# Patient Record
Sex: Male | Born: 1955 | Race: Black or African American | Hispanic: No | Marital: Single | Smoking: Former smoker
Health system: Southern US, Community
[De-identification: ages and names within clinical notes are randomized; demographics above are authoritative.]

## PROBLEM LIST (undated history)

## (undated) DIAGNOSIS — I10 Essential (primary) hypertension: Secondary | ICD-10-CM

## (undated) DIAGNOSIS — K219 Gastro-esophageal reflux disease without esophagitis: Secondary | ICD-10-CM

## (undated) DIAGNOSIS — E785 Hyperlipidemia, unspecified: Secondary | ICD-10-CM

## (undated) DIAGNOSIS — I059 Rheumatic mitral valve disease, unspecified: Secondary | ICD-10-CM

## (undated) DIAGNOSIS — E039 Hypothyroidism, unspecified: Secondary | ICD-10-CM

## (undated) DIAGNOSIS — I330 Acute and subacute infective endocarditis: Secondary | ICD-10-CM

## (undated) DIAGNOSIS — I519 Heart disease, unspecified: Secondary | ICD-10-CM

## (undated) DIAGNOSIS — C801 Malignant (primary) neoplasm, unspecified: Secondary | ICD-10-CM

## (undated) HISTORY — PX: MEDIAN STERNOTOMY: SUR860

---

## 2004-10-11 ENCOUNTER — Ambulatory Visit: Payer: Self-pay | Admitting: Urology

## 2005-11-04 ENCOUNTER — Ambulatory Visit: Payer: Self-pay | Admitting: Gastroenterology

## 2009-02-17 ENCOUNTER — Encounter: Payer: Self-pay | Admitting: Physical Medicine and Rehabilitation

## 2010-04-18 DIAGNOSIS — I059 Rheumatic mitral valve disease, unspecified: Secondary | ICD-10-CM

## 2010-04-18 DIAGNOSIS — I330 Acute and subacute infective endocarditis: Secondary | ICD-10-CM

## 2010-04-18 HISTORY — DX: Acute and subacute infective endocarditis: I33.0

## 2010-04-18 HISTORY — PX: MITRAL VALVE REPLACEMENT: SHX147

## 2010-04-18 HISTORY — DX: Rheumatic mitral valve disease, unspecified: I05.9

## 2011-03-23 ENCOUNTER — Other Ambulatory Visit: Payer: Self-pay | Admitting: Geriatric Medicine

## 2017-09-04 ENCOUNTER — Inpatient Hospital Stay
Admission: EM | Admit: 2017-09-04 | Discharge: 2017-09-04 | DRG: 064 | Disposition: A | Discharge status: Other Acute Inpatient Hospital | Payer: Medicare HMO | Attending: Internal Medicine | Admitting: Internal Medicine

## 2017-09-04 ENCOUNTER — Inpatient Hospital Stay: Payer: Medicare HMO

## 2017-09-04 ENCOUNTER — Inpatient Hospital Stay (HOSPITAL_COMMUNITY)
Admission: AD | Admit: 2017-09-04 | Discharge: 2017-09-15 | DRG: 064 | Disposition: A | Discharge status: Skilled Nursing Facility | Payer: Medicare HMO | Source: Other Acute Inpatient Hospital | Attending: Neurology | Admitting: Neurology

## 2017-09-04 ENCOUNTER — Inpatient Hospital Stay (HOSPITAL_COMMUNITY): Payer: Medicare HMO

## 2017-09-04 ENCOUNTER — Emergency Department: Payer: Medicare HMO

## 2017-09-04 ENCOUNTER — Encounter: Payer: Self-pay | Admitting: Internal Medicine

## 2017-09-04 DIAGNOSIS — R1313 Dysphagia, pharyngeal phase: Secondary | ICD-10-CM | POA: Diagnosis present

## 2017-09-04 DIAGNOSIS — R0682 Tachypnea, not elsewhere classified: Secondary | ICD-10-CM | POA: Diagnosis not present

## 2017-09-04 DIAGNOSIS — I10 Essential (primary) hypertension: Secondary | ICD-10-CM | POA: Diagnosis present

## 2017-09-04 DIAGNOSIS — E870 Hyperosmolality and hypernatremia: Secondary | ICD-10-CM | POA: Diagnosis present

## 2017-09-04 DIAGNOSIS — R1311 Dysphagia, oral phase: Secondary | ICD-10-CM | POA: Diagnosis present

## 2017-09-04 DIAGNOSIS — G9349 Other encephalopathy: Secondary | ICD-10-CM | POA: Diagnosis present

## 2017-09-04 DIAGNOSIS — T17908A Unspecified foreign body in respiratory tract, part unspecified causing other injury, initial encounter: Secondary | ICD-10-CM | POA: Diagnosis not present

## 2017-09-04 DIAGNOSIS — J9600 Acute respiratory failure, unspecified whether with hypoxia or hypercapnia: Secondary | ICD-10-CM | POA: Diagnosis not present

## 2017-09-04 DIAGNOSIS — G935 Compression of brain: Secondary | ICD-10-CM | POA: Diagnosis present

## 2017-09-04 DIAGNOSIS — Z4659 Encounter for fitting and adjustment of other gastrointestinal appliance and device: Secondary | ICD-10-CM | POA: Diagnosis not present

## 2017-09-04 DIAGNOSIS — Z720 Tobacco use: Secondary | ICD-10-CM

## 2017-09-04 DIAGNOSIS — E039 Hypothyroidism, unspecified: Secondary | ICD-10-CM | POA: Diagnosis not present

## 2017-09-04 DIAGNOSIS — R29719 NIHSS score 19: Secondary | ICD-10-CM | POA: Diagnosis not present

## 2017-09-04 DIAGNOSIS — R29810 Facial weakness: Secondary | ICD-10-CM | POA: Diagnosis present

## 2017-09-04 DIAGNOSIS — R569 Unspecified convulsions: Secondary | ICD-10-CM | POA: Diagnosis present

## 2017-09-04 DIAGNOSIS — Z952 Presence of prosthetic heart valve: Secondary | ICD-10-CM | POA: Diagnosis not present

## 2017-09-04 DIAGNOSIS — R001 Bradycardia, unspecified: Secondary | ICD-10-CM

## 2017-09-04 DIAGNOSIS — K219 Gastro-esophageal reflux disease without esophagitis: Secondary | ICD-10-CM | POA: Diagnosis present

## 2017-09-04 DIAGNOSIS — I6502 Occlusion and stenosis of left vertebral artery: Secondary | ICD-10-CM | POA: Diagnosis not present

## 2017-09-04 DIAGNOSIS — R059 Cough, unspecified: Secondary | ICD-10-CM

## 2017-09-04 DIAGNOSIS — E785 Hyperlipidemia, unspecified: Secondary | ICD-10-CM | POA: Diagnosis present

## 2017-09-04 DIAGNOSIS — Z01818 Encounter for other preprocedural examination: Secondary | ICD-10-CM

## 2017-09-04 DIAGNOSIS — Z79899 Other long term (current) drug therapy: Secondary | ICD-10-CM | POA: Diagnosis not present

## 2017-09-04 DIAGNOSIS — I2510 Atherosclerotic heart disease of native coronary artery without angina pectoris: Secondary | ICD-10-CM | POA: Diagnosis present

## 2017-09-04 DIAGNOSIS — I639 Cerebral infarction, unspecified: Principal | ICD-10-CM

## 2017-09-04 DIAGNOSIS — I5189 Other ill-defined heart diseases: Secondary | ICD-10-CM

## 2017-09-04 DIAGNOSIS — I6340 Cerebral infarction due to embolism of unspecified cerebral artery: Secondary | ICD-10-CM | POA: Diagnosis present

## 2017-09-04 DIAGNOSIS — R05 Cough: Secondary | ICD-10-CM

## 2017-09-04 DIAGNOSIS — E440 Moderate protein-calorie malnutrition: Secondary | ICD-10-CM

## 2017-09-04 DIAGNOSIS — Z452 Encounter for adjustment and management of vascular access device: Secondary | ICD-10-CM

## 2017-09-04 DIAGNOSIS — E876 Hypokalemia: Secondary | ICD-10-CM | POA: Diagnosis present

## 2017-09-04 DIAGNOSIS — G8191 Hemiplegia, unspecified affecting right dominant side: Secondary | ICD-10-CM | POA: Diagnosis not present

## 2017-09-04 DIAGNOSIS — J9601 Acute respiratory failure with hypoxia: Secondary | ICD-10-CM | POA: Diagnosis present

## 2017-09-04 DIAGNOSIS — N179 Acute kidney failure, unspecified: Secondary | ICD-10-CM | POA: Diagnosis not present

## 2017-09-04 DIAGNOSIS — H5500 Unspecified nystagmus: Secondary | ICD-10-CM | POA: Diagnosis present

## 2017-09-04 DIAGNOSIS — D72829 Elevated white blood cell count, unspecified: Secondary | ICD-10-CM | POA: Diagnosis not present

## 2017-09-04 DIAGNOSIS — J9690 Respiratory failure, unspecified, unspecified whether with hypoxia or hypercapnia: Secondary | ICD-10-CM

## 2017-09-04 DIAGNOSIS — I6312 Cerebral infarction due to embolism of basilar artery: Secondary | ICD-10-CM | POA: Diagnosis not present

## 2017-09-04 DIAGNOSIS — J690 Pneumonitis due to inhalation of food and vomit: Secondary | ICD-10-CM | POA: Diagnosis not present

## 2017-09-04 DIAGNOSIS — H5010 Unspecified exotropia: Secondary | ICD-10-CM | POA: Diagnosis not present

## 2017-09-04 DIAGNOSIS — Z7982 Long term (current) use of aspirin: Secondary | ICD-10-CM

## 2017-09-04 DIAGNOSIS — G936 Cerebral edema: Secondary | ICD-10-CM

## 2017-09-04 DIAGNOSIS — R29722 NIHSS score 22: Secondary | ICD-10-CM | POA: Diagnosis present

## 2017-09-04 DIAGNOSIS — I63441 Cerebral infarction due to embolism of right cerebellar artery: Secondary | ICD-10-CM

## 2017-09-04 DIAGNOSIS — R292 Abnormal reflex: Secondary | ICD-10-CM | POA: Diagnosis not present

## 2017-09-04 DIAGNOSIS — R739 Hyperglycemia, unspecified: Secondary | ICD-10-CM | POA: Diagnosis not present

## 2017-09-04 DIAGNOSIS — I63442 Cerebral infarction due to embolism of left cerebellar artery: Secondary | ICD-10-CM | POA: Diagnosis not present

## 2017-09-04 HISTORY — DX: Gastro-esophageal reflux disease without esophagitis: K21.9

## 2017-09-04 HISTORY — DX: Heart disease, unspecified: I51.9

## 2017-09-04 HISTORY — DX: Hyperlipidemia, unspecified: E78.5

## 2017-09-04 HISTORY — DX: Essential (primary) hypertension: I10

## 2017-09-04 HISTORY — DX: Rheumatic mitral valve disease, unspecified: I05.9

## 2017-09-04 HISTORY — DX: Hypothyroidism, unspecified: E03.9

## 2017-09-04 HISTORY — DX: Acute and subacute infective endocarditis: I33.0

## 2017-09-04 LAB — URINE DRUG SCREEN, QUALITATIVE (ARMC ONLY)
Amphetamines, Ur Screen: NOT DETECTED
Barbiturates, Ur Screen: NOT DETECTED
Benzodiazepine, Ur Scrn: POSITIVE — AB
CANNABINOID 50 NG, UR ~~LOC~~: NOT DETECTED
COCAINE METABOLITE, UR ~~LOC~~: NOT DETECTED
MDMA (ECSTASY) UR SCREEN: NOT DETECTED
Methadone Scn, Ur: NOT DETECTED
Opiate, Ur Screen: NOT DETECTED
Phencyclidine (PCP) Ur S: NOT DETECTED
TRICYCLIC, UR SCREEN: NOT DETECTED

## 2017-09-04 LAB — CBC WITH DIFFERENTIAL/PLATELET
ABS IMMATURE GRANULOCYTES: 0.1 10*3/uL (ref 0.0–0.1)
BASOS PCT: 1 %
BASOS PCT: 0 %
Basophils Absolute: 0.1 10*3/uL (ref 0–0.1)
Basophils Absolute: 0 10*3/uL (ref 0.0–0.1)
EOS ABS: 0 10*3/uL (ref 0.0–0.7)
Eosinophils Absolute: 0 10*3/uL (ref 0–0.7)
Eosinophils Relative: 0 %
Eosinophils Relative: 0 %
HCT: 46.5 % (ref 39.0–52.0)
HEMATOCRIT: 50.2 % (ref 40.0–52.0)
HEMOGLOBIN: 16.9 g/dL (ref 13.0–18.0)
Hemoglobin: 15.5 g/dL (ref 13.0–17.0)
IMMATURE GRANULOCYTES: 0 %
LYMPHS ABS: 1.5 10*3/uL (ref 1.0–3.6)
LYMPHS ABS: 1.3 10*3/uL (ref 0.7–4.0)
Lymphocytes Relative: 11 %
Lymphocytes Relative: 9 %
MCH: 30 pg (ref 26.0–34.0)
MCH: 29.6 pg (ref 26.0–34.0)
MCHC: 33.6 g/dL (ref 32.0–36.0)
MCHC: 33.3 g/dL (ref 30.0–36.0)
MCV: 89.2 fL (ref 80.0–100.0)
MCV: 88.7 fL (ref 78.0–100.0)
MONOS PCT: 9 %
Monocytes Absolute: 1.2 10*3/uL — ABNORMAL HIGH (ref 0.2–1.0)
Monocytes Absolute: 1.7 10*3/uL — ABNORMAL HIGH (ref 0.1–1.0)
Monocytes Relative: 11 %
NEUTROS ABS: 11 10*3/uL — AB (ref 1.4–6.5)
NEUTROS ABS: 11.5 10*3/uL — AB (ref 1.7–7.7)
NEUTROS PCT: 79 %
NEUTROS PCT: 80 %
PLATELETS: 156 10*3/uL (ref 150–400)
Platelets: 162 10*3/uL (ref 150–440)
RBC: 5.62 MIL/uL (ref 4.40–5.90)
RBC: 5.24 MIL/uL (ref 4.22–5.81)
RDW: 14.5 % (ref 11.5–14.5)
RDW: 14.7 % (ref 11.5–15.5)
WBC: 13.7 10*3/uL — ABNORMAL HIGH (ref 3.8–10.6)
WBC: 14.6 10*3/uL — AB (ref 4.0–10.5)

## 2017-09-04 LAB — GLUCOSE, CAPILLARY
GLUCOSE-CAPILLARY: 119 mg/dL — AB (ref 65–99)
Glucose-Capillary: 108 mg/dL — ABNORMAL HIGH (ref 65–99)
Glucose-Capillary: 111 mg/dL — ABNORMAL HIGH (ref 65–99)

## 2017-09-04 LAB — COMPREHENSIVE METABOLIC PANEL
ALBUMIN: 4.1 g/dL (ref 3.5–5.0)
ALK PHOS: 60 U/L (ref 38–126)
ALK PHOS: 59 U/L (ref 38–126)
ALT: 24 U/L (ref 17–63)
ALT: 24 U/L (ref 17–63)
ANION GAP: 14 (ref 5–15)
ANION GAP: 11 (ref 5–15)
AST: 36 U/L (ref 15–41)
AST: 44 U/L — ABNORMAL HIGH (ref 15–41)
Albumin: 3.5 g/dL (ref 3.5–5.0)
BILIRUBIN TOTAL: 1.3 mg/dL — AB (ref 0.3–1.2)
BILIRUBIN TOTAL: 1.1 mg/dL (ref 0.3–1.2)
BUN: 24 mg/dL — AB (ref 6–20)
BUN: 20 mg/dL (ref 6–20)
CALCIUM: 9.4 mg/dL (ref 8.9–10.3)
CALCIUM: 8.8 mg/dL — AB (ref 8.9–10.3)
CO2: 24 mmol/L (ref 22–32)
CO2: 24 mmol/L (ref 22–32)
CREATININE: 1.28 mg/dL — AB (ref 0.61–1.24)
CREATININE: 1.26 mg/dL — AB (ref 0.61–1.24)
Chloride: 105 mmol/L (ref 101–111)
Chloride: 111 mmol/L (ref 101–111)
GFR calc Af Amer: >60 mL/min (ref >60)
GFR calc non Af Amer: 58 mL/min — ABNORMAL LOW (ref >60)
GFR calc non Af Amer: 59 mL/min — ABNORMAL LOW (ref >60)
GLUCOSE: 104 mg/dL — AB (ref 65–99)
Glucose, Bld: 122 mg/dL — ABNORMAL HIGH (ref 65–99)
Potassium: 3.4 mmol/L — ABNORMAL LOW (ref 3.5–5.1)
Potassium: 3.5 mmol/L (ref 3.5–5.1)
Sodium: 143 mmol/L (ref 135–145)
Sodium: 146 mmol/L — ABNORMAL HIGH (ref 135–145)
TOTAL PROTEIN: 6.9 g/dL (ref 6.5–8.1)
Total Protein: 5.8 g/dL — ABNORMAL LOW (ref 6.5–8.1)

## 2017-09-04 LAB — LACTIC ACID, PLASMA
Lactic Acid, Venous: 1.5 mmol/L (ref 0.5–1.9)
Lactic Acid, Venous: 1.5 mmol/L (ref 0.5–1.9)

## 2017-09-04 LAB — APTT: aPTT: 24 seconds (ref 24–36)

## 2017-09-04 LAB — URINALYSIS, COMPLETE (UACMP) WITH MICROSCOPIC
BACTERIA UA: NONE SEEN
Bilirubin Urine: NEGATIVE
GLUCOSE, UA: NEGATIVE mg/dL
Ketones, ur: 5 mg/dL — AB
LEUKOCYTES UA: NEGATIVE
Nitrite: NEGATIVE
Protein, ur: NEGATIVE mg/dL
SQUAMOUS EPITHELIAL / LPF: NONE SEEN (ref 0–5)
Specific Gravity, Urine: 1.015 (ref 1.005–1.030)
pH: 6 (ref 5.0–8.0)

## 2017-09-04 LAB — HEMOGLOBIN A1C
Hgb A1c MFr Bld: 5.6 % (ref 4.8–5.6)
Mean Plasma Glucose: 114.02 mg/dL

## 2017-09-04 LAB — POCT I-STAT 3, ART BLOOD GAS (G3+)
Bicarbonate: 24.6 mmol/L (ref 20.0–28.0)
O2 Saturation: 98 %
PCO2 ART: 40.7 mmHg (ref 32.0–48.0)
PO2 ART: 108 mmHg (ref 83.0–108.0)
Patient temperature: 98.2
TCO2: 26 mmol/L (ref 22–32)
pH, Arterial: 7.389 (ref 7.350–7.450)

## 2017-09-04 LAB — PROTIME-INR
INR: 0.94
PROTHROMBIN TIME: 12.5 s (ref 11.4–15.2)

## 2017-09-04 LAB — PHOSPHORUS
PHOSPHORUS: 3.3 mg/dL (ref 2.5–4.6)
Phosphorus: 4.3 mg/dL (ref 2.5–4.6)

## 2017-09-04 LAB — TROPONIN I
Troponin I: <0.03 ng/mL (ref <0.03)
Troponin I: <0.03 ng/mL (ref <0.03)

## 2017-09-04 LAB — SALICYLATE LEVEL

## 2017-09-04 LAB — BLOOD GAS, ARTERIAL
ACID-BASE DEFICIT: 0.2 mmol/L (ref 0.0–2.0)
BICARBONATE: 25.4 mmol/L (ref 20.0–28.0)
FIO2: 0.4
MECHVT: 450 mL
Mechanical Rate: 20
O2 Saturation: 97.9 %
PEEP: 5 cmH2O
PH ART: 7.37 (ref 7.350–7.450)
Patient temperature: 37
pCO2 arterial: 44 mmHg (ref 32.0–48.0)
pO2, Arterial: 106 mmHg (ref 83.0–108.0)

## 2017-09-04 LAB — MAGNESIUM
Magnesium: 1.6 mg/dL — ABNORMAL LOW (ref 1.7–2.4)
Magnesium: 2.6 mg/dL — ABNORMAL HIGH (ref 1.7–2.4)

## 2017-09-04 LAB — MRSA PCR SCREENING: MRSA BY PCR: NEGATIVE

## 2017-09-04 LAB — ETHANOL: Alcohol, Ethyl (B): <10 mg/dL (ref <10)

## 2017-09-04 LAB — ACETAMINOPHEN LEVEL: Acetaminophen (Tylenol), Serum: <10 ug/mL — ABNORMAL LOW (ref 10–30)

## 2017-09-04 LAB — SODIUM: Sodium: 142 mmol/L (ref 135–145)

## 2017-09-04 LAB — PROCALCITONIN: Procalcitonin: <0.1 ng/mL

## 2017-09-04 MED ORDER — SODIUM CHLORIDE 3 % IV SOLN
INTRAVENOUS | Status: DC
  Filled 2017-09-04 (×3): qty 500

## 2017-09-04 MED ORDER — MAGNESIUM SULFATE 4 GM/100ML IV SOLN
4.0000 g | Freq: Once | INTRAVENOUS | Status: AC
  Administered 2017-09-04: 4 g via INTRAVENOUS
  Filled 2017-09-04: qty 100

## 2017-09-04 MED ORDER — FENTANYL 2500MCG IN NS 250ML (10MCG/ML) PREMIX INFUSION: 25.0000 ug/h | INTRAVENOUS | Status: DC

## 2017-09-04 MED ORDER — ACETAMINOPHEN 650 MG RE SUPP: 650.0000 mg | RECTAL | Status: DC | PRN

## 2017-09-04 MED ORDER — FREE WATER: 100.0000 mL | Freq: Four times a day (QID) | Status: DC

## 2017-09-04 MED ORDER — MIDAZOLAM HCL 2 MG/2ML IJ SOLN
2.0000 mg | INTRAMUSCULAR | Status: DC | PRN
  Administered 2017-09-04 – 2017-09-05 (×2): 2 mg via INTRAVENOUS
  Filled 2017-09-04 (×2): qty 2

## 2017-09-04 MED ORDER — FAMOTIDINE 40 MG/5ML PO SUSR
20.0000 mg | Freq: Two times a day (BID) | ORAL | Status: DC
  Administered 2017-09-04 – 2017-09-06 (×4): 20 mg
  Filled 2017-09-04 (×5): qty 2.5

## 2017-09-04 MED ORDER — CHLORHEXIDINE GLUCONATE 0.12% ORAL RINSE (MEDLINE KIT)
15.0000 mL | Freq: Two times a day (BID) | OROMUCOSAL | Status: DC
  Administered 2017-09-04: 15 mL via OROMUCOSAL

## 2017-09-04 MED ORDER — ACETAMINOPHEN 325 MG PO TABS: 650.0000 mg | ORAL_TABLET | ORAL | Status: DC | PRN

## 2017-09-04 MED ORDER — VITAL HIGH PROTEIN PO LIQD: 1000.0000 mL | ORAL | Status: DC

## 2017-09-04 MED ORDER — CHLORHEXIDINE GLUCONATE 0.12% ORAL RINSE (MEDLINE KIT)
15.0000 mL | Freq: Two times a day (BID) | OROMUCOSAL | Status: DC
  Administered 2017-09-04 – 2017-09-15 (×22): 15 mL via OROMUCOSAL

## 2017-09-04 MED ORDER — MIDAZOLAM HCL 2 MG/2ML IJ SOLN
2.0000 mg | INTRAMUSCULAR | Status: DC | PRN
  Administered 2017-09-06 – 2017-09-07 (×2): 2 mg via INTRAVENOUS
  Filled 2017-09-04 (×2): qty 2

## 2017-09-04 MED ORDER — ACETAMINOPHEN 160 MG/5ML PO SOLN
650.0000 mg | ORAL | Status: DC | PRN
  Filled 2017-09-04: qty 20.3

## 2017-09-04 MED ORDER — ORAL CARE MOUTH RINSE
15.0000 mL | OROMUCOSAL | Status: DC
  Administered 2017-09-04 – 2017-09-08 (×39): 15 mL via OROMUCOSAL

## 2017-09-04 MED ORDER — ONDANSETRON HCL 4 MG/2ML IJ SOLN
4.0000 mg | Freq: Four times a day (QID) | INTRAMUSCULAR | Status: DC | PRN
  Administered 2017-09-11: 4 mg via INTRAVENOUS
  Filled 2017-09-04: qty 2

## 2017-09-04 MED ORDER — ORAL CARE MOUTH RINSE
15.0000 mL | OROMUCOSAL | Status: DC
  Administered 2017-09-04 (×2): 15 mL via OROMUCOSAL

## 2017-09-04 MED ORDER — SENNOSIDES-DOCUSATE SODIUM 8.6-50 MG PO TABS
1.0000 | ORAL_TABLET | Freq: Every evening | ORAL | Status: DC | PRN
  Administered 2017-09-06: 1 via ORAL
  Filled 2017-09-04: qty 1

## 2017-09-04 MED ORDER — SODIUM CHLORIDE 3 % IV SOLN
INTRAVENOUS | Status: AC
  Administered 2017-09-04 – 2017-09-05 (×3): 75 mL/h via INTRAVENOUS
  Administered 2017-09-05: 50 mL/h via INTRAVENOUS
  Filled 2017-09-04 (×5): qty 500

## 2017-09-04 MED ORDER — SODIUM CHLORIDE 0.9 % IV SOLN: INTRAVENOUS | Status: DC

## 2017-09-04 MED ORDER — ADULT MULTIVITAMIN LIQUID CH
15.0000 mL | Freq: Every day | ORAL | Status: DC
  Administered 2017-09-04: 15 mL
  Filled 2017-09-04: qty 15

## 2017-09-04 MED ORDER — SODIUM CHLORIDE 0.9 % IV SOLN
INTRAVENOUS | Status: DC
  Administered 2017-09-04: 09:00:00 via INTRAVENOUS

## 2017-09-04 MED ORDER — FENTANYL BOLUS VIA INFUSION
50.0000 ug | INTRAVENOUS | Status: DC | PRN
  Filled 2017-09-04: qty 50

## 2017-09-04 MED ORDER — FAMOTIDINE 20 MG PO TABS
20.0000 mg | ORAL_TABLET | Freq: Two times a day (BID) | ORAL | Status: DC
  Administered 2017-09-04: 20 mg
  Filled 2017-09-04: qty 1

## 2017-09-04 MED ORDER — SODIUM CHLORIDE 0.9 % IV SOLN: 250.0000 mL | INTRAVENOUS | Status: DC | PRN

## 2017-09-04 MED ORDER — ORAL CARE MOUTH RINSE
15.0000 mL | OROMUCOSAL | Status: DC
  Administered 2017-09-04: 15 mL via OROMUCOSAL

## 2017-09-04 MED ORDER — KETAMINE HCL 10 MG/ML IJ SOLN
150.0000 mg | Freq: Once | INTRAMUSCULAR | Status: AC
  Administered 2017-09-04: 150 mg via INTRAVENOUS

## 2017-09-04 MED ORDER — DOCUSATE SODIUM 50 MG/5ML PO LIQD
100.0000 mg | Freq: Two times a day (BID) | ORAL | Status: DC
  Administered 2017-09-04 – 2017-09-11 (×12): 100 mg
  Filled 2017-09-04 (×13): qty 10

## 2017-09-04 MED ORDER — LEVETIRACETAM IN NACL 1000 MG/100ML IV SOLN
1000.0000 mg | Freq: Two times a day (BID) | INTRAVENOUS | Status: DC
  Filled 2017-09-04 (×2): qty 100

## 2017-09-04 MED ORDER — ACETAMINOPHEN 160 MG/5ML PO SOLN
650.0000 mg | ORAL | Status: DC | PRN
  Administered 2017-09-06 – 2017-09-09 (×2): 650 mg
  Filled 2017-09-04 (×2): qty 20.3

## 2017-09-04 MED ORDER — WARFARIN - PHARMACIST DOSING INPATIENT: Freq: Every day | Status: DC

## 2017-09-04 MED ORDER — SUCCINYLCHOLINE CHLORIDE 20 MG/ML IJ SOLN
120.0000 mg | Freq: Once | INTRAMUSCULAR | Status: AC
  Administered 2017-09-04: 120 mg via INTRAVENOUS

## 2017-09-04 MED ORDER — ACETAMINOPHEN 325 MG PO TABS
650.0000 mg | ORAL_TABLET | ORAL | Status: DC | PRN
  Filled 2017-09-04: qty 2

## 2017-09-04 MED ORDER — DOCUSATE SODIUM 50 MG/5ML PO LIQD: 100.0000 mg | Freq: Two times a day (BID) | ORAL | Status: DC | PRN

## 2017-09-04 MED ORDER — HEPARIN SODIUM (PORCINE) 5000 UNIT/ML IJ SOLN: 5000.0000 [IU] | Freq: Three times a day (TID) | INTRAMUSCULAR | Status: DC

## 2017-09-04 MED ORDER — FENTANYL CITRATE (PF) 100 MCG/2ML IJ SOLN
50.0000 ug | Freq: Once | INTRAMUSCULAR | Status: AC
  Administered 2017-09-04: 50 ug via INTRAVENOUS

## 2017-09-04 MED ORDER — VITAL AF 1.2 CAL PO LIQD
1000.0000 mL | ORAL | Status: DC
  Administered 2017-09-04: 1000 mL

## 2017-09-04 MED ORDER — PROPOFOL 1000 MG/100ML IV EMUL
5.0000 ug/kg/min | Freq: Once | INTRAVENOUS | Status: AC
  Administered 2017-09-04: 5 ug/kg/min via INTRAVENOUS

## 2017-09-04 MED ORDER — POTASSIUM CHLORIDE 10 MEQ/100ML IV SOLN
10.0000 meq | INTRAVENOUS | Status: AC
  Administered 2017-09-04 – 2017-09-05 (×4): 10 meq via INTRAVENOUS
  Filled 2017-09-04 (×4): qty 100

## 2017-09-04 MED ORDER — FREE WATER
100.0000 mL | Freq: Four times a day (QID) | Status: DC
  Administered 2017-09-04: 100 mL

## 2017-09-04 MED ORDER — INSULIN ASPART 100 UNIT/ML ~~LOC~~ SOLN: 0.0000 [IU] | SUBCUTANEOUS | Status: DC

## 2017-09-04 MED ORDER — KETAMINE HCL 10 MG/ML IJ SOLN
INTRAMUSCULAR | Status: AC
  Filled 2017-09-04: qty 1

## 2017-09-04 MED ORDER — WARFARIN SODIUM 7.5 MG PO TABS: 7.5000 mg | ORAL_TABLET | Freq: Once | ORAL | Status: DC

## 2017-09-04 MED ORDER — NALOXONE HCL 2 MG/2ML IJ SOSY
PREFILLED_SYRINGE | INTRAMUSCULAR | Status: AC
  Filled 2017-09-04: qty 2

## 2017-09-04 MED ORDER — FENTANYL 2500MCG IN NS 250ML (10MCG/ML) PREMIX INFUSION
25.0000 ug/h | INTRAVENOUS | Status: DC
  Administered 2017-09-04 – 2017-09-06 (×2): 100 ug/h via INTRAVENOUS
  Filled 2017-09-04 (×2): qty 250

## 2017-09-04 MED ORDER — ATORVASTATIN CALCIUM 40 MG PO TABS: 40.0000 mg | ORAL_TABLET | Freq: Every day | ORAL | Status: DC

## 2017-09-04 MED ORDER — LEVETIRACETAM IN NACL 1000 MG/100ML IV SOLN
1000.0000 mg | Freq: Once | INTRAVENOUS | Status: AC
  Administered 2017-09-04: 1000 mg via INTRAVENOUS
  Filled 2017-09-04: qty 100

## 2017-09-04 MED ORDER — STROKE: EARLY STAGES OF RECOVERY BOOK
Freq: Once | Status: AC
  Administered 2017-09-04: 22:00:00
  Filled 2017-09-04: qty 1

## 2017-09-04 MED ORDER — PIPERACILLIN-TAZOBACTAM 3.375 G IVPB 30 MIN
3.3750 g | Freq: Once | INTRAVENOUS | Status: AC
  Administered 2017-09-04: 3.375 g via INTRAVENOUS
  Filled 2017-09-04: qty 50

## 2017-09-04 MED ORDER — INSULIN ASPART 100 UNIT/ML ~~LOC~~ SOLN
1.0000 [IU] | SUBCUTANEOUS | Status: DC
  Administered 2017-09-07 – 2017-09-09 (×2): 1 [IU] via SUBCUTANEOUS
  Administered 2017-09-09: 2 [IU] via SUBCUTANEOUS
  Administered 2017-09-09: 1 [IU] via SUBCUTANEOUS
  Administered 2017-09-09: 2 [IU] via SUBCUTANEOUS
  Administered 2017-09-09 – 2017-09-12 (×9): 1 [IU] via SUBCUTANEOUS

## 2017-09-04 MED ORDER — PROPOFOL 1000 MG/100ML IV EMUL
INTRAVENOUS | Status: AC
  Filled 2017-09-04: qty 100

## 2017-09-04 MED ORDER — FENTANYL CITRATE (PF) 100 MCG/2ML IJ SOLN: 50.0000 ug | Freq: Once | INTRAMUSCULAR | Status: DC

## 2017-09-04 MED ORDER — MANNITOL 20 % IV SOLN
50.0000 g | Freq: Once | Status: AC
  Administered 2017-09-04: 50 g via INTRAVENOUS
  Filled 2017-09-04: qty 250

## 2017-09-04 MED ORDER — ATORVASTATIN CALCIUM 20 MG PO TABS: 40.0000 mg | ORAL_TABLET | Freq: Every day | ORAL | Status: DC

## 2017-09-04 MED ORDER — LORAZEPAM 2 MG/ML IJ SOLN: 1.0000 mg | INTRAMUSCULAR | Status: DC | PRN

## 2017-09-04 MED ORDER — HEPARIN SODIUM (PORCINE) 5000 UNIT/ML IJ SOLN
5000.0000 [IU] | Freq: Three times a day (TID) | INTRAMUSCULAR | Status: DC
  Administered 2017-09-04: 5000 [IU] via SUBCUTANEOUS
  Filled 2017-09-04: qty 1

## 2017-09-04 MED ORDER — STROKE: EARLY STAGES OF RECOVERY BOOK
Freq: Once | Status: AC
  Administered 2017-09-04: 13:00:00

## 2017-09-04 MED ORDER — POTASSIUM CHLORIDE 20 MEQ PO PACK
40.0000 meq | PACK | Freq: Once | ORAL | Status: AC
  Administered 2017-09-04: 40 meq via ORAL
  Filled 2017-09-04: qty 2

## 2017-09-04 MED ORDER — SODIUM CHLORIDE 23.4 % INJECTION (4 MEQ/ML) FOR IV ADMINISTRATION
60.0000 meq | Freq: Once | INTRAVENOUS | Status: DC
  Filled 2017-09-04: qty 30

## 2017-09-04 NOTE — Progress Notes (Signed)
PT Cancellation Note  Patient Details Name: FARREN NELLES MRN: [id number] DOB: 16-Aug-1955   Cancelled Treatment:    Reason Eval/Treat Not Completed: Patient not medically ready. Order received.  Chart reviewed and RN consulted.  Pt intubated this morning and very limited in mobility.  Will hold until pt is medically appropriate.   Roxanne Gates, PT, DPT 09/04/2017, 1:39 PM

## 2017-09-04 NOTE — Progress Notes (Signed)
SLP Cancellation Note  Patient Details Name: CHANE COWDEN MRN: [id number] DOB: 1955-07-01   Cancelled treatment:       Reason Eval/Treat Not Completed: Patient not medically ready(chart reviewed; pt is orally intubated, sleepy). ST services will f/u in the morning when pt is more awake to see if a communication board could be helpful for communication. NSG updated.    Orinda Kenner, MS, CCC-SLP [facility]anilla 09/04/2017, 5:15 PM

## 2017-09-04 NOTE — Consult Note (Signed)
PULMONARY / CRITICAL CARE MEDICINE   Name: Dillon Garcia MRN: [id number] DOB: May 14, 1955    ADMISSION DATE:  09/04/2017 CONSULTATION DATE: 09/04/17  REFERRING MD: Dr Rory Percy  CHIEF COMPLAINT: CVA, Vent managment  HISTORY OF PRESENT ILLNESS:   65yoM with hx HTN, HLD, CAD, GERD, and Active tobacco abuse, presents with an acute cerebellar CVA. Reportedly he was found unresponsive in his car outside [place] with questionable seizure activity. He was taken initially to [place] where he was seen to have right right facial droop, and Head CT showed an acute left cerebellar infarct. He was initially following simple commands, but his mental status then began to decline. He was intubated and started on 3% saline. PCCM now consulted for vent management.   PAST MEDICAL HISTORY :  He  has a past medical history of Heart disease and HTN (hypertension).  PAST SURGICAL HISTORY: He  has a past surgical history that includes Median sternotomy.  Not on File  No current facility-administered medications on file prior to encounter.    Current Outpatient Medications on File Prior to Encounter  Medication Sig  . amLODipine (NORVASC) 5 MG tablet Take 5 mg by mouth daily as needed (bp > 140/90).  Marland Kitchen aspirin EC 81 MG tablet Take 81 mg by mouth daily.  Marland Kitchen atorvastatin (LIPITOR) 40 MG tablet Take 40 mg by mouth daily.  . famotidine (PEPCID) 10 MG tablet Take 10 mg by mouth at bedtime as needed for heartburn or indigestion.  Marland Kitchen lisinopril-hydrochlorothiazide (PRINZIDE,ZESTORETIC) 20-12.5 MG tablet Take 1 tablet by mouth 2 (two) times daily.  . metoprolol succinate (TOPROL-XL) 100 MG 24 hr tablet Take 100 mg by mouth daily. Take with or immediately following a meal.   FAMILY HISTORY:  His has no family status information on file.   SOCIAL HISTORY: He  is a current daily smoker (per care everywhere)  REVIEW OF SYSTEMS:   Review of Systems  Unable to perform ROS: Critical illness   SUBJECTIVE:   Intubated and Sedated   VITAL SIGNS: BP (!) 129/91   Pulse 80   Temp 98.2 F (36.8 C) (Axillary)   Resp 20   Ht 5\' 5"  (1.651 m)   Wt 57 kg (125 lb 10.6 oz)   SpO2 100%   BMI 20.91 kg/m   VENTILATOR SETTINGS: Vent Mode: PRVC FiO2 (%):  [30 %-40 %] 30 % Set Rate:  [20 bmp] 20 bmp Vt Set:  [450 mL-490 mL] 490 mL PEEP:  [5 cmH20] 5 cmH20 Plateau Pressure:  [12 cmH20-16 cmH20] 12 cmH20  INTAKE / OUTPUT: No intake/output data recorded.  PHYSICAL EXAMINATION: General: WDWN Adult male, intubated, critically ill Neuro: Sleepy, awakens to voice, obeying commands, PERRL, flaccid on right, movement in LUE and LLE HEENT: Orally intubated, MM moist, OP clear Cardiovascular: RRR no m/r/g Lungs: CTA b/l Abdomen: Soft NTND Musculoskeletal: no LE edema  Skin: no rashes   LABS:  BMET Recent Labs  Lab 09/04/17 0524 09/04/17 1834  NA 143 142  K 3.4*  --   CL 105  --   CO2 24  --   BUN 24*  --   CREATININE 1.28*  --   GLUCOSE 104*  --    Electrolytes Recent Labs  Lab 09/04/17 0524 09/04/17 0939  CALCIUM 9.4  --   MG  --  1.6*  PHOS 4.3  --    CBC Recent Labs  Lab 09/04/17 0524 09/04/17 2118  WBC 13.7* 14.6*  HGB 16.9 15.5  HCT 50.2  46.5  PLT 162 156   Coag's Recent Labs  Lab 09/04/17 0524  APTT 24  INR 0.94   Sepsis Markers Recent Labs  Lab 09/04/17 0602 09/04/17 0935  LATICACIDVEN 1.5 1.5   ABG Recent Labs  Lab 09/04/17 0529  PHART 7.37  PCO2ART 44  PO2ART 106   Liver Enzymes Recent Labs  Lab 09/04/17 0524  AST 36  ALT 24  ALKPHOS 60  BILITOT 1.3*  ALBUMIN 4.1   Cardiac Enzymes Recent Labs  Lab 09/04/17 0524  TROPONINI <0.03   Glucose Recent Labs  Lab 09/04/17 0818 09/04/17 1657  GLUCAP 108* 111*   Imaging Ct Head Wo Contrast  Result Date: 09/04/2017 CLINICAL DATA:  Initial evaluation for acute unresponsiveness. EXAM: CT HEAD WITHOUT CONTRAST TECHNIQUE: Contiguous axial images were obtained from the base of the skull  through the vertex without intravenous contrast. COMPARISON:  None. FINDINGS: Brain: Generalized age-related cerebral atrophy. Mild chronic small vessel ischemic change present within the hemispheric cerebral white matter. Confluent hypodensity involving the superior left cerebellar hemisphere, consistent with evolving acute ischemic left superior cerebellar artery territory infarct. No associated hemorrhage. No significant mass effect at this time. Adjacent fourth ventricle remains patent. No inferior herniation. No other evidence for acute large vessel territory infarct. No intracranial hemorrhage. No mass lesion or midline shift. No hydrocephalus. No extra-axial fluid collection. Vascular: No hyperdense vessel. Scattered vascular calcifications noted within the carotid siphons. Skull: Scalp soft tissues and calvarium within normal limits. Sinuses/Orbits: Globes and orbital soft tissues within normal limits. Small air-fluid level noted within the right maxillary sinus. Scattered mucosal thickening within the ethmoidal air cells. Visualized mastoids are clear. Other: None. IMPRESSION: 1. Evolving acute ischemic infarct involving the superior left cerebellar hemisphere, left superior cerebellar artery territory. No associated hemorrhage or significant mass effect at this time. 2. Age-related cerebral atrophy with mild chronic small vessel ischemic disease. Critical Value/emergent results were called by telephone at the time of interpretation on 09/04/2017 at 5:56 am to Dr. Lurline Hare , who verbally acknowledged these results. Electronically Signed   By: Jeannine Boga M.D.   On: 09/04/2017 05:56   Mr Brain Wo Contrast  Result Date: 09/04/2017 CLINICAL DATA:  Follow-up LEFT cerebellar infarct. Found unresponsive in car at [employer]. Critically ill. History of hypertension. EXAM: MRI HEAD WITHOUT CONTRAST MRA HEAD WITHOUT CONTRAST TECHNIQUE: Multiplanar, multiecho pulse sequences of the brain and surrounding  structures were obtained without intravenous contrast. Angiographic images of the head were obtained using MRA technique without contrast. COMPARISON:  CT HEAD Sep 04, 2017 at 0527 hours FINDINGS: Multiple sequences are moderately motion degraded. MRI HEAD FINDINGS INTRACRANIAL CONTENTS: Confluent reduced diffusion LEFT pons, LEFT cerebellum predominately involving the superior portion. Patchy reduced diffusion medial mesial RIGHT cerebellum. All areas of reduced diffusion demonstrate low ADC values and bright FLAIR signal. No susceptibility artifact to suggest hemorrhage. Fourth ventricle is open though, there is mild upward herniation and narrowed LEFT quadrigeminal cistern. No parenchymal brain volume loss for age. No supratentorial midline shift. No masses. No abnormal extra-axial fluid collections. VASCULAR: T2 bright signal LEFT C1 foramen transversarium corresponding to LEFT vertebral artery. Otherwise normal major intracranial vascular flow voids present at skull base. SKULL AND UPPER CERVICAL SPINE: No abnormal sellar expansion. No suspicious calvarial bone marrow signal. Posterior cervical spine susceptibility artifact likely reflects hardware. Craniocervical junction maintained. SINUSES/ORBITS: Mild paranasal sinus mucosal thickening. Air-fluid level in the pharynx due to life-support lines. Mastoid air cells are well aerated.The included ocular globes and orbital  contents are non-suspicious. OTHER: Life-support lines in place. MRA HEAD FINDINGS ANTERIOR CIRCULATION: Normal flow related enhancement of the included cervical, petrous, cavernous and supraclinoid internal carotid arteries. Patent anterior communicating artery. Patent anterior and middle cerebral arteries, mild luminal irregularity seen with atherosclerosis or motion artifact. No large vessel occlusion, flow limiting stenosis though limited by motion, aneurysm. POSTERIOR CIRCULATION: RIGHT vertebral artery is dominant. For flow related  enhancement LEFT vertebral artery with retrograde flow at vertebrobasilar junction. Patent RIGHT vertebral artery mild luminal irregularity seen with motion artifact or atherosclerosis. No flow limiting stenosis though limited by motion, aneurysm. ANATOMIC VARIANTS: None. Source images and MIP images were reviewed. IMPRESSION: MRI HEAD: 1. Motion degraded examination. 2. 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 Regional mass effect without obstructive hydrocephalus. MRA HEAD: 1. Slow flow versus occluded LEFT vertebral artery. 2. No flow limiting stenosis on this motion degraded examination. Electronically Signed   By: Elon Alas M.D.   On: 09/04/2017 15:33   Dg Pelvis Portable  Result Date: 09/04/2017 CLINICAL DATA:  For MRI clearance. EXAM: PORTABLE PELVIS 1-2 VIEWS COMPARISON:  None. FINDINGS: Foley catheter in place. No metallic devices or foreign bodies are visible. Bones appear normal. Bowel gas pattern is normal. IMPRESSION: Negative exam for metal in the pelvis. Electronically Signed   By: Lorriane Shire M.D.   On: 09/04/2017 09:59   Dg Chest Port 1 View  Result Date: 09/04/2017 CLINICAL DATA:  Intubation. EXAM: PORTABLE CHEST 1 VIEW COMPARISON:  Chest radiograph Sep 12, 2017 at 0532 hours FINDINGS: Endotracheal tube tip projects 6.4 cm above the carina. Nasogastric tube past proximal stomach. Gas distended stomach. Cardiomediastinal silhouette is normal. Calcified aortic arch. Mild bronchitic changes without pleural effusion or focal consolidation. No pneumothorax. Osseous structures are unchanged. IMPRESSION: Endotracheal tube tip projects 6.4 cm above the carina, consider 1-2 cm advancement. Nasogastric tube past proximal stomach, gas distended stomach. Mild bronchitic changes. Aortic Atherosclerosis (ICD10-I70.0). Electronically Signed   By: Elon Alas M.D.   On: 09/04/2017 21:58   Dg Chest Port 1 View  Result Date:  09/04/2017 CLINICAL DATA:  Intubation. EXAM: PORTABLE CHEST 1 VIEW COMPARISON:  None. FINDINGS: Endotracheal tube tip just above the clavicular heads 7.4 cm from the carina. Advancement of 1-2 cm would lead to optimal placement. Post median sternotomy. Heart size is normal. No pulmonary edema. Patchy left infrahilar opacities partially obscured by overlying monitoring devices. No large pleural effusion or pneumothorax. IMPRESSION: 1. Endotracheal tube tip just above the clavicular heads 7.4 cm from the carina, advancement of 1-2 cm would lead to optimal placement. 2. Patchy left infrahilar opacities likely atelectasis, partially obscured by overlying monitoring device. Electronically Signed   By: Jeb Levering M.D.   On: 09/04/2017 06:47   Dg Abd Portable 1v  Result Date: 09/04/2017 CLINICAL DATA:  Screening for MRI. EXAM: PORTABLE ABDOMEN - 1 VIEW COMPARISON:  None. FINDINGS: Portable supine view the abdomen and pelvis shows gaseous distention of the stomach despite the presence of an NG tube with the distal tip positioned in the mid stomach. No gaseous bowel dilatation. Visualized lung bases are unremarkable. The patient does have a temperature probe overlying the inferior pelvis. Otherwise, no findings to suggest metallic foreign body in the abdomen or pelvis. IMPRESSION: 1. Apparent temperature probe overlies the inferior anatomic pelvis and appears to contain metallic wire. 2. Otherwise no unexpected metallic foreign body over the abdomen or pelvis. Electronically Signed   By: Verda Cumins.D.  On: 09/04/2017 10:07   Mr Jodene Nam Head/brain KV Cm  Result Date: 09/04/2017 CLINICAL DATA:  Follow-up LEFT cerebellar infarct. Found unresponsive in car at [employer]. Critically ill. History of hypertension. EXAM: MRI HEAD WITHOUT CONTRAST MRA HEAD WITHOUT CONTRAST TECHNIQUE: Multiplanar, multiecho pulse sequences of the brain and surrounding structures were obtained without intravenous contrast. Angiographic  images of the head were obtained using MRA technique without contrast. COMPARISON:  CT HEAD Sep 04, 2017 at 0527 hours FINDINGS: Multiple sequences are moderately motion degraded. MRI HEAD FINDINGS INTRACRANIAL CONTENTS: Confluent reduced diffusion LEFT pons, LEFT cerebellum predominately involving the superior portion. Patchy reduced diffusion medial mesial RIGHT cerebellum. All areas of reduced diffusion demonstrate low ADC values and bright FLAIR signal. No susceptibility artifact to suggest hemorrhage. Fourth ventricle is open though, there is mild upward herniation and narrowed LEFT quadrigeminal cistern. No parenchymal brain volume loss for age. No supratentorial midline shift. No masses. No abnormal extra-axial fluid collections. VASCULAR: T2 bright signal LEFT C1 foramen transversarium corresponding to LEFT vertebral artery. Otherwise normal major intracranial vascular flow voids present at skull base. SKULL AND UPPER CERVICAL SPINE: No abnormal sellar expansion. No suspicious calvarial bone marrow signal. Posterior cervical spine susceptibility artifact likely reflects hardware. Craniocervical junction maintained. SINUSES/ORBITS: Mild paranasal sinus mucosal thickening. Air-fluid level in the pharynx due to life-support lines. Mastoid air cells are well aerated.The included ocular globes and orbital contents are non-suspicious. OTHER: Life-support lines in place. MRA HEAD FINDINGS ANTERIOR CIRCULATION: Normal flow related enhancement of the included cervical, petrous, cavernous and supraclinoid internal carotid arteries. Patent anterior communicating artery. Patent anterior and middle cerebral arteries, mild luminal irregularity seen with atherosclerosis or motion artifact. No large vessel occlusion, flow limiting stenosis though limited by motion, aneurysm. POSTERIOR CIRCULATION: RIGHT vertebral artery is dominant. For flow related enhancement LEFT vertebral artery with retrograde flow at vertebrobasilar  junction. Patent RIGHT vertebral artery mild luminal irregularity seen with motion artifact or atherosclerosis. No flow limiting stenosis though limited by motion, aneurysm. ANATOMIC VARIANTS: None. Source images and MIP images were reviewed. IMPRESSION: MRI HEAD: 1. Motion degraded examination. 2. 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 Regional mass effect without obstructive hydrocephalus. MRA HEAD: 1. Slow flow versus occluded LEFT vertebral artery. 2. No flow limiting stenosis on this motion degraded examination. Electronically Signed   By: Elon Alas M.D.   On: 09/04/2017 15:33   CULTURES: Sputum culture 5/20: pending   ANTIBIOTICS: None   SIGNIFICANT EVENTS: 5/20: admitted to [facility] with acute cerebellar CVA, requiring intubation. Transferred to [facility] and started on 3% saline.   LINES/TUBES: IJ TLC 5/20 >> OG tube 5/20 >> Foley catheter 5/20 >> PIV's  DISCUSSION: 61yoM with hx HTN, HLD, and CAD, presents with an acute cerebellar CVA. Reportedly he was found unresponsive in his car outside [place] with questionable seizure activity. He was taken initially to [place] where he was seen to have right right facial droop, and Head CT showed an acute large left cerebellar infarct. He was initially following simple commands, but his mental status then began to decline. He was intubated and started on 3% saline. PCCM now consulted for vent management.   ASSESSMENT / PLAN:  PULMONARY 1. Acute Hypoxic Respiratory Failure; Aspiration pneumonia: - continue mechanical ventilation.  - CXR on my review shows ETT is high; advance 2cm (from 23 to 25cm at the teeth) - moderate amount creamy secretions on suctioning of ETT; concern for developing aspiration pna although no  infiltrates visualized as of yet on CXR - obtain sputum culture and start antibiotics; check lactate and procal - ABG  now  CARDIOVASCULAR 1. Hx HTN and CAD: - allow permissive HTN per Neurology. SBP goal <220 - hold ASA and Subcut heparin in case patient needs crani  2. Hyperlipidemia - continue atorvastatin 40mg  daily.   RENAL 1. Hypokalemia; AKI: - creatinine 1.26, up from baseline of 1.01 (07/2017); foley catheter in place. Monitor UOP and avoid nephrotoxic agents - mild hypokalemia at 3.5; will replete with 40MEQ KCL IV  GASTROINTESTINAL 1. Hx GERD: - NPO; GI prophylaxis   HEMATOLOGIC No active issues; SCD's for DVT prophylaxis  INFECTIOUS 1. Aspiration pna: - no infiltrate visualized on CXR but thick creamy secretions on suction of ETT - obtain sputum culture, procal, lactate; start Zosyn. MRSA nares PCR negative.   ENDOCRINE No active issues; NPO; SSI PRN  NEUROLOGIC 1. Acute Cerebellar CVA: - continue 3% saline for treatment of his cerebral edema - place CVC now - TTE ordered - allow permissive HTN up to SBP 220 - PT/OT/Speech - check UDS - Remainder per Neurology   FAMILY  - Updates: updated patient's brother over the phone  - Inter-disciplinary family meet or Palliative Care meeting due by:  09/10/17   60 minutes nonprocedural critical care time  Vernie Murders, MD  Pulmonary and [place] Pager: [phone]  09/04/2017, 10:14 PM

## 2017-09-04 NOTE — ED Notes (Signed)
Unable to complete the rest of pt's triage at this time due to pt's medical condition at this time.

## 2017-09-04 NOTE — Progress Notes (Signed)
OT Cancellation Note  Patient Details Name: Dillon Garcia MRN: [id number] DOB: Sep 27, 1955   Cancelled Treatment:    Reason Eval/Treat Not Completed: Patient not medically ready. Order received, chart reviewed. Per PT, RN consulted. Pt intubated this morning and very limited in mobility.  Will hold until pt is medically appropriate for OT evaluation.   Jeni Salles, MPH, MS, OTR/L ascom [phone] 09/04/17, 2:06 PM

## 2017-09-04 NOTE — ED Provider Notes (Signed)
[facility] Emergency Department Provider Note   ____________________________________________   First MD Initiated Contact with Patient 09/04/17 [phone]     (approximate)  I have reviewed the triage vital signs and the nursing notes.   HISTORY  Chief Complaint Dillon Garcia possible seizure  Level 5 caveat: Limited by unresponsive state  HPI Dillon Garcia is a 62 y.o. male brought to the ED via EMS from [place] parking lot for unresponsiveness.  [employer] employee noted patient's car out in the parking lot for several hours.  Unknown how long patient had been out there.  Upon EMS arrival, patient with what appeared to be seizure-like activity so EMS administered 2 mg Versed.  Patient arrives to the ED seemingly postictal with snoring respirations.   Past medical history . Anemia  . At risk for falls  . Back pain  . Hypertension  . Hypothyroidism  . Mitral valve disorder    Patient Active Problem List   Diagnosis Date Noted  . 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 Authorizing Provider  amLODipine (NORVASC) 5 MG tablet Take 5 mg by mouth daily as needed (bp > 140/90).    [provider]  aspirin EC 81 MG tablet Take 81 mg by mouth daily.    [provider]  atorvastatin (LIPITOR) 40 MG tablet Take 40 mg by mouth daily.    [provider]  famotidine (PEPCID) 10 MG tablet Take 10 mg by mouth at bedtime as needed for heartburn or indigestion.    [provider]  lisinopril-hydrochlorothiazide (PRINZIDE,ZESTORETIC) 20-12.5 MG tablet Take 1 tablet by mouth 2 (two) times daily.    [provider]  metoprolol succinate (TOPROL-XL) 100 MG 24 hr tablet Take 100 mg by mouth daily. Take with or immediately following a meal.    [provider]    Allergies Patient has no  allergy information on record.  No family history on file.  Social History Social History   Tobacco Use  . Smoking status: Not on file  Substance Use Topics  . Alcohol use: Not on file  . Drug use: Not on file  Former smoker  Review of Systems  Constitutional: No fever/chills. Eyes: No visual changes. ENT: No sore throat. Cardiovascular: Denies chest pain. Respiratory: Denies shortness of breath. Gastrointestinal: No abdominal pain.  No nausea, no vomiting.  No diarrhea.  No constipation. Genitourinary: Negative for dysuria. Musculoskeletal: Negative for back pain. Skin: Negative for rash. Neurological: Positive for unresponsive state and probable seizure.  Negative for headaches, focal weakness or numbness.  10 point review of systems limited by patient's unresponsive state ____________________________________________   PHYSICAL EXAM:  VITAL SIGNS: ED Triage Vitals  Enc Vitals Group     BP      Pulse      Resp      Temp      Temp src      SpO2      Weight      Height      Head Circumference      Peak Flow      Pain Score      Pain Loc      Pain Edu?      Excl. in [place]?     Constitutional: Unresponsive with sonorous respirations.   Eyes: Conjunctivae are normal. PERRL. EOMI. Head: Atraumatic. Nose: No deformity noted. Mouth/Throat:  Mucous membranes are moist.  No dental malocclusion.  Tongue with moderate swelling. Neck: No stridor.  No cervical spine tenderness to palpation.  No step-offs or deformities noted. Cardiovascular: Normal rate, regular rhythm. Grossly normal heart sounds.  Good peripheral circulation. Respiratory: Sonorous respirations.  Scattered rhonchi bilaterally.  Gastrointestinal: Soft and nontender. No distention. No abdominal bruits. No CVA tenderness. Musculoskeletal: No lower extremity tenderness nor edema.  No joint effusions. Neurologic: Minimally responsive to painful stimuli.  Mild right facial droop noted.  Drooling from left side  of mouth.  MAEx4 to painful stimulus. Skin:  Skin is warm, dry and intact. No rash noted. Psychiatric: Unable to assess. ____________________________________________   LABS (all labs ordered are listed, but only abnormal results are displayed)  Labs Reviewed  CBC WITH DIFFERENTIAL/PLATELET - Abnormal; Notable for the following components:      Result Value   WBC 13.7 (*)    Neutro Abs 11.0 (*)    Monocytes Absolute 1.2 (*)    All other components within normal limits  COMPREHENSIVE METABOLIC PANEL - Abnormal; Notable for the following components:   Potassium 3.4 (*)    Glucose, Bld 104 (*)    BUN 24 (*)    Creatinine, Ser 1.28 (*)    Total Bilirubin 1.3 (*)    GFR calc non Af Amer 58 (*)    All other components within normal limits  ACETAMINOPHEN LEVEL - Abnormal; Notable for the following components:   Acetaminophen (Tylenol), Serum <10 (*)    All other components within normal limits  URINALYSIS, COMPLETE (UACMP) WITH MICROSCOPIC - Abnormal; Notable for the following components:   Color, Urine YELLOW (*)    APPearance CLEAR (*)    Hgb urine dipstick MODERATE (*)    Ketones, ur 5 (*)    All other components within normal limits  URINE DRUG SCREEN, QUALITATIVE ([facility] ONLY) - Abnormal; Notable for the following components:   Benzodiazepine, Ur Scrn POSITIVE (*)    All other components within normal limits  ETHANOL  SALICYLATE LEVEL  TROPONIN I  BLOOD GAS, ARTERIAL  LACTIC ACID, PLASMA  PROTIME-INR  APTT  LACTIC ACID, PLASMA   ____________________________________________  EKG  ED ECG REPORT I, Darrius Montano J, the attending physician, personally viewed and interpreted this ECG.   Date: 09/04/2017  EKG Time: 0606  Rate: 89  Rhythm: normal EKG, normal sinus rhythm  Axis: Normal  Intervals:none  ST&T Change: Nonspecific  ____________________________________________  RADIOLOGY  ED MD interpretation: Acute to subacute infarct; ETT needs to advance 2cm  Official  radiology report(s): Ct Head Wo Contrast  Result Date: 09/04/2017 CLINICAL DATA:  Initial evaluation for acute unresponsiveness. EXAM: CT HEAD WITHOUT CONTRAST TECHNIQUE: Contiguous axial images were obtained from the base of the skull through the vertex without intravenous contrast. COMPARISON:  None. FINDINGS: Brain: Generalized age-related cerebral atrophy. Mild chronic small vessel ischemic change present within the hemispheric cerebral white matter. Confluent hypodensity involving the superior left cerebellar hemisphere, consistent with evolving acute ischemic left superior cerebellar artery territory infarct. No associated hemorrhage. No significant mass effect at this time. Adjacent fourth ventricle remains patent. No inferior herniation. No other evidence for acute large vessel territory infarct. No intracranial hemorrhage. No mass lesion or midline shift. No hydrocephalus. No extra-axial fluid collection. Vascular: No hyperdense vessel. Scattered vascular calcifications noted within the carotid siphons. Skull: Scalp soft tissues and calvarium within normal limits. Sinuses/Orbits: Globes and orbital soft tissues within normal limits. Small air-fluid level noted within the right maxillary sinus. Scattered  mucosal thickening within the ethmoidal air cells. Visualized mastoids are clear. Other: None. IMPRESSION: 1. Evolving acute ischemic infarct involving the superior left cerebellar hemisphere, left superior cerebellar artery territory. No associated hemorrhage or significant mass effect at this time. 2. Age-related cerebral atrophy with mild chronic small vessel ischemic disease. Critical Value/emergent results were called by telephone at the time of interpretation on 09/04/2017 at 5:56 am to Dr. Lurline Hare , who verbally acknowledged these results. Electronically Signed   By: Jeannine Boga M.D.   On: 09/04/2017 05:56   Dg Chest Port 1 View  Result Date: 09/04/2017 CLINICAL DATA:  Intubation.  EXAM: PORTABLE CHEST 1 VIEW COMPARISON:  None. FINDINGS: Endotracheal tube tip just above the clavicular heads 7.4 cm from the carina. Advancement of 1-2 cm would lead to optimal placement. Post median sternotomy. Heart size is normal. No pulmonary edema. Patchy left infrahilar opacities partially obscured by overlying monitoring devices. No large pleural effusion or pneumothorax. IMPRESSION: 1. Endotracheal tube tip just above the clavicular heads 7.4 cm from the carina, advancement of 1-2 cm would lead to optimal placement. 2. Patchy left infrahilar opacities likely atelectasis, partially obscured by overlying monitoring device. Electronically Signed   By: Jeb Levering M.D.   On: 09/04/2017 06:47    ____________________________________________   PROCEDURES  Procedure(s) performed:     Procedure Name: Intubation Date/Time: 09/04/2017 5:57 AM Performed by: Paulette Blanch, MD Pre-anesthesia Checklist: Patient identified, Patient being monitored, Emergency Drugs available, Timeout performed and Suction available Oxygen Delivery Method: Ambu bag Preoxygenation: Pre-oxygenation with 100% oxygen Induction Type: Rapid sequence Ventilation: Mask ventilation without difficulty Laryngoscope Size: Glidescope and 3 Grade View: Grade II Tube size: 7.5 mm Number of attempts: 2 Airway Equipment and Method: Rigid stylet Placement Confirmation: ETT inserted through vocal cords under direct vision,  CO2 detector,  Breath sounds checked- equal and bilateral and Positive ETCO2 Difficulty Due To: Difficult Airway- due to large tongue Comments: Attempted intubation with ketamine only initially but patient subsequently required succinylcholine.  Swollen tongue most likely secondary to probable seizure.       Critical Care performed: Yes, see critical care note(s)   CRITICAL CARE Performed by: Paulette Blanch   Total critical care time: 45 minutes  Critical care time was exclusive of separately  billable procedures and treating other patients.  Critical care was necessary to treat or prevent imminent or life-threatening deterioration.  Critical care was time spent personally by me on the following activities: development of treatment plan with patient and/or surrogate as well as nursing, discussions with consultants, evaluation of patient's response to treatment, examination of patient, obtaining history from patient or surrogate, ordering and performing treatments and interventions, ordering and review of laboratory studies, ordering and review of radiographic studies, pulse oximetry and re-evaluation of patient's condition.  ____________________________________________   INITIAL IMPRESSION / ASSESSMENT AND PLAN / ED COURSE  As part of my medical decision making, I reviewed the following data within the [place] notes reviewed and incorporated, Labs reviewed, EKG interpreted, Old chart reviewed, Radiograph reviewed, Discussed with admitting physician and Notes from prior ED visits   62 year old male found unresponsive in his car in the [place] parking lot.  Reported seizure-like activity upon EMS arrival.  Versed given en route to the ED.  Differential diagnosis includes, but is not limited to, alcohol, illicit or prescription medications, or other toxic ingestion; intracranial pathology such as stroke or intracerebral hemorrhage; fever or infectious causes including sepsis; hypoxemia and/or hypercarbia; uremia;  trauma; endocrine related disorders such as diabetes, hypoglycemia, and thyroid-related diseases; hypertensive encephalopathy; etc.   Mild gag reflex to tongue blade.  Will send patient for urgent CT head.  Consider Narcan upon patient's return to the treatment room.  Low threshold for intubation as patient has had unknown downtime and is at great risk for aspiration.  We will also obtain blood for screening toxicological lab work and urine.  Anticipate  hospitalization.   Clinical Course as of Sep 04 700  Mon Sep 04, 2017  0554 Patient intubated upon his return from CT scan for airway protection.  Please see procedure note.  Spoke with radiologist regarding patient's CT head which does not show intracranial hemorrhage but acute to subacute stroke.  Patient is not a candidate for TPA as we do not know time of onset.  Will discuss with hospitalist to evaluate patient in the emergency department for admission.   [JS]  [phone] Charge nurse has tried to contact the emergency contact number in patient's chart.  This number has been discontinued.  [place] police got involved and sent someone to patient's house.  House appears to be vacant.  No family or friends have arrived to visit the patient.   [JS]    Clinical Course User Index [JS] Paulette Blanch, MD     ____________________________________________   FINAL CLINICAL IMPRESSION(S) / ED DIAGNOSES  Final diagnoses:  Cerebrovascular accident (CVA), unspecified mechanism ([place])  Acute respiratory failure, unspecified whether with hypoxia or hypercapnia [facility])     ED Discharge Orders    None       Note:  This document was prepared using Dragon voice recognition software and may include unintentional dictation errors.    Paulette Blanch, MD 09/04/17 [phone]

## 2017-09-04 NOTE — Progress Notes (Signed)
Pt had a neurological change between 1300 assessment and 1500 assessment. Pt was showing a flicker of movement to painful stimuli to the right upper and lower extremities during my 1300 assessment. During my 1500 assessment pt had no movement to painful stimuli to the right upper and lower extremities. Dr. Mortimer Fries notified of same and stated that he was unsure of what further course of action Dr. Doy Mince would want and to page Dr. Doy Mince. Dr. Doy Mince paged at [phone]. Received a call from Dr. Doy Mince at approximately [phone] stating that she was going to place orders for further treatment. Will continue to monitor and follow through with orders once in place.

## 2017-09-04 NOTE — H&P (Signed)
Dillon Garcia is an 62 y.o. male.   Chief Complaint: Unresponsive HPI: The patient with past medical history of hypertension was brought to the emergency department via EMS after being found in his car at [facility].  The patient was unresponsive and appeared to be shaking.  EMS believed him to have some seizure activity.  In the emergency department the patient was found to have obvious facial droop/grimace of the right side of his face.  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  The patient was also very rhonchorous and had a swollen tongue respond to the emergency department staff to intubate, after which they called the hospitalist service for admission.  Past Medical History:  Diagnosis Date  . Heart disease   . HTN (hypertension)     Past Surgical History:  Procedure Laterality Date  . MEDIAN STERNOTOMY     CABG? valve replacement?    No family history on file. Unknown as patient does not have family members in hospital records to call  Social History:  has no tobacco, alcohol, and drug history on file.  Allergies: Allergies not on file  Prior to Admission medications   Medication Sig Start Date End Date Taking? Authorizing Provider  amLODipine (NORVASC) 5 MG tablet Take 5 mg by mouth daily as needed (bp > 140/90).    [provider]  aspirin EC 81 MG tablet Take 81 mg by mouth daily.    [provider]  atorvastatin (LIPITOR) 40 MG tablet Take 40 mg by mouth daily.    [provider]  famotidine (PEPCID) 10 MG tablet Take 10 mg by mouth at bedtime as needed for heartburn or indigestion.    [provider]  lisinopril-hydrochlorothiazide (PRINZIDE,ZESTORETIC) 20-12.5 MG tablet Take 1 tablet by mouth 2 (two) times daily.    [provider]  metoprolol succinate (TOPROL-XL) 100 MG 24 hr tablet Take 100 mg by mouth daily. Take with or immediately following a meal.    [provider]     Results for orders  placed or performed during the hospital encounter of 09/04/17 (from the past 48 hour(s))  CBC with Differential     Status: Abnormal   Collection Time: 09/04/17  5:24 AM  Result Value Ref Range   WBC 13.7 (H) 3.8 - 10.6 K/uL   RBC 5.62 4.40 - 5.90 MIL/uL   Hemoglobin 16.9 13.0 - 18.0 g/dL   HCT 50.2 40.0 - 52.0 %   MCV 89.2 80.0 - 100.0 fL   MCH 30.0 26.0 - 34.0 pg   MCHC 33.6 32.0 - 36.0 g/dL   RDW 14.5 11.5 - 14.5 %   Platelets 162 150 - 440 K/uL   Neutrophils Relative % 79 %   Neutro Abs 11.0 (H) 1.4 - 6.5 K/uL   Lymphocytes Relative 11 %   Lymphs Abs 1.5 1.0 - 3.6 K/uL   Monocytes Relative 9 %   Monocytes Absolute 1.2 (H) 0.2 - 1.0 K/uL   Eosinophils Relative 0 %   Eosinophils Absolute 0.0 0 - 0.7 K/uL   Basophils Relative 1 %   Basophils Absolute 0.1 0 - 0.1 K/uL    Comment: Performed at [facility], [address] [place], [place] [address]  Comprehensive metabolic panel     Status: Abnormal   Collection Time: 09/04/17  5:24 AM  Result Value Ref Range   Sodium 143 135 - 145 mmol/L   Potassium 3.4 (L) 3.5 - 5.1 mmol/L   Chloride  105 101 - 111 mmol/L   CO2 24 22 - 32 mmol/L   Glucose, Bld 104 (H) 65 - 99 mg/dL   BUN 24 (H) 6 - 20 mg/dL   Creatinine, Ser 1.28 (H) 0.61 - 1.24 mg/dL   Calcium 9.4 8.9 - 10.3 mg/dL   Total Protein 6.9 6.5 - 8.1 g/dL   Albumin 4.1 3.5 - 5.0 g/dL   AST 36 15 - 41 U/L   ALT 24 17 - 63 U/L   Alkaline Phosphatase 60 38 - 126 U/L   Total Bilirubin 1.3 (H) 0.3 - 1.2 mg/dL   GFR calc non Af Amer 58 (L) >60 mL/min   GFR calc Af Amer >60 >60 mL/min    Comment: (NOTE) The eGFR has been calculated using the CKD EPI equation. This calculation has not been validated in all clinical situations. eGFR's persistently <60 mL/min signify possible Chronic Kidney Disease.    Anion gap 14 5 - 15    Comment: Performed at [facility], [address] [place], [place] [address]  Ethanol     Status: None   Collection Time: 09/04/17   5:24 AM  Result Value Ref Range   Alcohol, Ethyl (B) <10 <10 mg/dL    Comment: (NOTE) Lowest detectable limit for serum alcohol is 10 mg/dL. For medical purposes only. Performed at [facility], [place], [place] [address]   Acetaminophen level     Status: Abnormal   Collection Time: 09/04/17  5:24 AM  Result Value Ref Range   Acetaminophen (Tylenol), Serum <10 (L) 10 - 30 ug/mL    Comment: (NOTE) Therapeutic concentrations vary significantly. A range of 10-30 ug/mL  may be an effective concentration for many patients. However, some  are best treated at concentrations outside of this range. Acetaminophen concentrations >150 ug/mL at 4 hours after ingestion  and >50 ug/mL at 12 hours after ingestion are often associated with  toxic reactions. Performed at [facility], [address] [place], [place] [address]   Salicylate level     Status: None   Collection Time: 09/04/17  5:24 AM  Result Value Ref Range   Salicylate Lvl <5.9 2.8 - 30.0 mg/dL    Comment: Performed at [facility], [address] [place], [place] [address]  Troponin I     Status: None   Collection Time: 09/04/17  5:24 AM  Result Value Ref Range   Troponin I <0.03 <0.03 ng/mL    Comment: Performed at [facility], [address] [place], [place] [address]  Blood gas, arterial     Status: None   Collection Time: 09/04/17  5:29 AM  Result Value Ref Range   FIO2 0.40    Delivery systems VENTILATOR    Mode ASSIST CONTROL    VT 450 mL   Peep/cpap 5.0 cm H20   pH, Arterial 7.37 7.350 - 7.450   pCO2 arterial 44 32.0 - 48.0 mmHg   pO2, Arterial 106 83.0 - 108.0 mmHg   Bicarbonate 25.4 20.0 - 28.0 mmol/L   Acid-base deficit 0.2 0.0 - 2.0 mmol/L   O2 Saturation 97.9 %   Patient temperature 37.0    Collection site LEFT BRACHIAL    Sample type ARTERIAL DRAW    Allens test (pass/fail) PASS PASS   Mechanical Rate 20     Comment: Performed at [facility], [address], [place],  [address]  Urinalysis, Complete w Microscopic     Status:  Abnormal   Collection Time: 09/04/17  6:02 AM  Result Value Ref Range   Color, Urine YELLOW (A) YELLOW   APPearance CLEAR (A) CLEAR   Specific Gravity, Urine 1.015 1.005 - 1.030   pH 6.0 5.0 - 8.0   Glucose, UA NEGATIVE NEGATIVE mg/dL   Hgb urine dipstick MODERATE (A) NEGATIVE   Bilirubin Urine NEGATIVE NEGATIVE   Ketones, ur 5 (A) NEGATIVE mg/dL   Protein, ur NEGATIVE NEGATIVE mg/dL   Nitrite NEGATIVE NEGATIVE   Leukocytes, UA NEGATIVE NEGATIVE   RBC / HPF 11-20 0 - 5 RBC/hpf   WBC, UA 0-5 0 - 5 WBC/hpf   Bacteria, UA NONE SEEN NONE SEEN   Squamous Epithelial / LPF NONE SEEN 0 - 5   Mucus PRESENT    Hyaline Casts, UA PRESENT     Comment: Performed at [facility], [address] [place], [place] [address]  Urine Drug Screen, Qualitative     Status: Abnormal   Collection Time: 09/04/17  6:02 AM  Result Value Ref Range   Tricyclic, Ur Screen NONE DETECTED NONE DETECTED   Amphetamines, Ur Screen NONE DETECTED NONE DETECTED   MDMA (Ecstasy)Ur Screen NONE DETECTED NONE DETECTED   Cocaine Metabolite,Ur [place] NONE DETECTED NONE DETECTED   Opiate, Ur Screen NONE DETECTED NONE DETECTED   Phencyclidine (PCP) Ur S NONE DETECTED NONE DETECTED   Cannabinoid 50 Ng, Ur [place] NONE DETECTED NONE DETECTED   Barbiturates, Ur Screen NONE DETECTED NONE DETECTED   Benzodiazepine, Ur Scrn POSITIVE (A) NONE DETECTED   Methadone Scn, Ur NONE DETECTED NONE DETECTED    Comment: (NOTE) Tricyclics + metabolites, urine    Cutoff 1000 ng/mL Amphetamines + metabolites, urine  Cutoff 1000 ng/mL MDMA (Ecstasy), urine              Cutoff 500 ng/mL Cocaine Metabolite, urine          Cutoff 300 ng/mL Opiate + metabolites, urine        Cutoff 300 ng/mL Phencyclidine (PCP), urine         Cutoff 25 ng/mL Cannabinoid, urine                 Cutoff 50 ng/mL Barbiturates + metabolites, urine  Cutoff 200  ng/mL Benzodiazepine, urine              Cutoff 200 ng/mL Methadone, urine                   Cutoff 300 ng/mL The urine drug screen provides only a preliminary, unconfirmed analytical test result and should not be used for non-medical purposes. Clinical consideration and professional judgment should be applied to any positive drug screen result due to possible interfering substances. A more specific alternate chemical method must be used in order to obtain a confirmed analytical result. Gas chromatography / mass spectrometry (GC/MS) is the preferred confirmat ory method. Performed at [facility], [address] [place], [place] [address]   Lactic acid, plasma     Status: None   Collection Time: 09/04/17  6:02 AM  Result Value Ref Range   Lactic Acid, Venous 1.5 0.5 - 1.9 mmol/L    Comment: Performed at [facility], [address] [place], [place] [address]   Ct Head Wo Contrast  Result Date: 09/04/2017 CLINICAL DATA:  Initial evaluation for acute unresponsiveness. EXAM: CT HEAD WITHOUT CONTRAST TECHNIQUE: Contiguous axial images were obtained from the base of the skull through the vertex without intravenous contrast.  COMPARISON:  None. FINDINGS: Brain: Generalized age-related cerebral atrophy. Mild chronic small vessel ischemic change present within the hemispheric cerebral white matter. Confluent hypodensity involving the superior left cerebellar hemisphere, consistent with evolving acute ischemic left superior cerebellar artery territory infarct. No associated hemorrhage. No significant mass effect at this time. Adjacent fourth ventricle remains patent. No inferior herniation. No other evidence for acute large vessel territory infarct. No intracranial hemorrhage. No mass lesion or midline shift. No hydrocephalus. No extra-axial fluid collection. Vascular: No hyperdense vessel. Scattered vascular calcifications noted within the carotid siphons. Skull: Scalp soft  tissues and calvarium within normal limits. Sinuses/Orbits: Globes and orbital soft tissues within normal limits. Small air-fluid level noted within the right maxillary sinus. Scattered mucosal thickening within the ethmoidal air cells. Visualized mastoids are clear. Other: None. IMPRESSION: 1. Evolving acute ischemic infarct involving the superior left cerebellar hemisphere, left superior cerebellar artery territory. No associated hemorrhage or significant mass effect at this time. 2. Age-related cerebral atrophy with mild chronic small vessel ischemic disease. Critical Value/emergent results were called by telephone at the time of interpretation on 09/04/2017 at 5:56 am to Dr. Lurline Hare , who verbally acknowledged these results. Electronically Signed   By: Jeannine Boga M.D.   On: 09/04/2017 05:56   Dg Chest Port 1 View  Result Date: 09/04/2017 CLINICAL DATA:  Intubation. EXAM: PORTABLE CHEST 1 VIEW COMPARISON:  None. FINDINGS: Endotracheal tube tip just above the clavicular heads 7.4 cm from the carina. Advancement of 1-2 cm would lead to optimal placement. Post median sternotomy. Heart size is normal. No pulmonary edema. Patchy left infrahilar opacities partially obscured by overlying monitoring devices. No large pleural effusion or pneumothorax. IMPRESSION: 1. Endotracheal tube tip just above the clavicular heads 7.4 cm from the carina, advancement of 1-2 cm would lead to optimal placement. 2. Patchy left infrahilar opacities likely atelectasis, partially obscured by overlying monitoring device. Electronically Signed   By: Jeb Levering M.D.   On: 09/04/2017 06:47    Review of Systems  Unable to perform ROS: Critical illness    Blood pressure 126/87, pulse 84, temperature 97.7 F (36.5 C), resp. rate 18, weight 68 kg (150 lb), SpO2 100 %. Physical Exam  Vitals reviewed. Constitutional: He appears well-developed and well-nourished. No distress. He is intubated.  HENT:  Head:  Normocephalic and atraumatic.  Mouth/Throat: Oropharynx is clear and moist.  Eyes: Pupils are equal, round, and reactive to light. Conjunctivae and EOM are normal. No scleral icterus.  Neck: Normal range of motion. Neck supple. No JVD present. No tracheal deviation present. No thyromegaly present.  Cardiovascular: Normal rate, regular rhythm and normal heart sounds. Exam reveals no friction rub.  No murmur heard. Respiratory: He is intubated. He has rhonchi.  GI: Soft. Bowel sounds are normal. He exhibits no distension. There is no tenderness.  Genitourinary:  Genitourinary Comments: Deferred  Musculoskeletal: Normal range of motion. He exhibits no edema.  Lymphadenopathy:    He has no cervical adenopathy.  Neurological: A cranial nerve deficit is present.  Intubated and sedated  Skin: Skin is warm and dry. No erythema.  Psychiatric: He has a normal mood and affect. His behavior is normal. Judgment and thought content normal.     Assessment/Plan This is a 62 year old male admitted for stroke. 1.  Stroke: Left superior cerebellar artery territory.  I have held prophylactic anticoagulation for now due to concern for possible hemorrhagic transformation.  Defer neurology.  Obtain carotid ultrasounds as well as MRI/MRA of the brain and  echocardiogram to rule out embolic etiology.  Patient is also received a loading dose of Keppra initially due to concern for seizure activity 2.  Acute respiratory failure: With hypoxia; continue ventilatory support until the patient can protect his airway.  He may have aspirated.  Monitor for signs or symptoms of sepsis. 3.  Hypertension: Hold antihypertensive medications; permissive hypertension for now 4.  Hyperlipidemia: Resume statin therapy when the patient has a coordinated swallow 5.  DVT prophylaxis: SCDs 6.  GI prophylaxis: None The patient is a full code.  Time spent on admission orders and critical care approximately 45 minutes.  Discussed with [facility]  telemedicine  Harrie Foreman, MD 09/04/2017, 6:54 AM

## 2017-09-04 NOTE — Consult Note (Signed)
Reason for Consult:resp failure Referring Physician: TRAJAN GROVE is an 62 y.o. male.  HPI: admitted to ICU for severe resp failure CT + for acute ischemic CVA  The patient with past medical history of hypertension was brought to the emergency department via EMS after being found in his car at [facility].  The patient was unresponsive and appeared to be shaking.  EMS believed him to have some seizure activity.  In the emergency department the patient was found to have obvious facial droop/grimace of the right side of his face.  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  The patient was also very rhonchorous and had a swollen tongue respond to the emergency department staff to intubate  Patient critically ill Prognosis is poor MRI pending    Past Medical History:  Diagnosis Date  . Heart disease   . HTN (hypertension)     Past Surgical History:  Procedure Laterality Date  . MEDIAN STERNOTOMY     CABG? valve replacement?    No family history on file.  Social History:  has no tobacco, alcohol, and drug history on file.  Allergies: Allergies not on file   Results for orders placed or performed during the hospital encounter of 09/04/17 (from the past 48 hour(s))  CBC with Differential     Status: Abnormal   Collection Time: 09/04/17  5:24 AM  Result Value Ref Range   WBC 13.7 (H) 3.8 - 10.6 K/uL   RBC 5.62 4.40 - 5.90 MIL/uL   Hemoglobin 16.9 13.0 - 18.0 g/dL   HCT 50.2 40.0 - 52.0 %   MCV 89.2 80.0 - 100.0 fL   MCH 30.0 26.0 - 34.0 pg   MCHC 33.6 32.0 - 36.0 g/dL   RDW 14.5 11.5 - 14.5 %   Platelets 162 150 - 440 K/uL   Neutrophils Relative % 79 %   Neutro Abs 11.0 (H) 1.4 - 6.5 K/uL   Lymphocytes Relative 11 %   Lymphs Abs 1.5 1.0 - 3.6 K/uL   Monocytes Relative 9 %   Monocytes Absolute 1.2 (H) 0.2 - 1.0 K/uL   Eosinophils Relative 0 %   Eosinophils Absolute 0.0 0 - 0.7 K/uL   Basophils Relative 1 %   Basophils Absolute 0.1 0 - 0.1  K/uL    Comment: Performed at [facility], [address] [place], [place] [address]  Comprehensive metabolic panel     Status: Abnormal   Collection Time: 09/04/17  5:24 AM  Result Value Ref Range   Sodium 143 135 - 145 mmol/L   Potassium 3.4 (L) 3.5 - 5.1 mmol/L   Chloride 105 101 - 111 mmol/L   CO2 24 22 - 32 mmol/L   Glucose, Bld 104 (H) 65 - 99 mg/dL   BUN 24 (H) 6 - 20 mg/dL   Creatinine, Ser 1.28 (H) 0.61 - 1.24 mg/dL   Calcium 9.4 8.9 - 10.3 mg/dL   Total Protein 6.9 6.5 - 8.1 g/dL   Albumin 4.1 3.5 - 5.0 g/dL   AST 36 15 - 41 U/L   ALT 24 17 - 63 U/L   Alkaline Phosphatase 60 38 - 126 U/L   Total Bilirubin 1.3 (H) 0.3 - 1.2 mg/dL   GFR calc non Af Amer 58 (L) >60 mL/min   GFR calc Af Amer >60 >60 mL/min    Comment: (NOTE) The eGFR has been calculated using the CKD EPI equation. This calculation has not been validated in  all clinical situations. eGFR's persistently <60 mL/min signify possible Chronic Kidney Disease.    Anion gap 14 5 - 15    Comment: Performed at [facility], [address] [place], [place] [address]  Ethanol     Status: None   Collection Time: 09/04/17  5:24 AM  Result Value Ref Range   Alcohol, Ethyl (B) <10 <10 mg/dL    Comment: (NOTE) Lowest detectable limit for serum alcohol is 10 mg/dL. For medical purposes only. Performed at [facility], [place], [place] [address]   Acetaminophen level     Status: Abnormal   Collection Time: 09/04/17  5:24 AM  Result Value Ref Range   Acetaminophen (Tylenol), Serum <10 (L) 10 - 30 ug/mL    Comment: (NOTE) Therapeutic concentrations vary significantly. A range of 10-30 ug/mL  may be an effective concentration for many patients. However, some  are best treated at concentrations outside of this range. Acetaminophen concentrations >150 ug/mL at 4 hours after ingestion  and >50 ug/mL at 12 hours after ingestion are often associated with  toxic  reactions. Performed at [facility], [address] [place], [place] [address]   Salicylate level     Status: None   Collection Time: 09/04/17  5:24 AM  Result Value Ref Range   Salicylate Lvl <9.4 2.8 - 30.0 mg/dL    Comment: Performed at [facility], [address] [place], [place] [address]  Troponin I     Status: None   Collection Time: 09/04/17  5:24 AM  Result Value Ref Range   Troponin I <0.03 <0.03 ng/mL    Comment: Performed at [facility], [address] [place], [place] [address]  Protime-INR     Status: None   Collection Time: 09/04/17  5:24 AM  Result Value Ref Range   Prothrombin Time 12.5 11.4 - 15.2 seconds   INR 0.94     Comment: Performed at [facility], [address] [place], [place] [address]  APTT     Status: None   Collection Time: 09/04/17  5:24 AM  Result Value Ref Range   aPTT 24 24 - 36 seconds    Comment: Performed at [facility], [address] [place], [place] [address]  Blood gas, arterial     Status: None   Collection Time: 09/04/17  5:29 AM  Result Value Ref Range   FIO2 0.40    Delivery systems VENTILATOR    Mode ASSIST CONTROL    VT 450 mL   Peep/cpap 5.0 cm H20   pH, Arterial 7.37 7.350 - 7.450   pCO2 arterial 44 32.0 - 48.0 mmHg   pO2, Arterial 106 83.0 - 108.0 mmHg   Bicarbonate 25.4 20.0 - 28.0 mmol/L   Acid-base deficit 0.2 0.0 - 2.0 mmol/L   O2 Saturation 97.9 %   Patient temperature 37.0    Collection site LEFT BRACHIAL    Sample type ARTERIAL DRAW    Allens test (pass/fail) PASS PASS   Mechanical Rate 20     Comment: Performed at [facility], [address] [place],  [address]  Urinalysis, Complete w Microscopic     Status: Abnormal   Collection Time: 09/04/17  6:02 AM  Result Value Ref Range   Color, Urine YELLOW (A) YELLOW   APPearance CLEAR (A) CLEAR   Specific Gravity, Urine 1.015 1.005 - 1.030   pH 6.0 5.0 - 8.0   Glucose, UA NEGATIVE  NEGATIVE mg/dL   Hgb urine dipstick MODERATE (A) NEGATIVE   Bilirubin Urine NEGATIVE NEGATIVE   Ketones, ur 5 (A) NEGATIVE mg/dL   Protein, ur NEGATIVE NEGATIVE mg/dL   Nitrite NEGATIVE NEGATIVE   Leukocytes, UA NEGATIVE NEGATIVE   RBC / HPF 11-20 0 - 5 RBC/hpf   WBC, UA 0-5 0 - 5 WBC/hpf   Bacteria, UA NONE SEEN NONE SEEN   Squamous Epithelial / LPF NONE SEEN 0 - 5   Mucus PRESENT    Hyaline Casts, UA PRESENT     Comment: Performed at [facility], [address], [place], [place] [address]  Urine Drug Screen, Qualitative     Status: Abnormal   Collection Time: 09/04/17  6:02 AM  Result Value Ref Range   Tricyclic, Ur Screen NONE DETECTED NONE DETECTED   Amphetamines, Ur Screen NONE DETECTED NONE DETECTED   MDMA (Ecstasy)Ur Screen NONE DETECTED NONE DETECTED   Cocaine Metabolite,Ur [place] NONE DETECTED NONE DETECTED   Opiate, Ur Screen NONE DETECTED NONE DETECTED   Phencyclidine (PCP) Ur S NONE DETECTED NONE DETECTED   Cannabinoid 50 Ng, Ur [place] NONE DETECTED NONE DETECTED   Barbiturates, Ur Screen NONE DETECTED NONE DETECTED   Benzodiazepine, Ur Scrn POSITIVE (A) NONE DETECTED   Methadone Scn, Ur NONE DETECTED NONE DETECTED    Comment: (NOTE) Tricyclics + metabolites, urine    Cutoff 1000 ng/mL Amphetamines + metabolites, urine  Cutoff 1000 ng/mL MDMA (Ecstasy), urine              Cutoff 500 ng/mL Cocaine Metabolite, urine          Cutoff 300 ng/mL Opiate + metabolites, urine        Cutoff 300 ng/mL Phencyclidine (PCP), urine         Cutoff 25 ng/mL Cannabinoid, urine                 Cutoff 50 ng/mL Barbiturates + metabolites, urine  Cutoff 200 ng/mL Benzodiazepine, urine              Cutoff 200 ng/mL Methadone, urine                   Cutoff 300 ng/mL The urine drug screen provides only a preliminary, unconfirmed analytical test result and should not be used for non-medical purposes. Clinical consideration and professional judgment should be applied to any positive  drug screen result due to possible interfering substances. A more specific alternate chemical method must be used in order to obtain a confirmed analytical result. Gas chromatography / mass spectrometry (GC/MS) is the preferred confirmat ory method. Performed at [facility], [address] [place], [place] [address]   Lactic acid, plasma     Status: None   Collection Time: 09/04/17  6:02 AM  Result Value Ref Range   Lactic Acid, Venous 1.5 0.5 - 1.9 mmol/L    Comment: Performed at [facility], [address], [place] [address]  Glucose, capillary     Status: Abnormal   Collection Time: 09/04/17  8:18 AM  Result Value Ref Range   Glucose-Capillary 108 (H) 65 - 99 mg/dL    Ct Head Wo Contrast  Result Date: 09/04/2017 CLINICAL DATA:  Initial evaluation for acute unresponsiveness. EXAM: CT HEAD WITHOUT CONTRAST TECHNIQUE: Contiguous axial images were obtained from the base of the skull through the vertex without intravenous contrast. COMPARISON:  None. FINDINGS: Brain: Generalized age-related cerebral atrophy. Mild chronic small vessel ischemic change present  within the hemispheric cerebral white matter. Confluent hypodensity involving the superior left cerebellar hemisphere, consistent with evolving acute ischemic left superior cerebellar artery territory infarct. No associated hemorrhage. No significant mass effect at this time. Adjacent fourth ventricle remains patent. No inferior herniation. No other evidence for acute large vessel territory infarct. No intracranial hemorrhage. No mass lesion or midline shift. No hydrocephalus. No extra-axial fluid collection. Vascular: No hyperdense vessel. Scattered vascular calcifications noted within the carotid siphons. Skull: Scalp soft tissues and calvarium within normal limits. Sinuses/Orbits: Globes and orbital soft tissues within normal limits. Small air-fluid level noted within the right maxillary sinus. Scattered  mucosal thickening within the ethmoidal air cells. Visualized mastoids are clear. Other: None. IMPRESSION: 1. Evolving acute ischemic infarct involving the superior left cerebellar hemisphere, left superior cerebellar artery territory. No associated hemorrhage or significant mass effect at this time. 2. Age-related cerebral atrophy with mild chronic small vessel ischemic disease. Critical Value/emergent results were called by telephone at the time of interpretation on 09/04/2017 at 5:56 am to Dr. Lurline Hare , who verbally acknowledged these results. Electronically Signed   By: Jeannine Boga M.D.   On: 09/04/2017 05:56   Dg Chest Port 1 View  Result Date: 09/04/2017 CLINICAL DATA:  Intubation. EXAM: PORTABLE CHEST 1 VIEW COMPARISON:  None. FINDINGS: Endotracheal tube tip just above the clavicular heads 7.4 cm from the carina. Advancement of 1-2 cm would lead to optimal placement. Post median sternotomy. Heart size is normal. No pulmonary edema. Patchy left infrahilar opacities partially obscured by overlying monitoring devices. No large pleural effusion or pneumothorax. IMPRESSION: 1. Endotracheal tube tip just above the clavicular heads 7.4 cm from the carina, advancement of 1-2 cm would lead to optimal placement. 2. Patchy left infrahilar opacities likely atelectasis, partially obscured by overlying monitoring device. Electronically Signed   By: Jeb Levering M.D.   On: 09/04/2017 06:47    Review of Systems  Unable to perform ROS: Acuity of condition   Blood pressure (!) 145/100, pulse 69, temperature (!) 97 F (36.1 C), resp. rate (!) 21, weight 150 lb (68 kg), SpO2 100 %. Physical Exam  Constitutional: He appears distressed.  resp failure, orally intubated  HENT:  Head: Normocephalic.  Neck: Neck supple.  Cardiovascular: Normal rate and regular rhythm.  Respiratory: Effort normal and breath sounds normal.  GI: Soft. He exhibits no distension.  Neurological:  GCS>8T sedated  Skin:  Skin is warm and dry.    Assessment/Plan: 62 yo AAM with acute CVA with severe encephalopathy and severe resp failure with inability to protect airway  1.Respiratory Failure -continue Full MV support -continue Bronchodilator Therapy -Wean Fio2 and PEEP as tolerated -will perform SAT/SBt when respiratory parameters are met   2.- intubated and sedated - minimal sedation to achieve a RASS goal: -1 Neuro consulted MRI pending Obtain EEG  3.Vital signs per ICU Avoid secondary brain injury     Critical Care Time devoted to patient care services described in this note is 45 minutes.   Overall, patient is critically ill, prognosis is guarded.  Patient with Multiorgan failure and at high risk for cardiac arrest and death.    Corrin Parker, M.D.  [facility]  Medical Director [place] Director [facility] Cardio-Pulmonary Department

## 2017-09-04 NOTE — Progress Notes (Signed)
Pt transferred via [place] at 2005.

## 2017-09-04 NOTE — Progress Notes (Signed)
Pharmacy Antibiotic Note  Dillon Garcia is a 62 y.o. male admitted on 09/04/2017 with pneumonia.  Pharmacy has been consulted for zosyn dosing.  Plan: Zosyn 3.375g IV q8h (4 hour infusion).  F/u renal function, cultures and clinical course  Height: 5\' 5"  (165.1 cm) Weight: 125 lb 10.6 oz (57 kg) IBW/kg (Calculated) : 61.5  Temp (24hrs), Avg:97.7 F (36.5 C), Min:96.8 F (36 C), Max:98.3 F (36.8 C)  Recent Labs  Lab 09/04/17 0524 09/04/17 0602 09/04/17 0935 09/04/17 2118  WBC 13.7*  --   --  14.6*  CREATININE 1.28*  --   --  1.26*  LATICACIDVEN  --  1.5 1.5  --     Estimated Creatinine Clearance: 49 mL/min (A) (by C-G formula based on SCr of 1.26 mg/dL (H)).    No Known Allergies   Thank you for allowing pharmacy to be a part of this patient's care.  Excell Seltzer Poteet 09/04/2017 11:03 PM

## 2017-09-04 NOTE — ED Triage Notes (Signed)
Pt arrives via [facility] with c/o unresponsive with possible seizure. Pt was found by [employer] employee in his car and EMS was called. Per EMS, pt has been unresponsive the entirety of the time with them. Per EMS VS WDL. Pt is unresponsive at this time in triage.

## 2017-09-04 NOTE — H&P (Signed)
STROKE H&P  CC: Stroke transfer from [facility]  History is obtained from: chart  HPI: Dillon Garcia is a 62 y.o. male, was brought in intubated from [facility] hospital as a transfer for higher level of care for a cerebellar stroke. History is obtained from chart review per Dr. Omer Jack note from earlier this morning.  Reportedly the patient was brought to [facility] emergency department via EMS after being found in his car at [facility]  He was unresponsive and appeared to be shaking at that point.  EMS believed that he might have had some seizure activity.  In the emergency department he was found to have obvious facial droop on the right side of his face, CT revealed an acute left cerebellar infarct.  He was admitted for further evaluation.  Initial NIH on arrival by Dr. Doy Mince was 71.  There is no last known normal.  He was not given TPA because of no reliable last known normal.  No family member available at this time to provide any history.  He remains intubated.  According to Dr. Doy Mince, who I spoke with over the phone, his mental status started to decline while the transfer was being arranged.  He was following simple commands but that had started to change.  He was started on hypertonic saline.  Upon my assessment of the patient in the neuro ICU, he did not have hypertonic saline running on the way and it was started upon arrival in the ICU.  LKW: unable to determine tpa given?: no, due to no last known well time. Premorbid modified Rankin scale (mRS): 0 (presuming he was driving and was found in his car)  ROS:  Unable to obtain due to altered mental status.   Past Medical History:  Diagnosis Date  . Heart disease   . HTN (hypertension)   No family history on file.  Patient unable to provide due to being intubated  Social History:   has no tobacco, alcohol, and drug history on file. Able to obtain as patient is intubated  Medications  Current  Facility-Administered Medications:  .   stroke: mapping our early stages of recovery book, , Does not apply, Once, Amie Portland, MD .  0.9 %  sodium chloride infusion, , Intravenous, Continuous, Amie Portland, MD .  acetaminophen (TYLENOL) tablet 650 mg, 650 mg, Oral, Q4H PRN **OR** acetaminophen (TYLENOL) solution 650 mg, 650 mg, Per Tube, Q4H PRN **OR** acetaminophen (TYLENOL) suppository 650 mg, 650 mg, Rectal, Q4H PRN, Amie Portland, MD .  senna-docusate (Senokot-S) tablet 1 tablet, 1 tablet, Oral, QHS PRN, Amie Portland, MD  Exam: Current vital signs: There were no vitals taken for this visit. Vital signs in last 24 hours: Temp:  [96.8 F (36 C)-98.3 F (36.8 C)] 98.2 F (36.8 C) (05/20 1600) Pulse Rate:  [69-92] 84 (05/20 1900) Resp:  [12-26] 12 (05/20 1900) BP: (102-164)/(70-107) 141/93 (05/20 1900) SpO2:  [93 %-100 %] 97 % (05/20 1900) FiO2 (%):  [30 %-40 %] 30 % (05/20 1603) Weight:  [57.7 kg (127 lb 3.3 oz)-68 kg (150 lb)] 57.7 kg (127 lb 3.3 oz) (05/20 7846)  GENERAL: Drowsy, easy to arouse, alert in NAD HEENT: - Normocephalic and atraumatic, dry mm, no LN++, no Thyromegally LUNGS - Clear to auscultation bilaterally with no wheezes CV - S1S2 RRR, no m/r/g, equal pulses bilaterally. ABDOMEN - Soft, nontender, nondistended with normoactive BS Ext: warm, well perfused, intact peripheral pulses, no edema  NEURO:  Mental Status: Patient is intubated, opens  eyes to voice, follows simple commands. Language: Unable to assess speech as he is intubated Cranial Nerves: PERRL, his eyes are disconjugate with right eye exotropic, he is not able to move his eyes side to side but is able to look up and down.  Facial asymmetry difficult to ascertain because of the endotracheal tube  Motor: 0/5 right upper extremity, 1/5 right lower extremity, 5/5 left upper and left lower extremity. Tone: Tone is increased in the right upper and right lower extremity Sensation-decreased sensation on the  right side of the body Coordination: Unable to perform on the right, intact on the left. Gait- deferred DTRs hyperreflexic on the right NIHSS 1a Level of Conscious.: 1 1b LOC Questions: 2 1c LOC Commands: 0 2 Best Gaze: 2 3 Visual: 0 4 Facial Palsy: 0 5a Motor Arm - left: 0 5b Motor Arm - Right: 4 6a Motor Leg - Left: 0 6b Motor Leg - Right: 3 7 Limb Ataxia: 0 8 Sensory: 2 9 Best Language: 3 10 Dysarthria: 2 11 Extinct. and Inatten.: 0 TOTAL: 19  Labs I have reviewed labs in epic and the results pertinent to this consultation are:  CBC    Component Value Date/Time   WBC 13.7 (H) 09/04/2017 0524   RBC 5.62 09/04/2017 0524   HGB 16.9 09/04/2017 0524   HCT 50.2 09/04/2017 0524   PLT 162 09/04/2017 0524   MCV 89.2 09/04/2017 0524   MCH 30.0 09/04/2017 0524   MCHC 33.6 09/04/2017 0524   RDW 14.5 09/04/2017 0524   LYMPHSABS 1.5 09/04/2017 0524   MONOABS 1.2 (H) 09/04/2017 0524   EOSABS 0.0 09/04/2017 0524   BASOSABS 0.1 09/04/2017 0524    CMP     Component Value Date/Time   NA 142 09/04/2017 1834   K 3.4 (L) 09/04/2017 0524   CL 105 09/04/2017 0524   CO2 24 09/04/2017 0524   GLUCOSE 104 (H) 09/04/2017 0524   BUN 24 (H) 09/04/2017 0524   CREATININE 1.28 (H) 09/04/2017 0524   CALCIUM 9.4 09/04/2017 0524   PROT 6.9 09/04/2017 0524   ALBUMIN 4.1 09/04/2017 0524   AST 36 09/04/2017 0524   ALT 24 09/04/2017 0524   ALKPHOS 60 09/04/2017 0524   BILITOT 1.3 (H) 09/04/2017 0524   GFRNONAA 58 (L) 09/04/2017 0524   GFRAA >60 09/04/2017 0524   Imaging I have reviewed the images obtained: CT-scan of the brain-acute ischemic infarct of the left cerebellar hemisphere, no hemorrhage. MRI of the brain showed left greater than right pontocerebell ischemic infarctar with regional mass-effect without obstructive hydrocephalus. MRA of the head showed slow flow versus occluded left vertebral artery.  No flow-limiting stenosis on other vessels.  Assessment:  62 year old man  brought in with a large left and a small right pontocerebellar stroke. He was intubated  at [facility] and transferred to [facility] for higher level of care. His last imaging did not reveal any evidence of hydrocephalus. We will repeat imaging at midnight and assess for any need for suboccipital crani.  Impression: Acute ischemic stroke of the left and right cerebellum and pons- likely due to atheroembolic/cardio embolic occlusion of the posterior circulation  Plan: -Admit to neurological ICU NIH stroke scale 19 -Blood pressure parameters-allow for permissive hypertension.  Treat only if systolic blood pressure goes over 220 on a as needed basis. -critical care consult for vent management -Close neuro monitoring  CNS Cerebral edema Compression of brain -Hyperosmolar therapy -3% saline at 75 cc.  1 dose of  mannitol through peripheral IV. -We will request critical care services for central line for higher rate of hyperosmolar therapy. -Close neuro monitoring  Dysarthria Dysphagia following ICH  -NPO until cleared by speech -Continue PT/OT/ST  RESP Acute Respiratory Failure  -vent management per ICU -wean when able  CV Essential (primary) hypertension -Allow for permissive hypertension as above -TTE -Hold antiplatelets as he might need crani  GI/GU -Gentle hydration  HEME -Monitor -transfuse for hgb < 7  ENDO -goal HgbA1c < 7  Fluid/Electrolyte Disorders -labs and replete as needed  ID Possible Aspiration PNA -CXR   Prophylaxis DVT: scd  GI: per PCCM vent bundle-protonix Bowel: doc senna  Dispo: IP Rehab  Diet: NPO   Code Status: Full Code  -- Amie Portland, MD [facility] Pager: [phone] If 7pm to 7am, please call on call as listed on AMION.   CRITICAL CARE ATTESTATION This patient is critically ill and at significant risk of neurological worsening, death and care requires constant monitoring of vital signs,  hemodynamics,respiratory and cardiac monitoring. I spent 45  minutes of neurocritical care time performing neurological assessment, discussion with family, other specialists and medical decision making of high complexityin the care of  this patient.

## 2017-09-04 NOTE — Progress Notes (Signed)
Pt to Transfer to [place] [facility] - [place] room 28.  Report given to [facility].   Report called by Gabriel Cirri, RN at phone [phone] ast 1905.  Report given, Roselyn Reef, Therapist, sports at [facility].  Called Brother, [place] at 74.

## 2017-09-04 NOTE — ED Notes (Signed)
Number listed for emergency contact has been disconnected.

## 2017-09-04 NOTE — Procedures (Signed)
Central Venous Catheter Insertion Procedure Note Dillon Garcia [id number] March 18, 1956  Procedure: Insertion of Central Venous Catheter Indications: Assessment of intravascular volume, Drug and/or fluid administration and Frequent blood sampling  Procedure Details Consent: Risks of procedure as well as the alternatives and risks of each were explained to the (patient/caregiver).  Consent for procedure obtained. Time Out: Verified patient identification, verified procedure, site/side was marked, verified correct patient position, special equipment/implants available, medications/allergies/relevent history reviewed, required imaging and test results available.  Performed  Maximum sterile technique was used including antiseptics, cap, gloves, gown, hand hygiene, mask and sheet. Skin prep: Chlorhexidine; local anesthetic administered A antimicrobial bonded/coated triple lumen catheter was placed in the right internal jugular vein using the Seldinger technique to 17 cm.  Line sutured.  Biopatch placed and sterile dressing applied.  Evaluation Blood flow good Complications: No apparent complications Patient did tolerate procedure well. Chest X-ray ordered to verify placement.  CXR: pending.  Procedure performed with ultrasound guidance for real time vessel cannulation.      Kennieth Rad, AGACNP-BC [facility] Pgr: [phone] or if no answer [phone] 09/04/2017, 11:11 PM

## 2017-09-04 NOTE — Progress Notes (Signed)
Initial Nutrition Assessment  DOCUMENTATION CODES:   Non-severe (moderate) malnutrition in context of chronic illness  INTERVENTION:  Initiate Vital AF 1.2 at 55 mL/hr (1320 mL goal daily volume) via OGT. Provides 1584 kcal, 99 grams of protein, 1069 mL H2O daily.  Provide liquid MVI daily per tube.  Provide free water flush of 100 mL every 6 hours. Will provide a total of 1469 mL H2O daily including water in tube feeding.  Patient will be at risk for refeeding syndrome. Plan is to check another magnesium and phosphorus lab in AM.  NUTRITION DIAGNOSIS:   Moderate Malnutrition related to chronic illness(unknown etiology at this time) as evidenced by moderate fat depletion, moderate muscle depletion.  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   Patient currently intubated. He was off of sedation at time of RD assessment this morning. No family members present at bedside. RD noted muscle and subcutaneous fat wasting. Patient nodded his head "yes" when asked if he had been losing weight. There is a limited weight history in chart, but it appears he was around 140 lbs in 2016. On 08/02/2016 he was 137.1 lbs, so he has lost approximately 10 lbs (7.3% body weight) over the past year, which is not significant for time frame. Patient has right facial droop and is unable to move right side of his body.  Access: 14 Fr. OGT placed 5/20; terminates in mid stomach per abdominal x-ray 5/20; 58 cm at  corner of mouth  MAP: 81-106 mmHg  Patient is currently intubated on ventilator support Ve: 10 L/min Temp (24hrs), Avg:97.6 F (36.4 C), Min:96.8 F (36 C), Max:98.3 F (36.8 C)  Propofol: N/A  Medications reviewed and include: famotidine, fentanyl gtt, Keppra, magnesium sulfate 4 grams IV once today.  Labs reviewed: CBG 108, Magnesium 1.6, Potassium 3.4, BUN 24, Creatinine 1.28.  Discussed on rounds and with RN. Plan is to start tube feeds today.  NUTRITION - FOCUSED PHYSICAL EXAM:    Most Recent Value  Orbital Region  Moderate depletion  Upper Arm Region  Moderate depletion  Thoracic and Lumbar Region  Moderate depletion  Buccal Region  Unable to assess  Temple Region  Moderate depletion  Clavicle Bone Region  Moderate depletion  Clavicle and Acromion Bone Region  Moderate depletion  Scapular Bone Region  Unable to assess  Dorsal Hand  Moderate depletion  Patellar Region  Moderate depletion  Anterior Thigh Region  Moderate depletion  Posterior Calf Region  Moderate depletion  Edema (RD Assessment)  None  Hair  Reviewed  Eyes  Unable to assess  Mouth  Unable to assess  Skin  Reviewed  Nails  Reviewed     Diet Order:   Diet Order           Diet NPO time specified  Diet effective now          EDUCATION NEEDS:   No education needs have been identified at this time  Skin:  Skin Assessment: Reviewed RN Assessment  Last  BM:  Unknown  Height:   Ht Readings from Last 1 Encounters:  09/04/17 5\' 8"  (1.727 m)    Weight:   Wt Readings from Last 1 Encounters:  09/04/17 127 lb 3.3 oz (57.7 kg)    Ideal Body Weight:  70 kg(based on estimated height)  BMI:  Body mass index is 19.34 kg/m.  Estimated Nutritional Needs:   Kcal:  1545 (PSU 2003b w/ MSJ 1356, Ve 10, Tmax 36.8)  Protein:  87-104 grams (1.5-1.8 grams/kg)  Fluid:  1.4-1.7 L/day (25-30 mL/kg)  Willey Blade, MS, RD, LDN Office: [phone] Pager: [phone] After Hours/Weekend Pager:  [phone]

## 2017-09-04 NOTE — ED Notes (Signed)
Pt agitated, RT at bedside to suction. Large amount of dark red and clear secretions out nose and mouth.

## 2017-09-04 NOTE — ED Notes (Signed)
100 Lidocaine given at this time.

## 2017-09-04 NOTE — Consult Note (Addendum)
Referring Physician: Kasa    Chief Complaint: Right sided weakness  HPI: Dillon Garcia is an 62 y.o. male currently intubated and unable to provide history.  Family not available therefore all history obtained from the chart.  Patient was brought to the emergency department via EMS after being found in his car at [facility].  The patient was unresponsive and appeared to be shaking.  EMS believed him to have some seizure activity.  In the emergency department the patient was found to have obvious facial droop/grimace of the right side of his face.  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  Patient admitted for further evaluation.   Initial NIHSS of 22  Date last known well: Unable to determine Time last known well: Unable to determine tPA Given: No: Unable to determine LKW  Past Medical History:  Diagnosis Date  . Heart disease   . HTN (hypertension)     Past Surgical History:  Procedure Laterality Date  . MEDIAN STERNOTOMY     CABG? valve replacement?    Family history: Father with lung cancer.  Mother with cervical cancer  Social History:  has no tobacco, alcohol, and drug history on file.  Allergies: Not on File  Medications:  I have reviewed the patient's current medications. Prior to Admission:  Medications Prior to Admission  Medication Sig Dispense Refill Last Dose  . amLODipine (NORVASC) 5 MG tablet Take 5 mg by mouth daily as needed (bp > 140/90).     Marland Kitchen aspirin EC 81 MG tablet Take 81 mg by mouth daily.     Marland Kitchen atorvastatin (LIPITOR) 40 MG tablet Take 40 mg by mouth daily.     . famotidine (PEPCID) 10 MG tablet Take 10 mg by mouth at bedtime as needed for heartburn or indigestion.     Marland Kitchen lisinopril-hydrochlorothiazide (PRINZIDE,ZESTORETIC) 20-12.5 MG tablet Take 1 tablet by mouth 2 (two) times daily.     . metoprolol succinate (TOPROL-XL) 100 MG 24 hr tablet Take 100 mg by mouth daily. Take with or immediately following a meal.      Scheduled: .  [START ON 09/05/2017] atorvastatin  40 mg Per Tube q1800  . chlorhexidine gluconate (MEDLINE KIT)  15 mL Mouth Rinse BID  . famotidine  20 mg Per Tube BID  . fentaNYL (SUBLIMAZE) injection  50 mcg Intravenous Once  . free water  100 mL Per Tube Q6H  . heparin injection (subcutaneous)  5,000 Units Subcutaneous Q8H  . insulin aspart  0-15 Units Subcutaneous Q4H  . ketamine      . mouth rinse  15 mL Mouth Rinse 10 times per day  . multivitamin  15 mL Per Tube Daily    ROS: Unable to obtain  Physical Examination: Blood pressure (!) 148/106, pulse 85, temperature 98.2 F (36.8 C), temperature source Axillary, resp. rate (!) 21, height _0  (1.727 m), weight 57.7 kg (127 lb 3.3 oz), SpO2 100 %.  HEENT-  Normocephalic, no lesions, without obvious abnormality.  Normal external eye and conjunctiva.  Normal TM's bilaterally.  Normal auditory canals and external ears. Normal external nose, mucus membranes and septum.  Normal pharynx. Cardiovascular- S1, S2 normal, pulses palpable throughout   Lungs- chest clear, no wheezing, rales, normal symmetric air entry Abdomen- soft, non-tender; bowel sounds normal; no masses,  no organomegaly Extremities- no edema Lymph-no adenopathy palpable Musculoskeletal-no joint tenderness, deformity or swelling Skin-warm and dry, no hyperpigmentation, vitiligo, or suspicious lesions  Neurological Examination   Mental Status: Eyes  open.  Able to follow simple commands.  Intubated.  Cranial Nerves: JJ:KKXF no blink to bilateral confrontaqtion, pupils equal, round, reactive to light and accommodation III,IV, VI: right ptosis, extra-ocular motions grossly intact bilaterally V,VII: absent Right corneal VIII: hearing normal bilaterally IX,X: unable to test XI: unable to test XII: unable to test. Motor: Moves left upper and lower extremity against gravity.  0/5 noted in RUE, 2/5 noted in RLE Sensory: Pinprick and light touch decreased on the right upper and lower  extremities Deep Tendon Reflexes: 3+ throughout wit h sustained left ankle clonus Plantars: Right: upgoing   Left: upgoing Cerebellar: Unable to  test Gait: Unable to test  Laboratory Studies:  Basic Metabolic Panel: Recent Labs  Lab 09/04/17 0524 09/04/17 0939  NA 143  --   K 3.4*  --   CL 105  --   CO2 24  --   GLUCOSE 104*  --   BUN 24*  --   CREATININE 1.28*  --   CALCIUM 9.4  --   MG  --  1.6*  PHOS 4.3  --     Liver Function Tests: Recent Labs  Lab 09/04/17 0524  AST 36  ALT 24  ALKPHOS 60  BILITOT 1.3*  PROT 6.9  ALBUMIN 4.1   No results for input(s): LIPASE, AMYLASE in the last 168 hours. No results for input(s): AMMONIA in the last 168 hours.  CBC: Recent Labs  Lab 09/04/17 0524  WBC 13.7*  NEUTROABS 11.0*  HGB 16.9  HCT 50.2  MCV 89.2  PLT 162    Cardiac Enzymes: Recent Labs  Lab 09/04/17 0524  TROPONINI <0.03    BNP: Invalid input(s): POCBNP  CBG: Recent Labs  Lab 09/04/17 0818 09/04/17 1657  GLUCAP 108* 111*    Microbiology: Results for orders placed or performed during the hospital encounter of 09/04/17  MRSA PCR Screening     Status: None   Collection Time: 09/04/17  9:24 AM  Result Value Ref Range Status   MRSA by PCR NEGATIVE NEGATIVE Final    Comment:        The GeneXpert MRSA Assay (FDA approved for NASAL specimens only), is one component of a comprehensive MRSA colonization surveillance program. It is not intended to diagnose MRSA infection nor to guide or monitor treatment for MRSA infections. Performed at [facility], [address] [place], [place] [address]     Coagulation Studies: Recent Labs    09/04/17 0524  LABPROT 12.5  INR 0.94    Urinalysis:  Recent Labs  Lab 09/04/17 0602  COLORURINE YELLOW*  LABSPEC 1.015  PHURINE 6.0  GLUCOSEU NEGATIVE  HGBUR MODERATE*  BILIRUBINUR NEGATIVE  KETONESUR 5*  PROTEINUR NEGATIVE  NITRITE NEGATIVE  LEUKOCYTESUR NEGATIVE    Lipid  Panel: No results found for: CHOL, TRIG, HDL, CHOLHDL, VLDL, LDLCALC  HgbA1C:  Lab Results  Component Value Date   HGBA1C 5.6 09/04/2017    Urine Drug Screen:      Component Value Date/Time   LABOPIA NONE DETECTED 09/04/2017 0602   COCAINSCRNUR NONE DETECTED 09/04/2017 0602   LABBENZ POSITIVE (A) 09/04/2017 0602   AMPHETMU NONE DETECTED 09/04/2017 0602   THCU NONE DETECTED 09/04/2017 0602   LABBARB NONE DETECTED 09/04/2017 0602    Alcohol Level:  Recent Labs  Lab 09/04/17 0524  ETH <10    Other results: EKG: sinus rhythm at 89 bpm  Imaging: Ct Head Wo Contrast  Result Date: 09/04/2017 CLINICAL DATA:  Initial evaluation for acute  unresponsiveness. EXAM: CT HEAD WITHOUT CONTRAST TECHNIQUE: Contiguous axial images were obtained from the base of the skull through the vertex without intravenous contrast. COMPARISON:  None. FINDINGS: Brain: Generalized age-related cerebral atrophy. Mild chronic small vessel ischemic change present within the hemispheric cerebral white matter. Confluent hypodensity involving the superior left cerebellar hemisphere, consistent with evolving acute ischemic left superior cerebellar artery territory infarct. No associated hemorrhage. No significant mass effect at this time. Adjacent fourth ventricle remains patent. No inferior herniation. No other evidence for acute large vessel territory infarct. No intracranial hemorrhage. No mass lesion or midline shift. No hydrocephalus. No extra-axial fluid collection. Vascular: No hyperdense vessel. Scattered vascular calcifications noted within the carotid siphons. Skull: Scalp soft tissues and calvarium within normal limits. Sinuses/Orbits: Globes and orbital soft tissues within normal limits. Small air-fluid level noted within the right maxillary sinus. Scattered mucosal thickening within the ethmoidal air cells. Visualized mastoids are clear. Other: None. IMPRESSION: 1. Evolving acute ischemic infarct involving the  superior left cerebellar hemisphere, left superior cerebellar artery territory. No associated hemorrhage or significant mass effect at this time. 2. Age-related cerebral atrophy with mild chronic small vessel ischemic disease. Critical Value/emergent results were called by telephone at the time of interpretation on 09/04/2017 at 5:56 am to Dillon Garcia , who verbally acknowledged these results. Electronically Signed   By: Jeannine Boga M.D.   On: 09/04/2017 05:56   Mr Brain Wo Contrast  Result Date: 09/04/2017 CLINICAL DATA:  Follow-up LEFT cerebellar infarct. Found unresponsive in car at [employer]. Critically ill. History of hypertension. EXAM: MRI HEAD WITHOUT CONTRAST MRA HEAD WITHOUT CONTRAST TECHNIQUE: Multiplanar, multiecho pulse sequences of the brain and surrounding structures were obtained without intravenous contrast. Angiographic images of the head were obtained using MRA technique without contrast. COMPARISON:  CT HEAD Sep 04, 2017 at 0527 hours FINDINGS: Multiple sequences are moderately motion degraded. MRI HEAD FINDINGS INTRACRANIAL CONTENTS: Confluent reduced diffusion LEFT pons, LEFT cerebellum predominately involving the superior portion. Patchy reduced diffusion medial mesial RIGHT cerebellum. All areas of reduced diffusion demonstrate low ADC values and bright FLAIR signal. No susceptibility artifact to suggest hemorrhage. Fourth ventricle is open though, there is mild upward herniation and narrowed LEFT quadrigeminal cistern. No parenchymal brain volume loss for age. No supratentorial midline shift. No masses. No abnormal extra-axial fluid collections. VASCULAR: T2 bright signal LEFT C1 foramen transversarium corresponding to LEFT vertebral artery. Otherwise normal major intracranial vascular flow voids present at skull base. SKULL AND UPPER CERVICAL SPINE: No abnormal sellar expansion. No suspicious calvarial bone marrow signal. Posterior cervical spine susceptibility artifact likely  reflects hardware. Craniocervical junction maintained. SINUSES/ORBITS: Mild paranasal sinus mucosal thickening. Air-fluid level in the pharynx due to life-support lines. Mastoid air cells are well aerated.The included ocular globes and orbital contents are non-suspicious. OTHER: Life-support lines in place. MRA HEAD FINDINGS ANTERIOR CIRCULATION: Normal flow related enhancement of the included cervical, petrous, cavernous and supraclinoid internal carotid arteries. Patent anterior communicating artery. Patent anterior and middle cerebral arteries, mild luminal irregularity seen with atherosclerosis or motion artifact. No large vessel occlusion, flow limiting stenosis though limited by motion, aneurysm. POSTERIOR CIRCULATION: RIGHT vertebral artery is dominant. For flow related enhancement LEFT vertebral artery with retrograde flow at vertebrobasilar junction. Patent RIGHT vertebral artery mild luminal irregularity seen with motion artifact or atherosclerosis. No flow limiting stenosis though limited by motion, aneurysm. ANATOMIC VARIANTS: None. Source images and MIP images were reviewed. IMPRESSION: MRI HEAD: 1. Motion degraded examination. 2. Acute LEFT greater than RIGHT pontocerebellar  nonhemorrhagic infarcts, including LEFT SCA territory. Regional mass effect without obstructive hydrocephalus. MRA HEAD: 1. Slow flow versus occluded LEFT vertebral artery. 2. No flow limiting stenosis on this motion degraded examination. Electronically Signed   By: Elon Alas M.D.   On: 09/04/2017 15:33   Dg Pelvis Portable  Result Date: 09/04/2017 CLINICAL DATA:  For MRI clearance. EXAM: PORTABLE PELVIS 1-2 VIEWS COMPARISON:  None. FINDINGS: Foley catheter in place. No metallic devices or foreign bodies are visible. Bones appear normal. Bowel gas pattern is normal. IMPRESSION: Negative exam for metal in the pelvis. Electronically Signed   By: Lorriane Shire M.D.   On: 09/04/2017 09:59   Dg Chest Port 1 View  Result  Date: 09/04/2017 CLINICAL DATA:  Intubation. EXAM: PORTABLE CHEST 1 VIEW COMPARISON:  None. FINDINGS: Endotracheal tube tip just above the clavicular heads 7.4 cm from the carina. Advancement of 1-2 cm would lead to optimal placement. Post median sternotomy. Heart size is normal. No pulmonary edema. Patchy left infrahilar opacities partially obscured by overlying monitoring devices. No large pleural effusion or pneumothorax. IMPRESSION: 1. Endotracheal tube tip just above the clavicular heads 7.4 cm from the carina, advancement of 1-2 cm would lead to optimal placement. 2. Patchy left infrahilar opacities likely atelectasis, partially obscured by overlying monitoring device. Electronically Signed   By: Jeb Levering M.D.   On: 09/04/2017 06:47   Dg Abd Portable 1v  Result Date: 09/04/2017 CLINICAL DATA:  Screening for MRI. EXAM: PORTABLE ABDOMEN - 1 VIEW COMPARISON:  None. FINDINGS: Portable supine view the abdomen and pelvis shows gaseous distention of the stomach despite the presence of an NG tube with the distal tip positioned in the mid stomach. No gaseous bowel dilatation. Visualized lung bases are unremarkable. The patient does have a temperature probe overlying the inferior pelvis. Otherwise, no findings to suggest metallic foreign body in the abdomen or pelvis. IMPRESSION: 1. Apparent temperature probe overlies the inferior anatomic pelvis and appears to contain metallic wire. 2. Otherwise no unexpected metallic foreign body over the abdomen or pelvis. Electronically Signed   By: Misty Stanley M.D.   On: 09/04/2017 10:07   Mr Jodene Nam Head/brain IH Cm  Result Date: 09/04/2017 CLINICAL DATA:  Follow-up LEFT cerebellar infarct. Found unresponsive in car at [employer]. Critically ill. History of hypertension. EXAM: MRI HEAD WITHOUT CONTRAST MRA HEAD WITHOUT CONTRAST TECHNIQUE: Multiplanar, multiecho pulse sequences of the brain and surrounding structures were obtained without intravenous contrast.  Angiographic images of the head were obtained using MRA technique without contrast. COMPARISON:  CT HEAD Sep 04, 2017 at 0527 hours FINDINGS: Multiple sequences are moderately motion degraded. MRI HEAD FINDINGS INTRACRANIAL CONTENTS: Confluent reduced diffusion LEFT pons, LEFT cerebellum predominately involving the superior portion. Patchy reduced diffusion medial mesial RIGHT cerebellum. All areas of reduced diffusion demonstrate low ADC values and bright FLAIR signal. No susceptibility artifact to suggest hemorrhage. Fourth ventricle is open though, there is mild upward herniation and narrowed LEFT quadrigeminal cistern. No parenchymal brain volume loss for age. No supratentorial midline shift. No masses. No abnormal extra-axial fluid collections. VASCULAR: T2 bright signal LEFT C1 foramen transversarium corresponding to LEFT vertebral artery. Otherwise normal major intracranial vascular flow voids present at skull base. SKULL AND UPPER CERVICAL SPINE: No abnormal sellar expansion. No suspicious calvarial bone marrow signal. Posterior cervical spine susceptibility artifact likely reflects hardware. Craniocervical junction maintained. SINUSES/ORBITS: Mild paranasal sinus mucosal thickening. Air-fluid level in the pharynx due to life-support lines. Mastoid air cells are well aerated.The included ocular globes  and orbital contents are non-suspicious. OTHER: Life-support lines in place. MRA HEAD FINDINGS ANTERIOR CIRCULATION: Normal flow related enhancement of the included cervical, petrous, cavernous and supraclinoid internal carotid arteries. Patent anterior communicating artery. Patent anterior and middle cerebral arteries, mild luminal irregularity seen with atherosclerosis or motion artifact. No large vessel occlusion, flow limiting stenosis though limited by motion, aneurysm. POSTERIOR CIRCULATION: RIGHT vertebral artery is dominant. For flow related enhancement LEFT vertebral artery with retrograde flow at  vertebrobasilar junction. Patent RIGHT vertebral artery mild luminal irregularity seen with motion artifact or atherosclerosis. No flow limiting stenosis though limited by motion, aneurysm. ANATOMIC VARIANTS: None. Source images and MIP images were reviewed. IMPRESSION: MRI HEAD: 1. Motion degraded examination. 2. 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 Regional mass effect without obstructive hydrocephalus. MRA HEAD: 1. Slow flow versus occluded LEFT vertebral artery. 2. No flow limiting stenosis on this motion degraded examination. Electronically Signed   By: Elon Alas M.D.   On: 09/04/2017 15:33    Assessment: 62 y.o. male found unresponsive.  MRI of the brain reviewed and shows acute left greater than right pontocerebellar infarcts.  Left vertebral appears occluded or near occluded.  No evidence of obstructive hydrocephalus.  Suspect embolic etiology.  Concern is for development of hydrocephalus/herniation in the near future.   A1c 5.6.  LDL pending.  Stroke Risk Factors - hypertension  Plan: 1. Echocardiogram  2. Carotid dopplers 3. Prophylactic therapy-Antiplatelet med: Aspirin - dose 385m rectally 4. NPO until RN stroke swallow screen 5. Telemetry monitoring 6. Frequent neuro checks 7. If worsening neurological function would start hypertonic saline and initiate transfer.     LAlexis Goodell MD Neurology [phone]/20/2019, 5:31 PM  Addendum: Have spoken with Dr. ALorraine Laxwho will accept the patient in transfer.  In speaking with the nurse patient has made decline with unequal pupils and less responsive.  Hypertonic saline to be initiated.    LAlexis Goodell MD Neurology [phone]

## 2017-09-04 NOTE — Progress Notes (Signed)
Pharmacy ICU Monitoring Consult:  Pharmacy consulted to assist in monitoring and replacing electrolytes, glucose management, and constipation prevention  in this 62 y.o. male admitted on 09/04/2017 with CVA and possible seizure.   Patient is requiring mechanical ventilation and sedated with fentanyl drip.   Labs:  Sodium (mmol/L)  Date Value  09/04/2017 143   Potassium (mmol/L)  Date Value  09/04/2017 3.4 (L)   Magnesium (mg/dL)  Date Value  09/04/2017 1.6 (L)   Phosphorus (mg/dL)  Date Value  09/04/2017 4.3   Calcium (mg/dL)  Date Value  09/04/2017 9.4   Albumin (g/dL)  Date Value  09/04/2017 4.1    Assessment/Plan: Electrolytes: will replace potassium 57mEq VT x 1 and magnesium 4g IV x 1. Goal potassium ~ 4 and goal magnesium ~ 2. Will recheck electrolytes with am labs.   Glucose: will initiate SSI Q4hr and obtain hemoglobin A1c   Constipation: patient started on tube feeds, will start senna/docusate 1 tab BID.   Pharmacy will continue to monitor and adjust per consult.   Simpson,Michael L 09/04/2017 3:08 PM

## 2017-09-04 NOTE — ED Notes (Signed)
Foley emptied, all of belongings sent with patient. Report to [place], [place]

## 2017-09-04 NOTE — Progress Notes (Signed)
Sedation paused in order to attempt NIH neuro assessment

## 2017-09-04 NOTE — Discharge Summary (Signed)
Physician Discharge Summary  Patient ID: RICKARDO BRINEGAR MRN: [id number] DOB/AGE: 11/03/1955 62 y.o.  Admit date: 09/04/2017 Discharge date: 09/04/2017  Admission [place] [facility]A  Discharge Diagnoses:  Active Problems:   Stroke (cerebrum) [facility])   Discharged Condition:  Stable/critical  Hospital Course:  Admitted for severe resp failure needed air way protection Acute encephalopathy from acute CVA  Consults: neuro  Significant Diagnostic Studies:  CT head acute LEFT sided Cerebellar CVA MRI head c/w L>R ischemic CVA cereballar   Treatments: patient needs 3% saline and advanced Neurological support  Discharge Exam: Blood pressure (!) 148/106, pulse 85, temperature 98.2 F (36.8 C), temperature source Axillary, resp. rate (!) 21, height 5\' 8"  (1.727 m), weight 127 lb 3.3 oz (57.7 kg), SpO2 100 %.   Disposition: transfer to [facility] asap Case discussed with Dr Doy Mince Neurologist      Signed: Flora Lipps 09/04/2017, 6:46 PM

## 2017-09-04 NOTE — ED Notes (Signed)
[facility] reports 2 mg versed given in route to facility.

## 2017-09-05 ENCOUNTER — Encounter (HOSPITAL_COMMUNITY): Payer: Self-pay | Admitting: Emergency Medicine

## 2017-09-05 ENCOUNTER — Other Ambulatory Visit: Payer: Self-pay

## 2017-09-05 ENCOUNTER — Inpatient Hospital Stay (HOSPITAL_COMMUNITY): Payer: Medicare HMO

## 2017-09-05 DIAGNOSIS — I639 Cerebral infarction, unspecified: Secondary | ICD-10-CM

## 2017-09-05 DIAGNOSIS — J9601 Acute respiratory failure with hypoxia: Secondary | ICD-10-CM

## 2017-09-05 DIAGNOSIS — G936 Cerebral edema: Secondary | ICD-10-CM

## 2017-09-05 DIAGNOSIS — I63442 Cerebral infarction due to embolism of left cerebellar artery: Secondary | ICD-10-CM

## 2017-09-05 LAB — CBC
HEMATOCRIT: 44.2 % (ref 39.0–52.0)
HEMOGLOBIN: 14.5 g/dL (ref 13.0–17.0)
MCH: 29.3 pg (ref 26.0–34.0)
MCHC: 32.8 g/dL (ref 30.0–36.0)
MCV: 89.3 fL (ref 78.0–100.0)
Platelets: 139 10*3/uL — ABNORMAL LOW (ref 150–400)
RBC: 4.95 MIL/uL (ref 4.22–5.81)
RDW: 14.8 % (ref 11.5–15.5)
WBC: 13 10*3/uL — ABNORMAL HIGH (ref 4.0–10.5)

## 2017-09-05 LAB — ECHOCARDIOGRAM COMPLETE
HEIGHTINCHES: 65 in
Weight: 2010.6 oz

## 2017-09-05 LAB — BASIC METABOLIC PANEL
Anion gap: 7 (ref 5–15)
BUN: 17 mg/dL (ref 6–20)
CHLORIDE: 115 mmol/L — AB (ref 101–111)
CO2: 26 mmol/L (ref 22–32)
CREATININE: 1.4 mg/dL — AB (ref 0.61–1.24)
Calcium: 8.4 mg/dL — ABNORMAL LOW (ref 8.9–10.3)
GFR calc non Af Amer: 52 mL/min — ABNORMAL LOW (ref >60)
Glucose, Bld: 113 mg/dL — ABNORMAL HIGH (ref 65–99)
POTASSIUM: 4.8 mmol/L (ref 3.5–5.1)
Sodium: 148 mmol/L — ABNORMAL HIGH (ref 135–145)

## 2017-09-05 LAB — RAPID URINE DRUG SCREEN, HOSP PERFORMED
AMPHETAMINES: NOT DETECTED
BARBITURATES: NOT DETECTED
BENZODIAZEPINES: POSITIVE — AB
Cocaine: NOT DETECTED
Opiates: NOT DETECTED
Tetrahydrocannabinol: NOT DETECTED

## 2017-09-05 LAB — LACTIC ACID, PLASMA: Lactic Acid, Venous: 1 mmol/L (ref 0.5–1.9)

## 2017-09-05 LAB — BLOOD GAS, ARTERIAL
Acid-Base Excess: 0.7 mmol/L (ref 0.0–2.0)
Bicarbonate: 24.7 mmol/L (ref 20.0–28.0)
Drawn by: 44135
FIO2: 30
MECHVT: 490 mL
O2 Saturation: 97.7 %
PATIENT TEMPERATURE: 98.6
PCO2 ART: 39 mmHg (ref 32.0–48.0)
PEEP: 5 cmH2O
PO2 ART: 104 mmHg (ref 83.0–108.0)
RATE: 20 resp/min
pH, Arterial: 7.418 (ref 7.350–7.450)

## 2017-09-05 LAB — SODIUM
SODIUM: 158 mmol/L — AB (ref 135–145)
SODIUM: 160 mmol/L — AB (ref 135–145)

## 2017-09-05 LAB — LIPID PANEL
CHOL/HDL RATIO: 1.9 ratio
CHOLESTEROL: 79 mg/dL (ref 0–200)
HDL: 42 mg/dL (ref >40)
LDL Cholesterol: 17 mg/dL (ref 0–99)
TRIGLYCERIDES: 102 mg/dL (ref <150)
VLDL: 20 mg/dL (ref 0–40)

## 2017-09-05 LAB — GLUCOSE, CAPILLARY
GLUCOSE-CAPILLARY: 96 mg/dL (ref 65–99)
GLUCOSE-CAPILLARY: 101 mg/dL — AB (ref 65–99)
Glucose-Capillary: 110 mg/dL — ABNORMAL HIGH (ref 65–99)
Glucose-Capillary: 111 mg/dL — ABNORMAL HIGH (ref 65–99)
Glucose-Capillary: 112 mg/dL — ABNORMAL HIGH (ref 65–99)
Glucose-Capillary: 113 mg/dL — ABNORMAL HIGH (ref 65–99)

## 2017-09-05 LAB — HIV ANTIBODY (ROUTINE TESTING W REFLEX): HIV Screen 4th Generation wRfx: NONREACTIVE

## 2017-09-05 LAB — MAGNESIUM: Magnesium: 2.6 mg/dL — ABNORMAL HIGH (ref 1.7–2.4)

## 2017-09-05 LAB — HEMOGLOBIN A1C
Hgb A1c MFr Bld: 5.5 % (ref 4.8–5.6)
Mean Plasma Glucose: 111.15 mg/dL

## 2017-09-05 LAB — PHOSPHORUS: Phosphorus: 2.9 mg/dL (ref 2.5–4.6)

## 2017-09-05 MED ORDER — ASPIRIN 300 MG RE SUPP: 300.0000 mg | Freq: Every day | RECTAL | Status: DC

## 2017-09-05 MED ORDER — CHLORHEXIDINE GLUCONATE CLOTH 2 % EX PADS
6.0000 | MEDICATED_PAD | Freq: Every day | CUTANEOUS | Status: DC
  Administered 2017-09-05 – 2017-09-07 (×3): 6 via TOPICAL

## 2017-09-05 MED ORDER — ASPIRIN 325 MG PO TABS
325.0000 mg | ORAL_TABLET | Freq: Every day | ORAL | Status: DC
  Administered 2017-09-05 – 2017-09-15 (×11): 325 mg via ORAL
  Filled 2017-09-05 (×11): qty 1

## 2017-09-05 MED ORDER — SODIUM CHLORIDE 0.9% FLUSH
10.0000 mL | Freq: Two times a day (BID) | INTRAVENOUS | Status: DC
  Administered 2017-09-05 – 2017-09-10 (×11): 10 mL

## 2017-09-05 MED ORDER — HEPARIN SODIUM (PORCINE) 5000 UNIT/ML IJ SOLN
5000.0000 [IU] | Freq: Three times a day (TID) | INTRAMUSCULAR | Status: DC
  Administered 2017-09-05 – 2017-09-15 (×31): 5000 [IU] via SUBCUTANEOUS
  Filled 2017-09-05 (×29): qty 1

## 2017-09-05 MED ORDER — SODIUM CHLORIDE 0.9% FLUSH
10.0000 mL | INTRAVENOUS | Status: DC | PRN
  Administered 2017-09-07: 10 mL
  Filled 2017-09-05: qty 40

## 2017-09-05 MED ORDER — IOPAMIDOL (ISOVUE-370) INJECTION 76%
INTRAVENOUS | Status: AC
  Filled 2017-09-05: qty 50

## 2017-09-05 MED ORDER — PIPERACILLIN-TAZOBACTAM 3.375 G IVPB
3.3750 g | Freq: Three times a day (TID) | INTRAVENOUS | Status: DC
  Filled 2017-09-05: qty 50

## 2017-09-05 MED ORDER — IOPAMIDOL (ISOVUE-370) INJECTION 76%
50.0000 mL | Freq: Once | INTRAVENOUS | Status: AC | PRN
  Administered 2017-09-05: 50 mL via INTRAVENOUS

## 2017-09-05 MED ORDER — ATORVASTATIN CALCIUM 10 MG PO TABS
10.0000 mg | ORAL_TABLET | Freq: Every day | ORAL | Status: DC
  Administered 2017-09-05 – 2017-09-06 (×2): 10 mg via ORAL
  Filled 2017-09-05 (×2): qty 1

## 2017-09-05 NOTE — Progress Notes (Signed)
Pt transported to CT and back from [id number] without incident.

## 2017-09-05 NOTE — Progress Notes (Signed)
STROKE TEAM PROGRESS NOTE   SUBJECTIVE (INTERVAL HISTORY) His RN is at the bedside.  Overall he feels his condition is gradually improving. Pt off sedation and now eyes open, awake and alert, following all commands. CTA neck pending to rule out posterior circulation dissection.    OBJECTIVE Temp:  [98.1 F (36.7 C)-99.2 F (37.3 C)] 99.2 F (37.3 C) (05/21 0800) Pulse Rate:  [66-89] 86 (05/21 1100) Cardiac Rhythm: Normal sinus rhythm (05/21 0800) Resp:  [12-21] 20 (05/21 1000) BP: (98-151)/(79-107) 140/87 (05/21 1100) SpO2:  [97 %-100 %] 100 % (05/21 1100) FiO2 (%):  [30 %] 30 % (05/21 1100) Weight:  [125 lb 10.6 oz (57 kg)] 125 lb 10.6 oz (57 kg) (05/21 0224)  Recent Labs  Lab 09/04/17 1657 09/04/17 2342 09/05/17 0333 09/05/17 0746 09/05/17 1127  GLUCAP 111* 119* 110* 96 113*   Recent Labs  Lab 09/04/17 0524 09/04/17 0939 09/04/17 1834 09/04/17 2118 09/05/17 0300 09/05/17 1015  NA 143  --  142 146* 148* 158*  K 3.4*  --   --  3.5 4.8  --   CL 105  --   --  111 115*  --   CO2 24  --   --  24 26  --   GLUCOSE 104*  --   --  122* 113*  --   BUN 24*  --   --  20 17  --   CREATININE 1.28*  --   --  1.26* 1.40*  --   CALCIUM 9.4  --   --  8.8* 8.4*  --   MG  --  1.6*  --  2.6* 2.6*  --   PHOS 4.3  --   --  3.3 2.9  --    Recent Labs  Lab 09/04/17 0524 09/04/17 2118  AST 36 44*  ALT 24 24  ALKPHOS 60 59  BILITOT 1.3* 1.1  PROT 6.9 5.8*  ALBUMIN 4.1 3.5   Recent Labs  Lab 09/04/17 0524 09/04/17 2118 09/05/17 0300  WBC 13.7* 14.6* 13.0*  NEUTROABS 11.0* 11.5*  --   HGB 16.9 15.5 14.5  HCT 50.2 46.5 44.2  MCV 89.2 88.7 89.3  PLT 162 156 139*   Recent Labs  Lab 09/04/17 0524 09/04/17 2118  TROPONINI <0.03 <0.03   Recent Labs    09/04/17 0524  LABPROT 12.5  INR 0.94   Recent Labs    09/04/17 0602  COLORURINE YELLOW*  LABSPEC 1.015  PHURINE 6.0  GLUCOSEU NEGATIVE  HGBUR MODERATE*  BILIRUBINUR NEGATIVE  KETONESUR 5*  PROTEINUR NEGATIVE   NITRITE NEGATIVE  LEUKOCYTESUR NEGATIVE       Component Value Date/Time   CHOL 79 09/05/2017 0300   TRIG 102 09/05/2017 0300   HDL 42 09/05/2017 0300   CHOLHDL 1.9 09/05/2017 0300   VLDL 20 09/05/2017 0300   LDLCALC 17 09/05/2017 0300   Lab Results  Component Value Date   HGBA1C 5.5 09/05/2017      Component Value Date/Time   LABOPIA NONE DETECTED 09/04/2017 2314   COCAINSCRNUR NONE DETECTED 09/04/2017 2314   COCAINSCRNUR NONE DETECTED 09/04/2017 0602   LABBENZ POSITIVE (A) 09/04/2017 2314   AMPHETMU NONE DETECTED 09/04/2017 2314   THCU NONE DETECTED 09/04/2017 2314   LABBARB NONE DETECTED 09/04/2017 2314    Recent Labs  Lab 09/04/17 0524  ETH <10    I have personally reviewed the radiological images below and agree with the radiology interpretations.  Ct Head Wo Contrast  Result Date: 09/05/2017  CLINICAL DATA:  Follow-up examination for acute stroke. EXAM: CT HEAD WITHOUT CONTRAST TECHNIQUE: Contiguous axial images were obtained from the base of the skull through the vertex without intravenous contrast. COMPARISON:  Prior CT and MRI from 09/04/2017. FINDINGS: Brain: Continued interval evolution of left greater than right acute ischemic nonhemorrhagic bilateral cerebellar infarcts, left greater than right, with large confluent left SCA territory infarct. Associated left pontine infarct noted as well, better seen on recent MRI. Overall, size and distribution relatively similar to previous. Mildly increased localized edema with increased partial effacement of the left perimesencephalic cistern. Fourth ventricle remains widely patent. No hydrocephalus. No evidence for hemorrhagic transformation. Otherwise stable appearance of the brain. No other acute intracranial infarct or hemorrhage. No mass lesion or midline shift. No hydrocephalus. No extra-axial fluid collection. Vascular: No hyperdense vessel. Skull: Scalp soft tissues and calvarium within normal limits. Sinuses/Orbits:  Globes and orbital soft tissues demonstrate no acute finding. Mucosal thickening throughout the ethmoidal air cells and maxillary sinuses with air-fluid level within the right maxillary sinus. Mastoid air cells remain clear. Other: None. IMPRESSION: 1. Continued interval evolution of acute ischemic left greater than right pontocerebellar infarcts, with slightly increased localized edema as compared to previous. Adjacent fourth ventricle remains patent without hydrocephalus. No evidence for hemorrhagic transformation or other complication. 2. No other new acute intracranial abnormality. Electronically Signed   By: Jeannine Boga M.D.   On: 09/05/2017 01:18   Ct Head Wo Contrast  Result Date: 09/04/2017 CLINICAL DATA:  Initial evaluation for acute unresponsiveness. EXAM: CT HEAD WITHOUT CONTRAST TECHNIQUE: Contiguous axial images were obtained from the base of the skull through the vertex without intravenous contrast. COMPARISON:  None. FINDINGS: Brain: Generalized age-related cerebral atrophy. Mild chronic small vessel ischemic change present within the hemispheric cerebral white matter. Confluent hypodensity involving the superior left cerebellar hemisphere, consistent with evolving acute ischemic left superior cerebellar artery territory infarct. No associated hemorrhage. No significant mass effect at this time. Adjacent fourth ventricle remains patent. No inferior herniation. No other evidence for acute large vessel territory infarct. No intracranial hemorrhage. No mass lesion or midline shift. No hydrocephalus. No extra-axial fluid collection. Vascular: No hyperdense vessel. Scattered vascular calcifications noted within the carotid siphons. Skull: Scalp soft tissues and calvarium within normal limits. Sinuses/Orbits: Globes and orbital soft tissues within normal limits. Small air-fluid level noted within the right maxillary sinus. Scattered mucosal thickening within the ethmoidal air cells. Visualized  mastoids are clear. Other: None. IMPRESSION: 1. Evolving acute ischemic infarct involving the superior left cerebellar hemisphere, left superior cerebellar artery territory. No associated hemorrhage or significant mass effect at this time. 2. Age-related cerebral atrophy with mild chronic small vessel ischemic disease. Critical Value/emergent results were called by telephone at the time of interpretation on 09/04/2017 at 5:56 am to Dr. Lurline Hare , who verbally acknowledged these results. Electronically Signed   By: Jeannine Boga M.D.   On: 09/04/2017 05:56   Mr Brain Wo Contrast  Result Date: 09/04/2017 CLINICAL DATA:  Follow-up LEFT cerebellar infarct. Found unresponsive in car at [employer] [facility]. Critically ill. History of hypertension. EXAM: MRI HEAD WITHOUT CONTRAST MRA HEAD WITHOUT CONTRAST TECHNIQUE: Multiplanar, multiecho pulse sequences of the brain and surrounding structures were obtained without intravenous contrast. Angiographic images of the head were obtained using MRA technique without contrast. COMPARISON:  CT HEAD Sep 04, 2017 at 0527 hours FINDINGS: Multiple sequences are moderately motion degraded. MRI HEAD FINDINGS INTRACRANIAL CONTENTS: Confluent reduced diffusion LEFT pons, LEFT cerebellum predominately involving the superior portion.  Patchy reduced diffusion medial mesial RIGHT cerebellum. All areas of reduced diffusion demonstrate low ADC values and bright FLAIR signal. No susceptibility artifact to suggest hemorrhage. Fourth ventricle is open though, there is mild upward herniation and narrowed LEFT quadrigeminal cistern. No parenchymal brain volume loss for age. No supratentorial midline shift. No masses. No abnormal extra-axial fluid collections. VASCULAR: T2 bright signal LEFT C1 foramen transversarium corresponding to LEFT vertebral artery. Otherwise normal major intracranial vascular flow voids present at skull base. SKULL AND UPPER CERVICAL SPINE: No abnormal sellar expansion. No  suspicious calvarial bone marrow signal. Posterior cervical spine susceptibility artifact likely reflects hardware. Craniocervical junction maintained. SINUSES/ORBITS: Mild paranasal sinus mucosal thickening. Air-fluid level in the pharynx due to life-support lines. Mastoid air cells are well aerated.The included ocular globes and orbital contents are non-suspicious. OTHER: Life-support lines in place. MRA HEAD FINDINGS ANTERIOR CIRCULATION: Normal flow related enhancement of the included cervical, petrous, cavernous and supraclinoid internal carotid arteries. Patent anterior communicating artery. Patent anterior and middle cerebral arteries, mild luminal irregularity seen with atherosclerosis or motion artifact. No large vessel occlusion, flow limiting stenosis though limited by motion, aneurysm. POSTERIOR CIRCULATION: RIGHT vertebral artery is dominant. For flow related enhancement LEFT vertebral artery with retrograde flow at vertebrobasilar junction. Patent RIGHT vertebral artery mild luminal irregularity seen with motion artifact or atherosclerosis. No flow limiting stenosis though limited by motion, aneurysm. ANATOMIC VARIANTS: None. Source images and MIP images were reviewed. IMPRESSION: MRI HEAD: 1. Motion degraded examination. 2. 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 Regional mass effect without obstructive hydrocephalus. MRA HEAD: 1. Slow flow versus occluded LEFT vertebral artery. 2. No flow limiting stenosis on this motion degraded examination. Electronically Signed   By: Elon Alas M.D.   On: 09/04/2017 15:33   Mr Jodene Nam Head/brain WU Cm  Result Date: 09/04/2017 CLINICAL DATA:  Follow-up LEFT cerebellar infarct. Found unresponsive in car at [employer] [facility]. Critically ill. History of hypertension. EXAM: MRI HEAD WITHOUT CONTRAST MRA HEAD WITHOUT CONTRAST TECHNIQUE: Multiplanar, multiecho pulse sequences of the brain and surrounding  structures were obtained without intravenous contrast. Angiographic images of the head were obtained using MRA technique without contrast. COMPARISON:  CT HEAD Sep 04, 2017 at 0527 hours FINDINGS: Multiple sequences are moderately motion degraded. MRI HEAD FINDINGS INTRACRANIAL CONTENTS: Confluent reduced diffusion LEFT pons, LEFT cerebellum predominately involving the superior portion. Patchy reduced diffusion medial mesial RIGHT cerebellum. All areas of reduced diffusion demonstrate low ADC values and bright FLAIR signal. No susceptibility artifact to suggest hemorrhage. Fourth ventricle is open though, there is mild upward herniation and narrowed LEFT quadrigeminal cistern. No parenchymal brain volume loss for age. No supratentorial midline shift. No masses. No abnormal extra-axial fluid collections. VASCULAR: T2 bright signal LEFT C1 foramen transversarium corresponding to LEFT vertebral artery. Otherwise normal major intracranial vascular flow voids present at skull base. SKULL AND UPPER CERVICAL SPINE: No abnormal sellar expansion. No suspicious calvarial bone marrow signal. Posterior cervical spine susceptibility artifact likely reflects hardware. Craniocervical junction maintained. SINUSES/ORBITS: Mild paranasal sinus mucosal thickening. Air-fluid level in the pharynx due to life-support lines. Mastoid air cells are well aerated.The included ocular globes and orbital contents are non-suspicious. OTHER: Life-support lines in place. MRA HEAD FINDINGS ANTERIOR CIRCULATION: Normal flow related enhancement of the included cervical, petrous, cavernous and supraclinoid internal carotid arteries. Patent anterior communicating artery. Patent anterior and middle cerebral arteries, mild luminal irregularity seen with atherosclerosis or motion artifact. No large vessel occlusion, flow limiting stenosis though limited by  motion, aneurysm. POSTERIOR CIRCULATION: RIGHT vertebral artery is dominant. For flow related  enhancement LEFT vertebral artery with retrograde flow at vertebrobasilar junction. Patent RIGHT vertebral artery mild luminal irregularity seen with motion artifact or atherosclerosis. No flow limiting stenosis though limited by motion, aneurysm. ANATOMIC VARIANTS: None. Source images and MIP images were reviewed. IMPRESSION: MRI HEAD: 1. Motion degraded examination. 2. 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 Regional mass effect without obstructive hydrocephalus. MRA HEAD: 1. Slow flow versus occluded LEFT vertebral artery. 2. No flow limiting stenosis on this motion degraded examination. Electronically Signed   By: Elon Alas M.D.   On: 09/04/2017 15:33   CTA neck pending  TTE pending   PHYSICAL EXAM  Temp:  [98.1 F (36.7 C)-99.2 F (37.3 C)] 99.2 F (37.3 C) (05/21 0800) Pulse Rate:  [66-89] 86 (05/21 1100) Resp:  [12-21] 20 (05/21 1000) BP: (98-151)/(79-107) 140/87 (05/21 1100) SpO2:  [97 %-100 %] 100 % (05/21 1100) FiO2 (%):  [30 %] 30 % (05/21 1100) Weight:  [125 lb 10.6 oz (57 kg)] 125 lb 10.6 oz (57 kg) (05/21 0224)  General - Well nourished, well developed, intubated.  Ophthalmologic - fundi not visualized due to noncooperation.  Cardiovascular - Regular rate and rhythm.  Neuro - awake, alert, intubated not on sedation. Follows simple commands. PERRL, right eye abduction palsy, b/l eye right gaze with nystagmus. Blinking to visual threat bilaterally. Right facial droop. Tongue midline in mouth. LUE 4/5 at least, following all commands. LLE 3/5 at least. No babinski. RUE 0/5 and RLE withdraw to pain, with positive babinski. DTR 1+. Sensation, coordination not cooperative and gait not tested.   ASSESSMENT/PLAN Dillon Garcia is a 62 y.o. male with history of HTN and HLD admitted for unresponsive in car with questionable shaking episode, concerning for seizure. Also found to have right facial droop and was  intubated for airway protection. No tPA given due to OSW.   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 Occlusion  CTA neck pending  2D Echo  pending  LE venous doppler pending  LDL 17  [id number] 5.5  Given embolic pattern, if above test unrevealing, may consider TEE and loop  Heparin subq for VTE prophylaxis  aspirin 81 mg daily prior to admission, now on aspirin 325 mg daily.   Ongoing aggressive stroke risk factor management  Therapy recommendations:  pending  Disposition:  Pending  Cerebellar edema  MRI showed large left cerebellar infarct  No hydrocephalus  Repeat CT in am to evaluate   On 3% saline  Na 144  Na Q6h, goal 150-155  Respiratory distress  Intubated  CCM on board  Extubate as able   ? Seizure like activity  Doubt the "shaking" saw by EMS was seizure  Could be due to limb posturing due to brain stem infarct  Will do EEG   No need AED this time  Continue to monitor   Hypertension Stable Permissive hypertension (OK if <220/120) for 24-48 hours post stroke and then gradually normalized within 5-7 days.  Long term BP goal normotensive  Hyperlipidemia  Home meds:  lipitor 40   LDL 17, goal < 70  Now lipitor down to 10mg   Continue lipitor 10 on discharge  Other Stroke Risk Factors  Advanced age  Other Active Problems  Leukocytosis WBC 14.6->13.0  Hospital day # 1  This patient is critically ill due to large posterior circulation stroke, respiratory failure, cerebral edema, seizure activity and at significant risk of neurological worsening, death form hydrocephalus, recurrent stroke, hemorrhagic conversion, status epilepticus. This patient's care requires  constant monitoring of vital signs, hemodynamics, respiratory and cardiac monitoring, review of multiple databases, neurological assessment, discussion with family, other specialists and medical decision making of high complexity. I spent 40 minutes of neurocritical care time in the care of this patient.   Rosalin Hawking, MD PhD Stroke Neurology 09/05/2017 12:05 PM    To contact Stroke Continuity provider, please refer to [email or web address] After hours, contact General Neurology

## 2017-09-05 NOTE — Evaluation (Signed)
Occupational Therapy Evaluation Patient Details Name: Dillon Garcia MRN: [id number] DOB: [birth year] Today's Date: 09/05/2017    History of Present Illness 38yoM with hx HTN, HLD, CAD, GERD, and Active tobacco abuse, presents with an acute CVA.  MRI of brain showed Acute LEFT greater than RIGHT pontocerebellar nonhemorrhagic ast medical history of GERD (gastroesophageal reflux disease), Heart disease, HTN (hypertension), Hyperlipidemia, Hypothyroid, Infectious endocarditis (2012), and Mitral valve disease.  Past surgical history that includes Median sternotomy and Mitral valve replacement.   Clinical Impression   Pt admitted with above. He demonstrates the below listed deficits and will benefit from continued OT to maximize safety and independence with BADLs.  Pt seen with PT for bedside evaluation.  Pt currently intubated.  He followed one step motor commands with ~75% accuracy.   He presents with Rt hemiparesis, nystagmus, decreased activity tolerance.  He currently requires total A for ADLs.  He indicates he lives alone, and was not working.   Anticipate he will benefit from CIR.  Will follow acutely.        Follow Up Recommendations  CIR;Supervision/Assistance - 24 hour    Equipment Recommendations  None recommended by OT    Recommendations for Other Services Rehab consult     Precautions / Restrictions Precautions Precautions: Fall Precaution Comments: on vent      Mobility Bed Mobility               General bed mobility comments: deferred due to hypertonic saline and pt fatigued with in bed assessment  Transfers                 General transfer comment: bed level assessment     Balance                                           ADL either performed or assessed with clinical judgement   ADL Overall ADL's : Needs assistance/impaired                                       General ADL Comments: Pt currently requires total A  for all aspects of ADLs      Vision Baseline Vision/History: Wears glasses Additional Comments: Pt nods that he wears glasses.   He nods that vision is blurry.   Nystagmus noted in all quadrants.   He does turn head to Lt and Rt to locate people/objects      Perception     Praxis      Pertinent Vitals/Pain Pain Assessment: Faces Faces Pain Scale: Hurts little more Pain Location: R shoulder with ROM Pain Descriptors / Indicators: Grimacing Pain Intervention(s): Repositioned     Hand Dominance Right   Extremity/Trunk Assessment Upper Extremity Assessment Upper Extremity Assessment: RUE deficits/detail RUE Deficits / Details: no active movement noted.  He indicates pain with shoulder ROM at 90* RUE Coordination: decreased fine motor;decreased gross motor   Lower Extremity Assessment Lower Extremity Assessment: Defer to PT evaluation RLE Deficits / Details: activates into extension with increased time, unable to flex at knee and has increased tone throughout positive clonus RLE Coordination: decreased gross motor LLE Deficits / Details: AAROM WFL, lifts antigravity minimally and weakly from hip, strong with knee extension limited ankle DF actively LLE Coordination: decreased gross motor   Cervical /  Trunk Assessment Cervical / Trunk Assessment: Other exceptions(unable to accurately assess due to seen at bed level )   Communication Communication Communication: Other (comment)(ETT on vent)   Cognition Arousal/Alertness: Awake/alert Behavior During Therapy: WFL for tasks assessed/performed Overall Cognitive Status: Difficult to assess                                 General Comments: Pt nods yes/no and seems appropriate    General Comments  patient with downgoing vertical nystagmus and increased time with eye movement L and R; reports blurry vision and difficulty focusing, wears glasses usually    Exercises     Shoulder Instructions      Home Living  Family/patient expects to be discharged to:: Private residence Living Arrangements: Alone                               Additional Comments: patient on vent but able to answer yes/no questions; lives in [place]      Prior Functioning/Environment Level of Independence: Independent                 OT Problem List: Decreased strength;Decreased range of motion;Decreased activity tolerance;Impaired balance (sitting and/or standing);Impaired vision/perception;Decreased coordination;Decreased safety awareness;Decreased knowledge of use of DME or AE;Cardiopulmonary status limiting activity;Impaired UE functional use;Impaired tone      OT Treatment/Interventions: Self-care/ADL training;Neuromuscular education;DME and/or AE instruction;Therapeutic activities;Splinting;Visual/perceptual remediation/compensation;Cognitive remediation/compensation;Patient/family education;Balance training    OT Goals(Current goals can be found in the care plan section) Acute Rehab OT Goals Patient Stated Goal: unable to state OT Goal Formulation: Patient unable to participate in goal setting Time For Goal Achievement: 09/19/17 Potential to Achieve Goals: Good  OT Frequency: Min 2X/week   Barriers to D/C: Decreased caregiver support          Co-evaluation PT/OT/SLP Co-Evaluation/Treatment: Yes Reason for Co-Treatment: Complexity of the patient's impairments (multi-system involvement);For patient/therapist safety PT goals addressed during session: Strengthening/ROM;Mobility/safety with mobility OT goals addressed during session: Strengthening/ROM      AM-PAC PT "6 Clicks" Daily Activity     Outcome Measure Help from another person eating meals?: Total Help from another person taking care of personal grooming?: Total Help from another person toileting, which includes using toliet, bedpan, or urinal?: Total Help from another person bathing (including washing, rinsing, drying)?:  Total Help from another person to put on and taking off regular upper body clothing?: Total Help from another person to put on and taking off regular lower body clothing?: Total 6 Click Score: 6   End of Session Equipment Utilized During Treatment: Oxygen(vent ) Nurse Communication: Other (comment)(response with eval )  Activity Tolerance: Patient limited by fatigue Patient left: in bed;with restraints reapplied(mitten on Lt hand )  OT Visit Diagnosis: Hemiplegia and hemiparesis Hemiplegia - Right/Left: Right Hemiplegia - dominant/non-dominant: Dominant Hemiplegia - caused by: Cerebral infarction                Time: 5638-7564 OT Time Calculation (min): 19 min Charges:  OT General Charges $OT Visit: 1 Visit OT Evaluation $OT Eval High Complexity: 1 High G-Codes:     [facility], OTR/L [phone]   Lucille Passy M 09/05/2017, 3:09 PM

## 2017-09-05 NOTE — Progress Notes (Signed)
PULMONARY / CRITICAL CARE MEDICINE   Name: Dillon Garcia MRN: [id number] DOB: 02-Sep-1955    ADMISSION DATE:  09/04/2017  CHIEF COMPLAINT:  Found unresponsive  HISTORY OF PRESENT ILLNESS:        62yoM with hx HTN, HLD, CAD, GERD, and Active tobacco abuse, presents with an acute cerebellar CVA. Reportedly he was found unresponsive in his car outside [place] with questionable seizure activity. He was taken initially to [facility] where he was seen to have right right facial droop, and Head CT showed an acute left cerebellar infarct. He was initially following simple commands, but his mental status then began to decline. He received a bolus of mannitol on arrival here and has been started on 3% saline.      He was on Fentanyl when I arrived, which has been discontinued to facilitate an exam. He is not at all interactive.  PAST MEDICAL HISTORY :  He  has a past medical history of GERD (gastroesophageal reflux disease), Heart disease, HTN (hypertension), Hyperlipidemia, Hypothyroid, Infectious endocarditis (2012), and Mitral valve disease (2012).  PAST SURGICAL HISTORY: He  has a past surgical history that includes Median sternotomy and Mitral valve replacement (2012).  No Known Allergies  No current facility-administered medications on file prior to encounter.    Current Outpatient Medications on File Prior to Encounter  Medication Sig  . amLODipine (NORVASC) 5 MG tablet Take 5 mg by mouth daily as needed (bp > 140/90).  Marland Kitchen aspirin EC 81 MG tablet Take 81 mg by mouth daily.  Marland Kitchen atorvastatin (LIPITOR) 40 MG tablet Take 40 mg by mouth daily.  . famotidine (PEPCID) 10 MG tablet Take 10 mg by mouth at bedtime as needed for heartburn or indigestion.  Marland Kitchen lisinopril-hydrochlorothiazide (PRINZIDE,ZESTORETIC) 20-12.5 MG tablet Take 1 tablet by mouth 2 (two) times daily.  . metoprolol succinate (TOPROL-XL) 100 MG 24 hr tablet Take 100 mg by mouth daily. Take with or immediately following a meal.     FAMILY HISTORY:  His has no family status information on file.    SOCIAL HISTORY: He  reports that he has been smoking.  He uses smokeless tobacco.  REVIEW OF SYSTEMS:   Unobtainable  SUBJECTIVE:  Unobtainable  VITAL SIGNS: BP 107/80   Pulse 74   Temp 98.2 F (36.8 C) (Axillary)   Resp 20   Ht 5\' 5"  (1.651 m)   Wt 125 lb 10.6 oz (57 kg)   SpO2 99%   BMI 20.91 kg/m   HEMODYNAMICS:    VENTILATOR SETTINGS: Vent Mode: PRVC FiO2 (%):  [30 %] 30 % Set Rate:  [20 bmp] 20 bmp Vt Set:  [450 mL-490 mL] 490 mL PEEP:  [5 cmH20] 5 cmH20 Plateau Pressure:  [12 cmH20-16 cmH20] 16 cmH20  INTAKE / OUTPUT: I/O last 3 completed shifts: In: 1457.9 [I.V.:857.9; Other:250; IV Piggyback:350] Out: 6063 [Urine:975; Emesis/NG output:150]  PHYSICAL EXAMINATION: General: Orally intubated and mechanically ventilated and in no acute distress Neuro: He opens eyes to noxious stimuli and extends of the left extremities.  He is not at all interactive to voice.  He is not tracking.  He has no obvious disconjugate gaze.  Pupils are equal at 2 mm. Cardiovascular: S1 and S2 are regular without murmur rub or gallop.  There is no dependent edema or JVD Lungs: Respirations are unlabored, there is symmetric air movement, no wheezes Abdomen: The abdomen is flat and soft without any overt organomegaly masses tenderness guarding or rebound   LABS:  BMET Recent  Labs  Lab 09/04/17 0524 09/04/17 1834 09/04/17 2118 09/05/17 0300  NA 143 142 146* 148*  K 3.4*  --  3.5 4.8  CL 105  --  111 115*  CO2 24  --  24 26  BUN 24*  --  20 17  CREATININE 1.28*  --  1.26* 1.40*  GLUCOSE 104*  --  122* 113*    Electrolytes Recent Labs  Lab 09/04/17 0524 09/04/17 0939 09/04/17 2118 09/05/17 0300  CALCIUM 9.4  --  8.8* 8.4*  MG  --  1.6* 2.6* 2.6*  PHOS 4.3  --  3.3 2.9    CBC Recent Labs  Lab 09/04/17 0524 09/04/17 2118 09/05/17 0300  WBC 13.7* 14.6* 13.0*  HGB 16.9 15.5 14.5  HCT 50.2  46.5 44.2  PLT 162 156 139*    Coag's Recent Labs  Lab 09/04/17 0524  APTT 24  INR 0.94    Sepsis Markers Recent Labs  Lab 09/04/17 0602 09/04/17 0935 09/04/17 2118 09/04/17 2314  LATICACIDVEN 1.5 1.5  --  1.0  PROCALCITON  --   --  <0.10  --     ABG Recent Labs  Lab 09/04/17 0529 09/04/17 2247 09/05/17 0300  PHART 7.37 7.389 7.418  PCO2ART 44 40.7 39.0  PO2ART 106 108.0 104    Liver Enzymes Recent Labs  Lab 09/04/17 0524 09/04/17 2118  AST 36 44*  ALT 24 24  ALKPHOS 60 59  BILITOT 1.3* 1.1  ALBUMIN 4.1 3.5    Cardiac Enzymes Recent Labs  Lab 09/04/17 0524 09/04/17 2118  TROPONINI <0.03 <0.03    Glucose Recent Labs  Lab 09/04/17 0818 09/04/17 1657 09/04/17 2342 09/05/17 0333  GLUCAP 108* 111* 119* 110*    Imaging Ct Head Wo Contrast  Result Date: 09/05/2017 CLINICAL DATA:  Follow-up examination for acute stroke. EXAM: CT HEAD WITHOUT CONTRAST TECHNIQUE: Contiguous axial images were obtained from the base of the skull through the vertex without intravenous contrast. COMPARISON:  Prior CT and MRI from 09/04/2017. FINDINGS: Brain: Continued interval evolution of left greater than right acute ischemic nonhemorrhagic bilateral cerebellar infarcts, left greater than right, with large confluent left SCA territory infarct. Associated left pontine infarct noted as well, better seen on recent MRI. Overall, size and distribution relatively similar to previous. Mildly increased localized edema with increased partial effacement of the left perimesencephalic cistern. Fourth ventricle remains widely patent. No hydrocephalus. No evidence for hemorrhagic transformation. Otherwise stable appearance of the brain. No other acute intracranial infarct or hemorrhage. No mass lesion or midline shift. No hydrocephalus. No extra-axial fluid collection. Vascular: No hyperdense vessel. Skull: Scalp soft tissues and calvarium within normal limits. Sinuses/Orbits: Globes and  orbital soft tissues demonstrate no acute finding. Mucosal thickening throughout the ethmoidal air cells and maxillary sinuses with air-fluid level within the right maxillary sinus. Mastoid air cells remain clear. Other: None. IMPRESSION: 1. Continued interval evolution of acute ischemic left greater than right pontocerebellar infarcts, with slightly increased localized edema as compared to previous. Adjacent fourth ventricle remains patent without hydrocephalus. No evidence for hemorrhagic transformation or other complication. 2. No other new acute intracranial abnormality. Electronically Signed   By: Jeannine Boga M.D.   On: 09/05/2017 01:18   Mr Brain Wo Contrast  Result Date: 09/04/2017 CLINICAL DATA:  Follow-up LEFT cerebellar infarct. Found unresponsive in car at [employer] [facility]. Critically ill. History of hypertension. EXAM: MRI HEAD WITHOUT CONTRAST MRA HEAD WITHOUT CONTRAST TECHNIQUE: Multiplanar, multiecho pulse sequences of the brain and surrounding structures were obtained  without intravenous contrast. Angiographic images of the head were obtained using MRA technique without contrast. COMPARISON:  CT HEAD Sep 04, 2017 at 0527 hours FINDINGS: Multiple sequences are moderately motion degraded. MRI HEAD FINDINGS INTRACRANIAL CONTENTS: Confluent reduced diffusion LEFT pons, LEFT cerebellum predominately involving the superior portion. Patchy reduced diffusion medial mesial RIGHT cerebellum. All areas of reduced diffusion demonstrate low ADC values and bright FLAIR signal. No susceptibility artifact to suggest hemorrhage. Fourth ventricle is open though, there is mild upward herniation and narrowed LEFT quadrigeminal cistern. No parenchymal brain volume loss for age. No supratentorial midline shift. No masses. No abnormal extra-axial fluid collections. VASCULAR: T2 bright signal LEFT C1 foramen transversarium corresponding to LEFT vertebral artery. Otherwise normal major intracranial vascular flow voids  present at skull base. SKULL AND UPPER CERVICAL SPINE: No abnormal sellar expansion. No suspicious calvarial bone marrow signal. Posterior cervical spine susceptibility artifact likely reflects hardware. Craniocervical junction maintained. SINUSES/ORBITS: Mild paranasal sinus mucosal thickening. Air-fluid level in the pharynx due to life-support lines. Mastoid air cells are well aerated.The included ocular globes and orbital contents are non-suspicious. OTHER: Life-support lines in place. MRA HEAD FINDINGS ANTERIOR CIRCULATION: Normal flow related enhancement of the included cervical, petrous, cavernous and supraclinoid internal carotid arteries. Patent anterior communicating artery. Patent anterior and middle cerebral arteries, mild luminal irregularity seen with atherosclerosis or motion artifact. No large vessel occlusion, flow limiting stenosis though limited by motion, aneurysm. POSTERIOR CIRCULATION: RIGHT vertebral artery is dominant. For flow related enhancement LEFT vertebral artery with retrograde flow at vertebrobasilar junction. Patent RIGHT vertebral artery mild luminal irregularity seen with motion artifact or atherosclerosis. No flow limiting stenosis though limited by motion, aneurysm. ANATOMIC VARIANTS: None. Source images and MIP images were reviewed. IMPRESSION: MRI HEAD: 1. Motion degraded examination. 2. 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 Regional mass effect without obstructive hydrocephalus. MRA HEAD: 1. Slow flow versus occluded LEFT vertebral artery. 2. No flow limiting stenosis on this motion degraded examination. Electronically Signed   By: Elon Alas M.D.   On: 09/04/2017 15:33   Dg Pelvis Portable  Result Date: 09/04/2017 CLINICAL DATA:  For MRI clearance. EXAM: PORTABLE PELVIS 1-2 VIEWS COMPARISON:  None. FINDINGS: Foley catheter in place. No metallic devices or foreign bodies are visible. Bones appear normal.  Bowel gas pattern is normal. IMPRESSION: Negative exam for metal in the pelvis. Electronically Signed   By: Lorriane Shire M.D.   On: 09/04/2017 09:59   Dg Chest Port 1 View  Result Date: 09/04/2017 CLINICAL DATA:  Central line placement EXAM: PORTABLE CHEST 1 VIEW COMPARISON:  09/04/2017 FINDINGS: Endotracheal tube tip is about 4.1 cm superior to the carina. Esophageal tube tip extends below diaphragm but the tip is non included. Right central venous catheter tip overlies the SVC. No pneumothorax. Post sternotomy changes. Clear lung fields. Stable cardiomediastinal silhouette. IMPRESSION: 1. Right IJ central venous catheter tip overlies the SVC. Negative for pneumothorax. 2. Endotracheal tube tip about 4.1 cm superior to carina 3. Clear lung fields Electronically Signed   By: Donavan Foil M.D.   On: 09/04/2017 23:48   Dg Chest Port 1 View  Result Date: 09/04/2017 CLINICAL DATA:  Intubation. EXAM: PORTABLE CHEST 1 VIEW COMPARISON:  Chest radiograph Sep 12, 2017 at 0532 hours FINDINGS: Endotracheal tube tip projects 6.4 cm above the carina. Nasogastric tube past proximal stomach. Gas distended stomach. Cardiomediastinal silhouette is normal. Calcified aortic arch. Mild bronchitic changes without pleural effusion or focal consolidation. No pneumothorax. Osseous  structures are unchanged. IMPRESSION: Endotracheal tube tip projects 6.4 cm above the carina, consider 1-2 cm advancement. Nasogastric tube past proximal stomach, gas distended stomach. Mild bronchitic changes. Aortic Atherosclerosis (ICD10-I70.0). Electronically Signed   By: Elon Alas M.D.   On: 09/04/2017 21:58   Dg Abd Portable 1v  Result Date: 09/04/2017 CLINICAL DATA:  Screening for MRI. EXAM: PORTABLE ABDOMEN - 1 VIEW COMPARISON:  None. FINDINGS: Portable supine view the abdomen and pelvis shows gaseous distention of the stomach despite the presence of an NG tube with the distal tip positioned in the mid stomach. No gaseous bowel  dilatation. Visualized lung bases are unremarkable. The patient does have a temperature probe overlying the inferior pelvis. Otherwise, no findings to suggest metallic foreign body in the abdomen or pelvis. IMPRESSION: 1. Apparent temperature probe overlies the inferior anatomic pelvis and appears to contain metallic wire. 2. Otherwise no unexpected metallic foreign body over the abdomen or pelvis. Electronically Signed   By: Misty Stanley M.D.   On: 09/04/2017 10:07   Mr Jodene Nam Head/brain ZY Cm  Result Date: 09/04/2017 CLINICAL DATA:  Follow-up LEFT cerebellar infarct. Found unresponsive in car at [employer] [facility]. Critically ill. History of hypertension. EXAM: MRI HEAD WITHOUT CONTRAST MRA HEAD WITHOUT CONTRAST TECHNIQUE: Multiplanar, multiecho pulse sequences of the brain and surrounding structures were obtained without intravenous contrast. Angiographic images of the head were obtained using MRA technique without contrast. COMPARISON:  CT HEAD Sep 04, 2017 at 0527 hours FINDINGS: Multiple sequences are moderately motion degraded. MRI HEAD FINDINGS INTRACRANIAL CONTENTS: Confluent reduced diffusion LEFT pons, LEFT cerebellum predominately involving the superior portion. Patchy reduced diffusion medial mesial RIGHT cerebellum. All areas of reduced diffusion demonstrate low ADC values and bright FLAIR signal. No susceptibility artifact to suggest hemorrhage. Fourth ventricle is open though, there is mild upward herniation and narrowed LEFT quadrigeminal cistern. No parenchymal brain volume loss for age. No supratentorial midline shift. No masses. No abnormal extra-axial fluid collections. VASCULAR: T2 bright signal LEFT C1 foramen transversarium corresponding to LEFT vertebral artery. Otherwise normal major intracranial vascular flow voids present at skull base. SKULL AND UPPER CERVICAL SPINE: No abnormal sellar expansion. No suspicious calvarial bone marrow signal. Posterior cervical spine susceptibility artifact likely  reflects hardware. Craniocervical junction maintained. SINUSES/ORBITS: Mild paranasal sinus mucosal thickening. Air-fluid level in the pharynx due to life-support lines. Mastoid air cells are well aerated.The included ocular globes and orbital contents are non-suspicious. OTHER: Life-support lines in place. MRA HEAD FINDINGS ANTERIOR CIRCULATION: Normal flow related enhancement of the included cervical, petrous, cavernous and supraclinoid internal carotid arteries. Patent anterior communicating artery. Patent anterior and middle cerebral arteries, mild luminal irregularity seen with atherosclerosis or motion artifact. No large vessel occlusion, flow limiting stenosis though limited by motion, aneurysm. POSTERIOR CIRCULATION: RIGHT vertebral artery is dominant. For flow related enhancement LEFT vertebral artery with retrograde flow at vertebrobasilar junction. Patent RIGHT vertebral artery mild luminal irregularity seen with motion artifact or atherosclerosis. No flow limiting stenosis though limited by motion, aneurysm. ANATOMIC VARIANTS: None. Source images and MIP images were reviewed. IMPRESSION: MRI HEAD: 1. Motion degraded examination. 2. 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 Regional mass effect without obstructive hydrocephalus. MRA HEAD: 1. Slow flow versus occluded LEFT vertebral artery. 2. No flow limiting stenosis on this motion degraded examination. Electronically Signed   By: Elon Alas M.D.   On: 09/04/2017 15:33     STUDIES:   CULTURES:  ANTIBIOTICS: Zosyn started 5/20 for possible aspiration  SIGNIFICANT EVENTS:   LINES/TUBES: Right IJ CVP line placed 5/20  DISCUSSION: This is a 62 year old with a history of MVR and presumed CAD as well as active smoking who was found with altered level of consciousness.  CT scan has shown a large left cerebellar infarct with pontine involvement.  ASSESSMENT /  PLAN:  PULMONARY A: Mental status precludes extubation at this point, there is a very real risk of neurological decompensation here and I will not be attempting to wean the patient until he demonstrates at least a stable neurological status.  CARDIOVASCULAR A: Echocardiogram is pending.  He continues his statin.  I would like to reintroduce his beta-blocker should his pulse rate rise.  Antiplatelet and anti-coagulant agents per neurology.  RENAL   GASTROINTESTINAL A: Prophylaxis is with Pepcid   HEMATOLOGIC A: SCDs alone for DVT prophylaxis due to the large volume of his cerebellar infarct   INFECTIOUS A: Reviewed his chest x-ray this morning he does not appear to be evolving an infiltrate and if there is no sign of infection on 5/22 I will discontinue his Zosyn which was placed for possible aspiration   ENDOCRINE A: Sliding scale insulin is in place  NEUROLOGIC A: Large volume cerebellar infarct with a concerning potential for deterioration.  He continues hypertonic saline for now, I have decreased his fentanyl to facilitate frequent neurological examination to determine whether or not surgical intervention is indicated.  Greater than 32 minutes was spent in the care of this patient today who is requiring frequent neurological monitoring and has a very high potential for life-threatening deterioration   Lars Masson, MD  [place] Pager: [phone]  09/05/2017, 7:39 AM

## 2017-09-05 NOTE — Consult Note (Signed)
Physical Medicine and Rehabilitation Consult Reason for Consult: Right-sided weakness Referring Physician: Dr.Xu   HPI: Dillon Garcia is a 62 y.o. right-handed male with history of hypertension and tobacco abuse.  Per chart review patient lives alone was independent prior to admission.  Presented to [facility] 09/04/2017 after being found unresponsive in his car at [facility].  There was reported some seizure activity.  In the emergency room patient with noted right side weakness.  CT of the head reviewed, showing left cerebellar infarct.  He did require intubation.  Troponin negative, creatinine 1.26.  Patient was transferred to [facility] for further evaluation.  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  Regional mass-effect without obstructive hydrocephalus.  MRA showed no flow-limiting stenosis.  Bilateral lower extremity Dopplers negative for DVT.  Echocardiogram with ejection fraction of 07% grade 1 diastolic dysfunction.  EEG was negative.  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  Presently on aspirin as well as Plavix for CVA prophylaxis.  Subcutaneous heparin for DVT prophylaxis initiated 09/05/2017.  Patient did remain intubated until 09/07/2017.  A follow-up cranial CT scan 09/08/2017 showed evolving acute left greater than right cerebellar and left pontine nonhemorrhagic infarcts.  New small volume left subarachnoid hemorrhage versus thrombosed cortical vein.  Physical therapy evaluation completed  with recommendations of physical medicine rehab consult.  Review of Systems  Unable to perform ROS: Acuity of condition   Past Medical History:  Diagnosis Date  . GERD (gastroesophageal reflux disease)   . Heart disease   . HTN (hypertension)   . Hyperlipidemia   . Hypothyroid    . Infectious endocarditis 2012  . Mitral valve disease 2012   Past Surgical History:  Procedure Laterality Date  . MEDIAN STERNOTOMY     CABG? valve replacement?  Marland Kitchen MITRAL VALVE REPLACEMENT  2012   History reviewed. No pertinent family history, unable to obtain from patient. Social History:  reports that he has been smoking.  He uses smokeless tobacco. His alcohol and drug histories are not on file. Allergies: No Known Allergies Medications Prior to Admission  Medication Sig Dispense Refill  . amLODipine (NORVASC) 5 MG tablet Take 5 mg by mouth daily as needed (bp > 140/90).    Marland Kitchen aspirin EC 81 MG tablet Take 81 mg by mouth daily.    Marland Kitchen atorvastatin (LIPITOR) 40 MG tablet Take 40 mg by mouth daily.    . famotidine (PEPCID) 10 MG tablet Take 10 mg by mouth at bedtime as needed for heartburn or indigestion.    Marland Kitchen lisinopril-hydrochlorothiazide (PRINZIDE,ZESTORETIC) 20-12.5 MG tablet Take 1 tablet by mouth 2 (two) times daily.    . metoprolol succinate (TOPROL-XL) 100 MG 24 hr tablet Take 100 mg by mouth daily. Take with or immediately following a meal.      Home: Home Living Family/patient expects to be discharged to:: Private residence Living Arrangements: Alone Additional Comments: patient on vent but able to answer yes/no questions; lives in [place] History: Prior Function Level of Independence: Independent Functional Status:  Mobility: Bed Mobility General bed mobility comments: deferred due to hypertonic saline and pt fatigued with in bed assessment        ADL:    Cognition: Cognition Overall Cognitive Status: Difficult to assess Orientation Level: Intubated/Tracheostomy - Unable to assess Cognition Arousal/Alertness: Awake/alert  Behavior During Therapy: WFL for tasks assessed/performed Overall Cognitive Status: Difficult to assess Difficult to assess due to: Intubated  Blood pressure (!) 144/95, pulse 69, temperature 98.8 F (37.1 C),  temperature source Axillary, resp. rate 20, height 5\' 5"  (1.651 m), weight 57 kg (125 lb 10.6 oz), SpO2 100 %. Physical Exam  Vitals reviewed. Constitutional: He appears well-developed and well-nourished.  HENT:  Head: Normocephalic and atraumatic.  Eyes: Right eye exhibits no discharge. Left eye exhibits no discharge.  Right eye deviated medially Unable to moves eyes past midline on left  Neck: Normal range of motion. Neck supple. No thyromegaly present.  Cardiovascular: Regular rhythm.  +Bradycardia  Respiratory: Effort normal and breath sounds normal. No respiratory distress.  +[place]  GI: Soft. Bowel sounds are normal. He exhibits no distension.  Musculoskeletal:  No edema or tenderness in extremities  Neurological: He is alert.  Patient makes eye contact with examiner.   He did follow some simple commands but remained nonverbal with exam being limited Expressive aphasia Motor: RUE/RLE: 0/5 proximal to distal LUE/LLE: 4+/5 proximal to distal RUE, RLE, LUE hyperreflexic   Skin: Skin is warm and dry.  Psychiatric:  Unable to assess due to mentation    Results for orders placed or performed during the hospital encounter of 09/04/17 (from the past 24 hour(s))  Comprehensive metabolic panel     Status: Abnormal   Collection Time: 09/04/17  9:18 PM  Result Value Ref Range   Sodium 146 (H) 135 - 145 mmol/L   Potassium 3.5 3.5 - 5.1 mmol/L   Chloride 111 101 - 111 mmol/L   CO2 24 22 - 32 mmol/L   Glucose, Bld 122 (H) 65 - 99 mg/dL   BUN 20 6 - 20 mg/dL   Creatinine, Ser 1.26 (H) 0.61 - 1.24 mg/dL   Calcium 8.8 (L) 8.9 - 10.3 mg/dL   Total Protein 5.8 (L) 6.5 - 8.1 g/dL   Albumin 3.5 3.5 - 5.0 g/dL   AST 44 (H) 15 - 41 U/L   ALT 24 17 - 63 U/L   Alkaline Phosphatase 59 38 - 126 U/L   Total Bilirubin 1.1 0.3 - 1.2 mg/dL   GFR calc non Af Amer 59 (L) >60 mL/min   GFR calc Af Amer >60 >60 mL/min   Anion gap 11 5 - 15  Magnesium     Status: Abnormal   Collection Time: 09/04/17   9:18 PM  Result Value Ref Range   Magnesium 2.6 (H) 1.7 - 2.4 mg/dL  Phosphorus     Status: None   Collection Time: 09/04/17  9:18 PM  Result Value Ref Range   Phosphorus 3.3 2.5 - 4.6 mg/dL  Troponin I     Status: None   Collection Time: 09/04/17  9:18 PM  Result Value Ref Range   Troponin I <0.03 <0.03 ng/mL  Procalcitonin     Status: None   Collection Time: 09/04/17  9:18 PM  Result Value Ref Range   Procalcitonin <0.10 ng/mL  CBC WITH DIFFERENTIAL     Status: Abnormal   Collection Time: 09/04/17  9:18 PM  Result Value Ref Range   WBC 14.6 (H) 4.0 - 10.5 K/uL   RBC 5.24 4.22 - 5.81 MIL/uL   Hemoglobin 15.5 13.0 - 17.0 g/dL   HCT 46.5 39.0 - 52.0 %   MCV 88.7 78.0 - 100.0 fL   MCH 29.6 26.0 - 34.0 pg   MCHC 33.3 30.0 - 36.0 g/dL   RDW 14.7  11.5 - 15.5 %   Platelets 156 150 - 400 K/uL   Neutrophils Relative % 80 %   Neutro Abs 11.5 (H) 1.7 - 7.7 K/uL   Lymphocytes Relative 9 %   Lymphs Abs 1.3 0.7 - 4.0 K/uL   Monocytes Relative 11 %   Monocytes Absolute 1.7 (H) 0.1 - 1.0 K/uL   Eosinophils Relative 0 %   Eosinophils Absolute 0.0 0.0 - 0.7 K/uL   Basophils Relative 0 %   Basophils Absolute 0.0 0.0 - 0.1 K/uL   Immature Granulocytes 0 %   Abs Immature Granulocytes 0.1 0.0 - 0.1 K/uL  I-STAT 3, arterial blood gas (G3+)     Status: None   Collection Time: 09/04/17 10:47 PM  Result Value Ref Range   pH, Arterial 7.389 7.350 - 7.450   pCO2 arterial 40.7 32.0 - 48.0 mmHg   pO2, Arterial 108.0 83.0 - 108.0 mmHg   Bicarbonate 24.6 20.0 - 28.0 mmol/L   TCO2 26 22 - 32 mmol/L   O2 Saturation 98.0 %   Patient temperature 98.2 F    Collection site RADIAL, ALLEN'S TEST ACCEPTABLE    Drawn by RT    Sample type ARTERIAL   Lactic acid, plasma     Status: None   Collection Time: 09/04/17 11:14 PM  Result Value Ref Range   Lactic Acid, Venous 1.0 0.5 - 1.9 mmol/L  Urine rapid drug screen (hosp performed)     Status: Abnormal   Collection Time: 09/04/17 11:14 PM  Result Value  Ref Range   Opiates NONE DETECTED NONE DETECTED   Cocaine NONE DETECTED NONE DETECTED   Benzodiazepines POSITIVE (A) NONE DETECTED   Amphetamines NONE DETECTED NONE DETECTED   Tetrahydrocannabinol NONE DETECTED NONE DETECTED   Barbiturates NONE DETECTED NONE DETECTED  Glucose, capillary     Status: Abnormal   Collection Time: 09/04/17 11:42 PM  Result Value Ref Range   Glucose-Capillary 119 (H) 65 - 99 mg/dL  Hemoglobin A1c     Status: None   Collection Time: 09/05/17  3:00 AM  Result Value Ref Range   Hgb A1c MFr Bld 5.5 4.8 - 5.6 %   Mean Plasma Glucose 111.15 mg/dL  Lipid panel     Status: None   Collection Time: 09/05/17  3:00 AM  Result Value Ref Range   Cholesterol 79 0 - 200 mg/dL   Triglycerides 102 <150 mg/dL   HDL 42 >40 mg/dL   Total CHOL/HDL Ratio 1.9 RATIO   VLDL 20 0 - 40 mg/dL   LDL Cholesterol 17 0 - 99 mg/dL  CBC     Status: Abnormal   Collection Time: 09/05/17  3:00 AM  Result Value Ref Range   WBC 13.0 (H) 4.0 - 10.5 K/uL   RBC 4.95 4.22 - 5.81 MIL/uL   Hemoglobin 14.5 13.0 - 17.0 g/dL   HCT 44.2 39.0 - 52.0 %   MCV 89.3 78.0 - 100.0 fL   MCH 29.3 26.0 - 34.0 pg   MCHC 32.8 30.0 - 36.0 g/dL   RDW 14.8 11.5 - 15.5 %   Platelets 139 (L) 150 - 400 K/uL  Basic metabolic panel     Status: Abnormal   Collection Time: 09/05/17  3:00 AM  Result Value Ref Range   Sodium 148 (H) 135 - 145 mmol/L   Potassium 4.8 3.5 - 5.1 mmol/L   Chloride 115 (H) 101 - 111 mmol/L   CO2 26 22 - 32 mmol/L   Glucose, Bld 113 (  H) 65 - 99 mg/dL   BUN 17 6 - 20 mg/dL   Creatinine, Ser 1.40 (H) 0.61 - 1.24 mg/dL   Calcium 8.4 (L) 8.9 - 10.3 mg/dL   GFR calc non Af Amer 52 (L) >60 mL/min   GFR calc Af Amer >60 >60 mL/min   Anion gap 7 5 - 15  Blood gas, arterial     Status: None   Collection Time: 09/05/17  3:00 AM  Result Value Ref Range   FIO2 30.00    Delivery systems VENTILATOR    Mode PRESSURE REGULATED VOLUME CONTROL    VT 490 mL   LHR 20 resp/min   Peep/cpap 5.0 cm  H20   pH, Arterial 7.418 7.350 - 7.450   pCO2 arterial 39.0 32.0 - 48.0 mmHg   pO2, Arterial 104 83.0 - 108.0 mmHg   Bicarbonate 24.7 20.0 - 28.0 mmol/L   Acid-Base Excess 0.7 0.0 - 2.0 mmol/L   O2 Saturation 97.7 %   Patient temperature 98.6    Collection site RIGHT RADIAL    Drawn by [phone]    Sample type ARTERIAL DRAW    Allens test (pass/fail) PASS PASS  Phosphorus     Status: None   Collection Time: 09/05/17  3:00 AM  Result Value Ref Range   Phosphorus 2.9 2.5 - 4.6 mg/dL  Magnesium     Status: Abnormal   Collection Time: 09/05/17  3:00 AM  Result Value Ref Range   Magnesium 2.6 (H) 1.7 - 2.4 mg/dL  Glucose, capillary     Status: Abnormal   Collection Time: 09/05/17  3:33 AM  Result Value Ref Range   Glucose-Capillary 110 (H) 65 - 99 mg/dL  Culture, respiratory (NON-Expectorated)     Status: None (Preliminary result)   Collection Time: 09/05/17  7:28 AM  Result Value Ref Range   Specimen Description TRACHEAL ASPIRATE    Special Requests NONE    Gram Stain      ABUNDANT WBC PRESENT, PREDOMINANTLY PMN FEW GRAM POSITIVE COCCI FEW GRAM POSITIVE RODS Performed at [facility], [address], [place], [place] [address]    Culture PENDING    Report Status PENDING   Glucose, capillary     Status: None   Collection Time: 09/05/17  7:46 AM  Result Value Ref Range   Glucose-Capillary 96 65 - 99 mg/dL   Comment 1 Notify RN   Sodium     Status: Abnormal   Collection Time: 09/05/17 10:15 AM  Result Value Ref Range   Sodium 158 (H) 135 - 145 mmol/L  Glucose, capillary     Status: Abnormal   Collection Time: 09/05/17 11:27 AM  Result Value Ref Range   Glucose-Capillary 113 (H) 65 - 99 mg/dL   Comment 1 Notify RN    Ct Head Wo Contrast  Result Date: 09/05/2017 CLINICAL DATA:  Follow-up examination for acute stroke. EXAM: CT HEAD WITHOUT CONTRAST TECHNIQUE: Contiguous axial images were obtained from the base of the skull through the vertex without intravenous contrast.  COMPARISON:  Prior CT and MRI from 09/04/2017. FINDINGS: Brain: Continued interval evolution of left greater than right acute ischemic nonhemorrhagic bilateral cerebellar infarcts, left greater than right, with large confluent left SCA territory infarct. Associated left pontine infarct noted as well, better seen on recent MRI. Overall, size and distribution relatively similar to previous. Mildly increased localized edema with increased partial effacement of the left perimesencephalic cistern. Fourth ventricle remains widely patent. No hydrocephalus. No evidence for hemorrhagic  transformation. Otherwise stable appearance of the brain. No other acute intracranial infarct or hemorrhage. No mass lesion or midline shift. No hydrocephalus. No extra-axial fluid collection. Vascular: No hyperdense vessel. Skull: Scalp soft tissues and calvarium within normal limits. Sinuses/Orbits: Globes and orbital soft tissues demonstrate no acute finding. Mucosal thickening throughout the ethmoidal air cells and maxillary sinuses with air-fluid level within the right maxillary sinus. Mastoid air cells remain clear. Other: None. IMPRESSION: 1. Continued interval evolution of acute ischemic left greater than right pontocerebellar infarcts, with slightly increased localized edema as compared to previous. Adjacent fourth ventricle remains patent without hydrocephalus. No evidence for hemorrhagic transformation or other complication. 2. No other new acute intracranial abnormality. Electronically Signed   By: Jeannine Boga M.D.   On: 09/05/2017 01:18   Ct Head Wo Contrast  Result Date: 09/04/2017 CLINICAL DATA:  Initial evaluation for acute unresponsiveness. EXAM: CT HEAD WITHOUT CONTRAST TECHNIQUE: Contiguous axial images were obtained from the base of the skull through the vertex without intravenous contrast. COMPARISON:  None. FINDINGS: Brain: Generalized age-related cerebral atrophy. Mild chronic small vessel ischemic change  present within the hemispheric cerebral white matter. Confluent hypodensity involving the superior left cerebellar hemisphere, consistent with evolving acute ischemic left superior cerebellar artery territory infarct. No associated hemorrhage. No significant mass effect at this time. Adjacent fourth ventricle remains patent. No inferior herniation. No other evidence for acute large vessel territory infarct. No intracranial hemorrhage. No mass lesion or midline shift. No hydrocephalus. No extra-axial fluid collection. Vascular: No hyperdense vessel. Scattered vascular calcifications noted within the carotid siphons. Skull: Scalp soft tissues and calvarium within normal limits. Sinuses/Orbits: Globes and orbital soft tissues within normal limits. Small air-fluid level noted within the right maxillary sinus. Scattered mucosal thickening within the ethmoidal air cells. Visualized mastoids are clear. Other: None. IMPRESSION: 1. Evolving acute ischemic infarct involving the superior left cerebellar hemisphere, left superior cerebellar artery territory. No associated hemorrhage or significant mass effect at this time. 2. Age-related cerebral atrophy with mild chronic small vessel ischemic disease. Critical Value/emergent results were called by telephone at the time of interpretation on 09/04/2017 at 5:56 am to Dr. Lurline Hare , who verbally acknowledged these results. Electronically Signed   By: Jeannine Boga M.D.   On: 09/04/2017 05:56   Mr Brain Wo Contrast  Result Date: 09/04/2017 CLINICAL DATA:  Follow-up LEFT cerebellar infarct. Found unresponsive in car at [employer]. Critically ill. History of hypertension. EXAM: MRI HEAD WITHOUT CONTRAST MRA HEAD WITHOUT CONTRAST TECHNIQUE: Multiplanar, multiecho pulse sequences of the brain and surrounding structures were obtained without intravenous contrast. Angiographic images of the head were obtained using MRA technique without contrast. COMPARISON:  CT HEAD Sep 04, 2017 at 0527 hours FINDINGS: Multiple sequences are moderately motion degraded. MRI HEAD FINDINGS INTRACRANIAL CONTENTS: Confluent reduced diffusion LEFT pons, LEFT cerebellum predominately involving the superior portion. Patchy reduced diffusion medial mesial RIGHT cerebellum. All areas of reduced diffusion demonstrate low ADC values and bright FLAIR signal. No susceptibility artifact to suggest hemorrhage. Fourth ventricle is open though, there is mild upward herniation and narrowed LEFT quadrigeminal cistern. No parenchymal brain volume loss for age. No supratentorial midline shift. No masses. No abnormal extra-axial fluid collections. VASCULAR: T2 bright signal LEFT C1 foramen transversarium corresponding to LEFT vertebral artery. Otherwise normal major intracranial vascular flow voids present at skull base. SKULL AND UPPER CERVICAL SPINE: No abnormal sellar expansion. No suspicious calvarial bone marrow signal. Posterior cervical spine susceptibility artifact likely reflects hardware. Craniocervical junction maintained. SINUSES/ORBITS:  Mild paranasal sinus mucosal thickening. Air-fluid level in the pharynx due to life-support lines. Mastoid air cells are well aerated.The included ocular globes and orbital contents are non-suspicious. OTHER: Life-support lines in place. MRA HEAD FINDINGS ANTERIOR CIRCULATION: Normal flow related enhancement of the included cervical, petrous, cavernous and supraclinoid internal carotid arteries. Patent anterior communicating artery. Patent anterior and middle cerebral arteries, mild luminal irregularity seen with atherosclerosis or motion artifact. No large vessel occlusion, flow limiting stenosis though limited by motion, aneurysm. POSTERIOR CIRCULATION: RIGHT vertebral artery is dominant. For flow related enhancement LEFT vertebral artery with retrograde flow at vertebrobasilar junction. Patent RIGHT vertebral artery mild luminal irregularity seen with motion artifact or  atherosclerosis. No flow limiting stenosis though limited by motion, aneurysm. ANATOMIC VARIANTS: None. Source images and MIP images were reviewed. IMPRESSION: MRI HEAD: 1. Motion degraded examination. 2. 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 Regional mass effect without obstructive hydrocephalus. MRA HEAD: 1. Slow flow versus occluded LEFT vertebral artery. 2. No flow limiting stenosis on this motion degraded examination. Electronically Signed   By: Elon Alas M.D.   On: 09/04/2017 15:33   Dg Pelvis Portable  Result Date: 09/04/2017 CLINICAL DATA:  For MRI clearance. EXAM: PORTABLE PELVIS 1-2 VIEWS COMPARISON:  None. FINDINGS: Foley catheter in place. No metallic devices or foreign bodies are visible. Bones appear normal. Bowel gas pattern is normal. IMPRESSION: Negative exam for metal in the pelvis. Electronically Signed   By: Lorriane Shire M.D.   On: 09/04/2017 09:59   Dg Chest Port 1 View  Result Date: 09/05/2017 CLINICAL DATA:  Ventilator dependence. EXAM: PORTABLE CHEST 1 VIEW COMPARISON:  09/04/2017 FINDINGS: 0555 hours. Endotracheal tube tip is 5.8 cm above the base of the carina. The NG tube passes into the stomach although the distal tip position is not included on the film. Right IJ central line tip overlies the mid SVC. The lungs are clear without focal pneumonia, edema, pneumothorax or pleural effusion. Symmetric nodular densities overlying the lower lungs bilaterally are likely nipple shadows. Cardiopericardial silhouette is at upper limits of normal for size. The visualized bony structures of the thorax are intact. Telemetry leads overlie the chest. IMPRESSION: Stable.  No acute cardiopulmonary findings. Electronically Signed   By: Misty Stanley M.D.   On: 09/05/2017 08:19   Dg Chest Port 1 View  Result Date: 09/04/2017 CLINICAL DATA:  Central line placement EXAM: PORTABLE CHEST 1 VIEW COMPARISON:  09/04/2017 FINDINGS:  Endotracheal tube tip is about 4.1 cm superior to the carina. Esophageal tube tip extends below diaphragm but the tip is non included. Right central venous catheter tip overlies the SVC. No pneumothorax. Post sternotomy changes. Clear lung fields. Stable cardiomediastinal silhouette. IMPRESSION: 1. Right IJ central venous catheter tip overlies the SVC. Negative for pneumothorax. 2. Endotracheal tube tip about 4.1 cm superior to carina 3. Clear lung fields Electronically Signed   By: Donavan Foil M.D.   On: 09/04/2017 23:48   Dg Chest Port 1 View  Result Date: 09/04/2017 CLINICAL DATA:  Intubation. EXAM: PORTABLE CHEST 1 VIEW COMPARISON:  Chest radiograph Sep 12, 2017 at 0532 hours FINDINGS: Endotracheal tube tip projects 6.4 cm above the carina. Nasogastric tube past proximal stomach. Gas distended stomach. Cardiomediastinal silhouette is normal. Calcified aortic arch. Mild bronchitic changes without pleural effusion or focal consolidation. No pneumothorax. Osseous structures are unchanged. IMPRESSION: Endotracheal tube tip projects 6.4 cm above the carina, consider 1-2 cm advancement. Nasogastric tube past proximal stomach, gas distended  stomach. Mild bronchitic changes. Aortic Atherosclerosis (ICD10-I70.0). Electronically Signed   By: Elon Alas M.D.   On: 09/04/2017 21:58   Dg Chest Port 1 View  Result Date: 09/04/2017 CLINICAL DATA:  Intubation. EXAM: PORTABLE CHEST 1 VIEW COMPARISON:  None. FINDINGS: Endotracheal tube tip just above the clavicular heads 7.4 cm from the carina. Advancement of 1-2 cm would lead to optimal placement. Post median sternotomy. Heart size is normal. No pulmonary edema. Patchy left infrahilar opacities partially obscured by overlying monitoring devices. No large pleural effusion or pneumothorax. IMPRESSION: 1. Endotracheal tube tip just above the clavicular heads 7.4 cm from the carina, advancement of 1-2 cm would lead to optimal placement. 2. Patchy left infrahilar  opacities likely atelectasis, partially obscured by overlying monitoring device. Electronically Signed   By: Jeb Levering M.D.   On: 09/04/2017 06:47   Dg Abd Portable 1v  Result Date: 09/04/2017 CLINICAL DATA:  Screening for MRI. EXAM: PORTABLE ABDOMEN - 1 VIEW COMPARISON:  None. FINDINGS: Portable supine view the abdomen and pelvis shows gaseous distention of the stomach despite the presence of an NG tube with the distal tip positioned in the mid stomach. No gaseous bowel dilatation. Visualized lung bases are unremarkable. The patient does have a temperature probe overlying the inferior pelvis. Otherwise, no findings to suggest metallic foreign body in the abdomen or pelvis. IMPRESSION: 1. Apparent temperature probe overlies the inferior anatomic pelvis and appears to contain metallic wire. 2. Otherwise no unexpected metallic foreign body over the abdomen or pelvis. Electronically Signed   By: Misty Stanley M.D.   On: 09/04/2017 10:07   Mr Jodene Nam Head/brain JO Cm  Result Date: 09/04/2017 CLINICAL DATA:  Follow-up LEFT cerebellar infarct. Found unresponsive in car at [employer]. Critically ill. History of hypertension. EXAM: MRI HEAD WITHOUT CONTRAST MRA HEAD WITHOUT CONTRAST TECHNIQUE: Multiplanar, multiecho pulse sequences of the brain and surrounding structures were obtained without intravenous contrast. Angiographic images of the head were obtained using MRA technique without contrast. COMPARISON:  CT HEAD Sep 04, 2017 at 0527 hours FINDINGS: Multiple sequences are moderately motion degraded. MRI HEAD FINDINGS INTRACRANIAL CONTENTS: Confluent reduced diffusion LEFT pons, LEFT cerebellum predominately involving the superior portion. Patchy reduced diffusion medial mesial RIGHT cerebellum. All areas of reduced diffusion demonstrate low ADC values and bright FLAIR signal. No susceptibility artifact to suggest hemorrhage. Fourth ventricle is open though, there is mild upward herniation and narrowed LEFT  quadrigeminal cistern. No parenchymal brain volume loss for age. No supratentorial midline shift. No masses. No abnormal extra-axial fluid collections. VASCULAR: T2 bright signal LEFT C1 foramen transversarium corresponding to LEFT vertebral artery. Otherwise normal major intracranial vascular flow voids present at skull base. SKULL AND UPPER CERVICAL SPINE: No abnormal sellar expansion. No suspicious calvarial bone marrow signal. Posterior cervical spine susceptibility artifact likely reflects hardware. Craniocervical junction maintained. SINUSES/ORBITS: Mild paranasal sinus mucosal thickening. Air-fluid level in the pharynx due to life-support lines. Mastoid air cells are well aerated.The included ocular globes and orbital contents are non-suspicious. OTHER: Life-support lines in place. MRA HEAD FINDINGS ANTERIOR CIRCULATION: Normal flow related enhancement of the included cervical, petrous, cavernous and supraclinoid internal carotid arteries. Patent anterior communicating artery. Patent anterior and middle cerebral arteries, mild luminal irregularity seen with atherosclerosis or motion artifact. No large vessel occlusion, flow limiting stenosis though limited by motion, aneurysm. POSTERIOR CIRCULATION: RIGHT vertebral artery is dominant. For flow related enhancement LEFT vertebral artery with retrograde flow at vertebrobasilar junction. Patent RIGHT vertebral artery mild luminal irregularity seen with motion  artifact or atherosclerosis. No flow limiting stenosis though limited by motion, aneurysm. ANATOMIC VARIANTS: None. Source images and MIP images were reviewed. IMPRESSION: MRI HEAD: 1. Motion degraded examination. 2. 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 Regional mass effect without obstructive hydrocephalus. MRA HEAD: 1. Slow flow versus occluded LEFT vertebral artery. 2. No flow limiting stenosis on this motion degraded examination.  Electronically Signed   By: Elon Alas M.D.   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  Records reviewed and summated above Stroke: Continue secondary stroke prophylaxis and Risk Factor Modification listed below:   Antiplatelet therapy:   Blood Pressure Management:  Continue current medication with prn's with permisive HTN per primary team Statin Agent:   Tobacco abuse:   Right sided hemiparesis: fit for orthosis to prevent contractures (resting hand splint for day, wrist cock up splint at night, PRAFO, etc) Motor recovery: Fluoxetine  1. Does the need for close, 24 hr/day medical supervision in concert with the patient's rehab needs make it unreasonable for this patient to be served in a less intensive setting? Yes  2. Co-Morbidities requiring supervision/potential complications:  HTN (monitor and provide prns in accordance with increased physical exertion and pain), tobacco abuse (counsel when appropriate), seizure (consider meds), tachypnea (monitor RR and O2 Sats with increased physical exertion), bradycardia (cont to monitor HR with increased activity), obstructive hydrocephalus (monitor), diastolic dysfunction (monitor for signs/symptoms of fluid overload), hypernatremia (cont to monitor, treat if necessary) 3. Due to bladder management, bowel management, safety, skin/wound care, disease management, medication administration, pain management and patient education, does the patient require 24 hr/day rehab nursing? Yes 4. Does the patient require coordinated care of a physician, rehab nurse, PT (1-2 hrs/day, 5 days/week), OT (1-2 hrs/day, 5 days/week) and SLP (1-2 hrs/day, 5 days/week) to address physical and functional deficits in the context of the above medical diagnosis(es)? Yes Addressing deficits in the following areas: balance, endurance, locomotion,  strength, transferring, bowel/bladder control, bathing, dressing, feeding, grooming, toileting, cognition, speech, language, swallowing and psychosocial support 5. Can the patient actively participate in an intensive therapy program of at least 3 hrs of therapy per day at least 5 days per week? Potentially 6. The potential for patient to make measurable gains while on inpatient rehab is excellent 7. Anticipated functional outcomes upon discharge from inpatient rehab are min assist and mod assist  with PT, min assist and mod assist with OT, min assist and mod assist with SLP. 8. Estimated rehab length of stay to reach the above functional goals is: 23-28 days. 9. Anticipated D/C setting: TBD 10. Anticipated post D/C treatments: HH therapy and Home excercise program 11. Overall Rehab/Functional Prognosis: good  RECOMMENDATIONS: This patient's condition is appropriate for continued rehabilitative care in the following setting: CIR after completion of medical workup if caregiver support available Patient has agreed to participate in recommended program. Potentially Note that insurance prior authorization may be required for reimbursement for recommended care.  Comment: Rehab Admissions Coordinator to follow up.  I have personally performed a face to face diagnostic evaluation, including, but not limited to relevant history and physical exam findings, of this patient and developed relevant assessment and plan.  Additionally, I have reviewed and concur with the physician assistant's documentation above.   Delice Lesch, MD, ABPMR Lavon Paganini Angiulli, PA-C 09/05/2017

## 2017-09-05 NOTE — Progress Notes (Signed)
Inpatient Rehabilitation  Per PT request, patient was screened by Gauri Galvao for appropriateness for an Inpatient Acute Rehab consult.  At this time we are recommending an Inpatient Rehab consult.  Please order if you are agreeable.    Dillon Garcia, M.A., CCC/SLP Admission Coordinator  [facility]  Cell [phone]  

## 2017-09-05 NOTE — Progress Notes (Signed)
Patient transported from 4N28 to CT and back with no complications.

## 2017-09-05 NOTE — Progress Notes (Signed)
  Echocardiogram 2D Echocardiogram has been performed.  Dillon Garcia 09/05/2017, 9:50 AM

## 2017-09-05 NOTE — Progress Notes (Signed)
SLP Cancellation Note  Patient Details Name: Dillon Garcia MRN: [id number] DOB: 01/08/1956   Cancelled treatment:       Reason Eval/Treat Not Completed: Patient not medically ready; remains intubated.  Will follow for readiness.    [name] 09/05/2017, 10:02 AM

## 2017-09-05 NOTE — Progress Notes (Signed)
Bilateral lower extremity venous duplex has been completed. Negative for DVT.  09/05/17 11:42 AM Dillon Garcia RVT

## 2017-09-05 NOTE — Evaluation (Signed)
Physical Therapy Evaluation Patient Details Name: LENNEX PIETILA MRN: [id number] DOB: 1955-10-23 Today's Date: 09/05/2017   History of Present Illness  13yoM with hx HTN, HLD, CAD, GERD, and Active tobacco abuse, presents with an acute cerebellar CVA.  Past medical history of GERD (gastroesophageal reflux disease), Heart disease, HTN (hypertension), Hyperlipidemia, Hypothyroid, Infectious endocarditis (2012), and Mitral valve disease.  Past surgical history that includes Median sternotomy and Mitral valve replacement.  Clinical Impression  Patient presents with decreased mobility due to R side weakness, incoordination, decreased activity tolerance, decreased balance and will benefit from skilled PT in the acute setting to allow return home following post acute inpatient rehab.  Feel he could progress and be able to tolerate intensive interdisciplinary rehab so currently recommend CIR level rehab prior to d/c home.     Follow Up Recommendations CIR    Equipment Recommendations  Other (comment)(TBA)    Recommendations for Other Services Rehab consult     Precautions / Restrictions Precautions Precautions: Fall Precaution Comments: on vent Restrictions Weight Bearing Restrictions: No      Mobility  Bed Mobility               General bed mobility comments: deferred due to hypertonic saline and pt fatigued with in bed assessment  Transfers                    Ambulation/Gait                Stairs            Wheelchair Mobility    Modified Rankin (Stroke Patients Only) Modified Rankin (Stroke Patients Only) Pre-Morbid Rankin Score: No symptoms Modified Rankin: Severe disability     Balance                                             Pertinent Vitals/Pain Pain Assessment: Faces Faces Pain Scale: Hurts little more Pain Location: R shoulder with ROM Pain Descriptors / Indicators: Grimacing Pain Intervention(s):  Repositioned;Limited activity within patient's tolerance;Monitored during session    [place] expects to be discharged to:: Private residence Living Arrangements: Alone               Additional Comments: patient on vent but able to answer yes/no questions; lives in [place]    Prior Function Level of Independence: Independent               Journalist, newspaper        Extremity/Trunk Assessment   Upper Extremity Assessment Upper Extremity Assessment: Defer to OT evaluation    Lower Extremity Assessment Lower Extremity Assessment: RLE deficits/detail;LLE deficits/detail RLE Deficits / Details: activates into extension with increased time, unable to flex at knee and has increased tone throughout positive clonus RLE Coordination: decreased gross motor LLE Deficits / Details: AAROM WFL, lifts antigravity minimally and weakly from hip, strong with knee extension limited ankle DF actively LLE Coordination: decreased gross motor       Communication   Communication: Other (comment)(ETT)  Cognition Arousal/Alertness: Awake/alert Behavior During Therapy: WFL for tasks assessed/performed Overall Cognitive Status: Difficult to assess                                        General Comments General  comments (skin integrity, edema, etc.): patient with downgoing vertical nystagmus and increased time with eye movement L and R; reports blurry vision and difficulty focusing, wears glasses usually    Exercises     Assessment/Plan    PT Assessment Patient needs continued PT services  PT Problem List Decreased coordination;Decreased safety awareness;Decreased mobility;Decreased strength;Impaired tone;Decreased knowledge of precautions       PT Treatment Interventions DME instruction;Functional mobility training;Balance training;Patient/family education;Therapeutic activities;Neuromuscular re-education;Wheelchair mobility training;Therapeutic  exercise    PT Goals (Current goals can be found in the Care Plan section)  Acute Rehab PT Goals Patient Stated Goal: unable to state PT Goal Formulation: Patient unable to participate in goal setting Time For Goal Achievement: 09/19/17 Potential to Achieve Goals: Good    Frequency Min 4X/week   Barriers to discharge Other (comment) unknown level of support    Co-evaluation PT/OT/SLP Co-Evaluation/Treatment: Yes Reason for Co-Treatment: Complexity of the patient's impairments (multi-system involvement);For patient/therapist safety PT goals addressed during session: Strengthening/ROM;Mobility/safety with mobility         AM-PAC PT "6 Clicks" Daily Activity  Outcome Measure Difficulty turning over in bed (including adjusting bedclothes, sheets and blankets)?: Unable Difficulty moving from lying on back to sitting on the side of the bed? : Unable Difficulty sitting down on and standing up from a chair with arms (e.g., wheelchair, bedside commode, etc,.)?: Unable Help needed moving to and from a bed to chair (including a wheelchair)?: Total Help needed walking in hospital room?: Total Help needed climbing 3-5 steps with a railing? : Total 6 Click Score: 6    End of Session Equipment Utilized During Treatment: Other (comment)(vent) Activity Tolerance: Patient limited by fatigue Patient left: in bed;with restraints reapplied   PT Visit Diagnosis: Other abnormalities of gait and mobility (R26.89);Other symptoms and signs involving the nervous system (R29.898)    Time: 7124-5809 PT Time Calculation (min) (ACUTE ONLY): 19 min   Charges:   PT Evaluation $PT Eval High Complexity: 1 High     PT G CodesMagda Kiel, [place] 09/05/2017   Reginia Naas 09/05/2017, 11:59 AM

## 2017-09-05 NOTE — Procedures (Signed)
Date of recording 09/05/2017  Referring physician Dr. Erlinda Hong  Technical  Digital EEG recording using 10-20 International electrode system.  Reason for the study Altered mental status, stroke  Description of the recording When awake posterior dominant rhythm is 8 to 9 Hz symmetrical reactive and well sustained. Non-REM stage II sleep was not obtained. No localizing, lateralizing, interictal epileptiform features were seen during this recording.  Impression The EEG is normal with patient recording awake and drowsy state only.

## 2017-09-05 NOTE — Progress Notes (Signed)
EEG completed, results pending. 

## 2017-09-05 NOTE — Progress Notes (Signed)
PT Cancellation Note  Patient Details Name: Dillon Garcia MRN: [id number] DOB: 12-13-1955   Cancelled Treatment:    Reason Eval/Treat Not Completed: Patient not medically ready; continued mechanical ventilation and not ready  to wean due to mental status per MD.  Will attempt another day.   Reginia Naas 09/05/2017, 9:02 AM  Magda Kiel, [place] 09/05/2017

## 2017-09-06 ENCOUNTER — Inpatient Hospital Stay (HOSPITAL_COMMUNITY): Payer: Medicare HMO

## 2017-09-06 DIAGNOSIS — Z4659 Encounter for fitting and adjustment of other gastrointestinal appliance and device: Secondary | ICD-10-CM

## 2017-09-06 DIAGNOSIS — I6502 Occlusion and stenosis of left vertebral artery: Secondary | ICD-10-CM

## 2017-09-06 DIAGNOSIS — Z01818 Encounter for other preprocedural examination: Secondary | ICD-10-CM

## 2017-09-06 LAB — CBC
HCT: 42.4 % (ref 39.0–52.0)
Hemoglobin: 13.4 g/dL (ref 13.0–17.0)
MCH: 29.6 pg (ref 26.0–34.0)
MCHC: 31.6 g/dL (ref 30.0–36.0)
MCV: 93.6 fL (ref 78.0–100.0)
PLATELETS: 129 10*3/uL — AB (ref 150–400)
RBC: 4.53 MIL/uL (ref 4.22–5.81)
RDW: 15.6 % — AB (ref 11.5–15.5)
WBC: 10.7 10*3/uL — AB (ref 4.0–10.5)

## 2017-09-06 LAB — BASIC METABOLIC PANEL
Anion gap: 9 (ref 5–15)
BUN: 13 mg/dL (ref 6–20)
CALCIUM: 8.5 mg/dL — AB (ref 8.9–10.3)
CO2: 24 mmol/L (ref 22–32)
CREATININE: 1.26 mg/dL — AB (ref 0.61–1.24)
Chloride: 127 mmol/L — ABNORMAL HIGH (ref 101–111)
GFR, EST NON AFRICAN AMERICAN: 59 mL/min — AB (ref >60)
GLUCOSE: 106 mg/dL — AB (ref 65–99)
Potassium: 3.9 mmol/L (ref 3.5–5.1)
Sodium: 160 mmol/L — ABNORMAL HIGH (ref 135–145)

## 2017-09-06 LAB — GLUCOSE, CAPILLARY
GLUCOSE-CAPILLARY: 99 mg/dL (ref 65–99)
GLUCOSE-CAPILLARY: 117 mg/dL — AB (ref 65–99)
GLUCOSE-CAPILLARY: 105 mg/dL — AB (ref 65–99)
Glucose-Capillary: 86 mg/dL (ref 65–99)
Glucose-Capillary: 82 mg/dL (ref 65–99)

## 2017-09-06 LAB — SODIUM
Sodium: 161 mmol/L (ref 135–145)
Sodium: 161 mmol/L (ref 135–145)

## 2017-09-06 MED ORDER — CLOPIDOGREL BISULFATE 75 MG PO TABS
75.0000 mg | ORAL_TABLET | Freq: Every day | ORAL | Status: DC
  Administered 2017-09-06 – 2017-09-07 (×2): 75 mg via ORAL
  Filled 2017-09-06 (×3): qty 1

## 2017-09-06 MED ORDER — SODIUM CHLORIDE 0.45 % IV SOLN
INTRAVENOUS | Status: DC
  Administered 2017-09-06: 16:00:00 via INTRAVENOUS

## 2017-09-06 MED ORDER — VITAL AF 1.2 CAL PO LIQD
1000.0000 mL | ORAL | Status: DC
  Administered 2017-09-06: 1000 mL

## 2017-09-06 MED ORDER — PANTOPRAZOLE SODIUM 40 MG PO PACK
40.0000 mg | PACK | Freq: Every day | ORAL | Status: DC
  Administered 2017-09-06 – 2017-09-14 (×8): 40 mg
  Filled 2017-09-06 (×8): qty 20

## 2017-09-06 MED ORDER — ADULT MULTIVITAMIN LIQUID CH
15.0000 mL | Freq: Every day | ORAL | Status: DC
  Administered 2017-09-06 – 2017-09-14 (×8): 15 mL
  Filled 2017-09-06 (×9): qty 15

## 2017-09-06 NOTE — Progress Notes (Signed)
Discussed foley catheter and central line catheter removal with MD. Orders to leave CVC in place. Will remove foley catheter today.

## 2017-09-06 NOTE — Progress Notes (Signed)
Pt transported from 4N28 to CT and back with no complications.

## 2017-09-06 NOTE — Evaluation (Signed)
Physical Therapy Evaluation Patient Details Name: Dillon Garcia MRN: [id number] DOB: 08/07/1955 Today's Date: 09/06/2017   History of Present Illness  49yoM with hx HTN, HLD, CAD, GERD, and Active tobacco abuse, presents with an acute CVA.  MRI of brain showed Acute LEFT greater than RIGHT pontocerebellar nonhemorrhagic ast medical history of GERD (gastroesophageal reflux disease), Heart disease, HTN (hypertension), Hyperlipidemia, Hypothyroid, Infectious endocarditis (2012), and Mitral valve disease.  Past surgical history that includes Median sternotomy and Mitral valve replacement.  Clinical Impression  Pt following commands and was able to tolerate sitting EOB x 8 min with maxA. Pt with difficulty focusing with eyes and unable to follow finger. Pt able to initiate kicking R LE out today however required posterior lean and con't to demo'd spasticity/increased tone. Pt remains on the vent, per RN they are trying to wean him again today. Acute PT to con't to follow.    Follow Up Recommendations CIR    Equipment Recommendations  Other (comment)(TBD)    Recommendations for Other Services Rehab consult     Precautions / Restrictions Precautions Precautions: Fall Precaution Comments: on vent Restrictions Weight Bearing Restrictions: No      Mobility  Bed Mobility Overal bed mobility: Needs Assistance Bed Mobility: Rolling;Supine to Sit;Sit to Supine Rolling: Total assist;+2 for physical assistance   Supine to sit: Total assist;+2 for physical assistance Sit to supine: Total assist;+2 for physical assistance   General bed mobility comments: pt unable to initiate transfer, R UE and LE with spasticity and increased tone  Transfers                 General transfer comment: unable this date  Ambulation/Gait                Stairs            Wheelchair Mobility    Modified Rankin (Stroke Patients Only) Modified Rankin (Stroke Patients Only) Pre-Morbid Rankin  Score: No symptoms Modified Rankin: Severe disability     Balance Overall balance assessment: Needs assistance Sitting-balance support: Feet supported;Bilateral upper extremity supported Sitting balance-Leahy Scale: Poor Sitting balance - Comments: pt required maxA to maintain eob balance.  Postural control: Posterior lean;Left lateral lean                                   Pertinent Vitals/Pain Pain Assessment: Faces Faces Pain Scale: Hurts little more Pain Location: grimacing with movement in general    Home Living                        Prior Function                 Hand Dominance        Extremity/Trunk Assessment                Communication      Cognition Arousal/Alertness: Awake/alert Behavior During Therapy: WFL for tasks assessed/performed Overall Cognitive Status: Difficult to assess                                 General Comments: pt followed 1 step commands consistently. due to intubation pt only able to nod head yes/no but appears appropriate and accurate      General Comments      Exercises General Exercises - Lower Extremity Long Arc  Quad: Left;Right;Both;5 reps;Seated   Assessment/Plan    PT Assessment    PT Problem List         PT Treatment Interventions      PT Goals (Current goals can be found in the Care Plan section)  Acute Rehab PT Goals Patient Stated Goal: unable to state    Frequency Min 4X/week   Barriers to discharge        Co-evaluation               AM-PAC PT "6 Clicks" Daily Activity  Outcome Measure Difficulty turning over in bed (including adjusting bedclothes, sheets and blankets)?: Unable Difficulty moving from lying on back to sitting on the side of the bed? : Unable Difficulty sitting down on and standing up from a chair with arms (e.g., wheelchair, bedside commode, etc,.)?: Unable Help needed moving to and from a bed to chair (including a wheelchair)?:  Total Help needed walking in hospital room?: Total Help needed climbing 3-5 steps with a railing? : Total 6 Click Score: 6    End of Session Equipment Utilized During Treatment: (vent) Activity Tolerance: Patient limited by fatigue Patient left: in bed;with restraints reapplied Nurse Communication: Mobility status      Time: 0600-4599 PT Time Calculation (min) (ACUTE ONLY): 18 min   Charges:     PT Treatments $Therapeutic Activity: 8-22 mins   PT G Codes:        Kittie Plater, PT, DPT Pager #: [phone] Office #: [phone]   [place] 09/06/2017, 10:30 AM

## 2017-09-06 NOTE — Progress Notes (Signed)
STROKE TEAM PROGRESS NOTE   SUBJECTIVE (INTERVAL HISTORY) His RN is at the bedside.  Overall his condition is stable. Pt off sedation and awake and alert, following all commands. Not able to extubate today. CTA neck showed left VA occlusion. CT stable no hydrocephalus. Na high and now on 1/2 NS. Neuro stable.     OBJECTIVE Temp:  [97.9 F (36.6 C)-99.7 F (37.6 C)] 99.5 F (37.5 C) (05/22 1228) Pulse Rate:  [58-95] 72 (05/22 1300) Cardiac Rhythm: Normal sinus rhythm (05/22 1200) Resp:  [8-26] 20 (05/22 1300) BP: (131-165)/(79-100) 163/91 (05/22 1300) SpO2:  [96 %-100 %] 100 % (05/22 1300) FiO2 (%):  [30 %] 30 % (05/22 1200) Weight:  [126 lb 5.2 oz (57.3 kg)] 126 lb 5.2 oz (57.3 kg) (05/22 0500)  Recent Labs  Lab 09/05/17 2018 09/05/17 2357 09/06/17 0424 09/06/17 0744 09/06/17 1232  GLUCAP 112* 111* 99 86 117*   Recent Labs  Lab 09/04/17 0524 09/04/17 0939  09/04/17 2118 09/05/17 0300 09/05/17 1015 09/05/17 2021 09/06/17 0301 09/06/17 0929  NA 143  --    < > 146* 148* 158* 160* 160* 161*  K 3.4*  --   --  3.5 4.8  --   --  3.9  --   CL 105  --   --  111 115*  --   --  127*  --   CO2 24  --   --  24 26  --   --  24  --   GLUCOSE 104*  --   --  122* 113*  --   --  106*  --   BUN 24*  --   --  20 17  --   --  13  --   CREATININE 1.28*  --   --  1.26* 1.40*  --   --  1.26*  --   CALCIUM 9.4  --   --  8.8* 8.4*  --   --  8.5*  --   MG  --  1.6*  --  2.6* 2.6*  --   --   --   --   PHOS 4.3  --   --  3.3 2.9  --   --   --   --    < > = values in this interval not displayed.   Recent Labs  Lab 09/04/17 0524 09/04/17 2118  AST 36 44*  ALT 24 24  ALKPHOS 60 59  BILITOT 1.3* 1.1  PROT 6.9 5.8*  ALBUMIN 4.1 3.5   Recent Labs  Lab 09/04/17 0524 09/04/17 2118 09/05/17 0300 09/06/17 0301  WBC 13.7* 14.6* 13.0* 10.7*  NEUTROABS 11.0* 11.5*  --   --   HGB 16.9 15.5 14.5 13.4  HCT 50.2 46.5 44.2 42.4  MCV 89.2 88.7 89.3 93.6  PLT 162 156 139* 129*   Recent Labs   Lab 09/04/17 0524 09/04/17 2118  TROPONINI <0.03 <0.03   Recent Labs    09/04/17 0524  LABPROT 12.5  INR 0.94   Recent Labs    09/04/17 0602  COLORURINE YELLOW*  LABSPEC 1.015  PHURINE 6.0  GLUCOSEU NEGATIVE  HGBUR MODERATE*  BILIRUBINUR NEGATIVE  KETONESUR 5*  PROTEINUR NEGATIVE  NITRITE NEGATIVE  LEUKOCYTESUR NEGATIVE       Component Value Date/Time   CHOL 79 09/05/2017 0300   TRIG 102 09/05/2017 0300   HDL 42 09/05/2017 0300   CHOLHDL 1.9 09/05/2017 0300   VLDL 20 09/05/2017 0300   LDLCALC 17 09/05/2017  0300   Lab Results  Component Value Date   HGBA1C 5.5 09/05/2017      Component Value Date/Time   LABOPIA NONE DETECTED 09/04/2017 2314   COCAINSCRNUR NONE DETECTED 09/04/2017 2314   COCAINSCRNUR NONE DETECTED 09/04/2017 0602   LABBENZ POSITIVE (A) 09/04/2017 2314   AMPHETMU NONE DETECTED 09/04/2017 2314   THCU NONE DETECTED 09/04/2017 2314   LABBARB NONE DETECTED 09/04/2017 2314    Recent Labs  Lab 09/04/17 0524  ETH <10    I have personally reviewed the radiological images below and agree with the radiology interpretations.  Ct Head Wo Contrast  Result Date: 09/05/2017 CLINICAL DATA:  Follow-up examination for acute stroke. EXAM: CT HEAD WITHOUT CONTRAST TECHNIQUE: Contiguous axial images were obtained from the base of the skull through the vertex without intravenous contrast. COMPARISON:  Prior CT and MRI from 09/04/2017. FINDINGS: Brain: Continued interval evolution of left greater than right acute ischemic nonhemorrhagic bilateral cerebellar infarcts, left greater than right, with large confluent left SCA territory infarct. Associated left pontine infarct noted as well, better seen on recent MRI. Overall, size and distribution relatively similar to previous. Mildly increased localized edema with increased partial effacement of the left perimesencephalic cistern. Fourth ventricle remains widely patent. No hydrocephalus. No evidence for hemorrhagic  transformation. Otherwise stable appearance of the brain. No other acute intracranial infarct or hemorrhage. No mass lesion or midline shift. No hydrocephalus. No extra-axial fluid collection. Vascular: No hyperdense vessel. Skull: Scalp soft tissues and calvarium within normal limits. Sinuses/Orbits: Globes and orbital soft tissues demonstrate no acute finding. Mucosal thickening throughout the ethmoidal air cells and maxillary sinuses with air-fluid level within the right maxillary sinus. Mastoid air cells remain clear. Other: None. IMPRESSION: 1. Continued interval evolution of acute ischemic left greater than right pontocerebellar infarcts, with slightly increased localized edema as compared to previous. Adjacent fourth ventricle remains patent without hydrocephalus. No evidence for hemorrhagic transformation or other complication. 2. No other new acute intracranial abnormality. Electronically Signed   By: Jeannine Boga M.D.   On: 09/05/2017 01:18   Ct Head Wo Contrast  Result Date: 09/04/2017 CLINICAL DATA:  Initial evaluation for acute unresponsiveness. EXAM: CT HEAD WITHOUT CONTRAST TECHNIQUE: Contiguous axial images were obtained from the base of the skull through the vertex without intravenous contrast. COMPARISON:  None. FINDINGS: Brain: Generalized age-related cerebral atrophy. Mild chronic small vessel ischemic change present within the hemispheric cerebral white matter. Confluent hypodensity involving the superior left cerebellar hemisphere, consistent with evolving acute ischemic left superior cerebellar artery territory infarct. No associated hemorrhage. No significant mass effect at this time. Adjacent fourth ventricle remains patent. No inferior herniation. No other evidence for acute large vessel territory infarct. No intracranial hemorrhage. No mass lesion or midline shift. No hydrocephalus. No extra-axial fluid collection. Vascular: No hyperdense vessel. Scattered vascular  calcifications noted within the carotid siphons. Skull: Scalp soft tissues and calvarium within normal limits. Sinuses/Orbits: Globes and orbital soft tissues within normal limits. Small air-fluid level noted within the right maxillary sinus. Scattered mucosal thickening within the ethmoidal air cells. Visualized mastoids are clear. Other: None. IMPRESSION: 1. Evolving acute ischemic infarct involving the superior left cerebellar hemisphere, left superior cerebellar artery territory. No associated hemorrhage or significant mass effect at this time. 2. Age-related cerebral atrophy with mild chronic small vessel ischemic disease. Critical Value/emergent results were called by telephone at the time of interpretation on 09/04/2017 at 5:56 am to Dr. Lurline Hare , who verbally acknowledged these results. Electronically Signed  By: Jeannine Boga M.D.   On: 09/04/2017 05:56   Mr Brain Wo Contrast  Result Date: 09/04/2017 CLINICAL DATA:  Follow-up LEFT cerebellar infarct. Found unresponsive in car at [employer]. Critically ill. History of hypertension. EXAM: MRI HEAD WITHOUT CONTRAST MRA HEAD WITHOUT CONTRAST TECHNIQUE: Multiplanar, multiecho pulse sequences of the brain and surrounding structures were obtained without intravenous contrast. Angiographic images of the head were obtained using MRA technique without contrast. COMPARISON:  CT HEAD Sep 04, 2017 at 0527 hours FINDINGS: Multiple sequences are moderately motion degraded. MRI HEAD FINDINGS INTRACRANIAL CONTENTS: Confluent reduced diffusion LEFT pons, LEFT cerebellum predominately involving the superior portion. Patchy reduced diffusion medial mesial RIGHT cerebellum. All areas of reduced diffusion demonstrate low ADC values and bright FLAIR signal. No susceptibility artifact to suggest hemorrhage. Fourth ventricle is open though, there is mild upward herniation and narrowed LEFT quadrigeminal cistern. No parenchymal brain volume loss for age. No supratentorial  midline shift. No masses. No abnormal extra-axial fluid collections. VASCULAR: T2 bright signal LEFT C1 foramen transversarium corresponding to LEFT vertebral artery. Otherwise normal major intracranial vascular flow voids present at skull base. SKULL AND UPPER CERVICAL SPINE: No abnormal sellar expansion. No suspicious calvarial bone marrow signal. Posterior cervical spine susceptibility artifact likely reflects hardware. Craniocervical junction maintained. SINUSES/ORBITS: Mild paranasal sinus mucosal thickening. Air-fluid level in the pharynx due to life-support lines. Mastoid air cells are well aerated.The included ocular globes and orbital contents are non-suspicious. OTHER: Life-support lines in place. MRA HEAD FINDINGS ANTERIOR CIRCULATION: Normal flow related enhancement of the included cervical, petrous, cavernous and supraclinoid internal carotid arteries. Patent anterior communicating artery. Patent anterior and middle cerebral arteries, mild luminal irregularity seen with atherosclerosis or motion artifact. No large vessel occlusion, flow limiting stenosis though limited by motion, aneurysm. POSTERIOR CIRCULATION: RIGHT vertebral artery is dominant. For flow related enhancement LEFT vertebral artery with retrograde flow at vertebrobasilar junction. Patent RIGHT vertebral artery mild luminal irregularity seen with motion artifact or atherosclerosis. No flow limiting stenosis though limited by motion, aneurysm. ANATOMIC VARIANTS: None. Source images and MIP images were reviewed. IMPRESSION: MRI HEAD: 1. Motion degraded examination. 2. 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 Regional mass effect without obstructive hydrocephalus. MRA HEAD: 1. Slow flow versus occluded LEFT vertebral artery. 2. No flow limiting stenosis on this motion degraded examination. Electronically Signed   By: Elon Alas M.D.   On: 09/04/2017 15:33   Mr Jodene Nam  Head/brain YN Cm  Result Date: 09/04/2017 CLINICAL DATA:  Follow-up LEFT cerebellar infarct. Found unresponsive in car at [employer]. Critically ill. History of hypertension. EXAM: MRI HEAD WITHOUT CONTRAST MRA HEAD WITHOUT CONTRAST TECHNIQUE: Multiplanar, multiecho pulse sequences of the brain and surrounding structures were obtained without intravenous contrast. Angiographic images of the head were obtained using MRA technique without contrast. COMPARISON:  CT HEAD Sep 04, 2017 at 0527 hours FINDINGS: Multiple sequences are moderately motion degraded. MRI HEAD FINDINGS INTRACRANIAL CONTENTS: Confluent reduced diffusion LEFT pons, LEFT cerebellum predominately involving the superior portion. Patchy reduced diffusion medial mesial RIGHT cerebellum. All areas of reduced diffusion demonstrate low ADC values and bright FLAIR signal. No susceptibility artifact to suggest hemorrhage. Fourth ventricle is open though, there is mild upward herniation and narrowed LEFT quadrigeminal cistern. No parenchymal brain volume loss for age. No supratentorial midline shift. No masses. No abnormal extra-axial fluid collections. VASCULAR: T2 bright signal LEFT C1 foramen transversarium corresponding to LEFT vertebral artery. Otherwise normal major intracranial vascular flow voids present at skull base.  SKULL AND UPPER CERVICAL SPINE: No abnormal sellar expansion. No suspicious calvarial bone marrow signal. Posterior cervical spine susceptibility artifact likely reflects hardware. Craniocervical junction maintained. SINUSES/ORBITS: Mild paranasal sinus mucosal thickening. Air-fluid level in the pharynx due to life-support lines. Mastoid air cells are well aerated.The included ocular globes and orbital contents are non-suspicious. OTHER: Life-support lines in place. MRA HEAD FINDINGS ANTERIOR CIRCULATION: Normal flow related enhancement of the included cervical, petrous, cavernous and supraclinoid internal carotid arteries. Patent  anterior communicating artery. Patent anterior and middle cerebral arteries, mild luminal irregularity seen with atherosclerosis or motion artifact. No large vessel occlusion, flow limiting stenosis though limited by motion, aneurysm. POSTERIOR CIRCULATION: RIGHT vertebral artery is dominant. For flow related enhancement LEFT vertebral artery with retrograde flow at vertebrobasilar junction. Patent RIGHT vertebral artery mild luminal irregularity seen with motion artifact or atherosclerosis. No flow limiting stenosis though limited by motion, aneurysm. ANATOMIC VARIANTS: None. Source images and MIP images were reviewed. IMPRESSION: MRI HEAD: 1. Motion degraded examination. 2. 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 Regional mass effect without obstructive hydrocephalus. MRA HEAD: 1. Slow flow versus occluded LEFT vertebral artery. 2. No flow limiting stenosis on this motion degraded examination. Electronically Signed   By: Elon Alas M.D.   On: 09/04/2017 15:33   Ct Head Wo Contrast  Result Date: 09/06/2017 CLINICAL DATA:  Follow-up stroke. History of hypertension, hyperlipidemia, LEFT vertebral artery occlusion. EXAM: CT HEAD WITHOUT CONTRAST TECHNIQUE: Contiguous axial images were obtained from the base of the skull through the vertex without intravenous contrast. COMPARISON:  CT HEAD Sep 05, 2017. FINDINGS: BRAIN: Evolving acute large LEFT and small RIGHT cerebellar and LEFT pontine nonhemorrhagic infarcts. Mild mass effect on the fourth ventricle and cerebral aqua duct which remain patent. No hydrocephalus. No intraparenchymal hemorrhage. Old small RIGHT frontal lobe better seen today. No parenchymal brain volume loss for age. No supratentorial midline shift or mass effect. No abnormal extra-axial fluid collections. Basal cisterns are patent. VASCULAR: Unremarkable. SKULL: No skull fracture. No significant scalp soft tissue swelling.  SINUSES/ORBITS: Mild paranasal sinus mucosal thickening. Mastoid air cells are well aerated.The included ocular globes and orbital contents are non-suspicious. OTHER: None. IMPRESSION: 1. Evolving acute LEFT greater than RIGHT cerebellar and LEFT pontine nonhemorrhagic infarcts. Similar cytotoxic edema with mild mass effect on fourth ventricle. No hydrocephalus. Electronically Signed   By: Elon Alas M.D.   On: 09/06/2017 05:20   Ct Angio Neck W Or Wo Contrast  Result Date: 09/05/2017 CLINICAL DATA:  62 y/o  M; follow-up of stroke. EXAM: CT ANGIOGRAPHY NECK TECHNIQUE: Multidetector CT imaging of the neck was performed using the standard protocol during bolus administration of intravenous contrast. Multiplanar CT image reconstructions and MIPs were obtained to evaluate the vascular anatomy. Carotid stenosis measurements (when applicable) are obtained utilizing NASCET criteria, using the distal internal carotid diameter as the denominator. CONTRAST:  22mL ISOVUE-370 IOPAMIDOL (ISOVUE-370) INJECTION 76% COMPARISON:  09/04/2017 MRI and MRA of the head. 09/05/2017 CT of the head. FINDINGS: Aortic arch: Bovine variant branching. Imaged portion shows no evidence of aneurysm or dissection. No significant stenosis of the major arch vessel origins. Mild calcific atherosclerosis. Right carotid system: No evidence of dissection, stenosis (50% or greater) or occlusion. Left carotid system: No evidence of dissection, stenosis (50% or greater) or occlusion. Vertebral arteries: Left vertebral artery origin occlusion/near occlusion, short segment of patency of V1, occlusion of V2 to the C2 level, patent vertebral artery from the C2 level to the  vertebrobasilar junction. Poor opacification of the distal V2, V3, and V4 segments, probably due to retrograde flow. No evidence of dissection, stenosis (50% or greater) or occlusion of right vertebral artery. Skeleton: C2-C5 posterior instrumented fusion and C3-4 interbody fusion  with laminectomy. C7-T1 grade 1 anterolisthesis with prominent facet arthropathy. Other neck: Right central venous catheter tip extends into the SVC below the field of view. Enteric tube within the esophagus extending below the field of view. Endotracheal tube tip 4 cm above carina. Upper chest: Negative. IMPRESSION: 1. 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 2. Separate segment of left vertebral artery origin occlusion/near occlusion. 3. Patent distal left V2, V3, and V4 with hypoenhancement, probably representing retrograde flow. 4. Patent right vertebral and bilateral carotid systems without significant stenosis by NASCET criteria. Electronically Signed   By: Kristine Garbe M.D.   On: 09/05/2017 18:16   TTE - Left ventricle: The cavity size was normal. Wall thickness was   normal. Systolic function was vigorous. The estimated ejection   fraction was in the range of 65% to 70%. Wall motion was normal;   there were no regional wall motion abnormalities. Doppler   parameters are consistent with abnormal left ventricular   relaxation (grade 1 diastolic dysfunction). - Mitral valve: A bioprosthesis was present and functioning   normally. Mean gradient (D): 3 mm Hg. Valve area by continuity   equation (using LVOT flow): 1.88 cm^2. Impressions: - No cardiac source of emboli was indentified.  LE venous doppler  Right: There is no evidence of deep vein thrombosis in the lower extremity.There is no evidence of superficial venous thrombosis. No cystic structure found in the popliteal fossa. Left: There is no evidence of deep vein thrombosis in the lower extremity.There is no evidence of superficial venous thrombosis. No cystic structure found in the popliteal fossa.  EEG The EEG is normal with patient recording awake and drowsy state only.   PHYSICAL EXAM  Temp:  [97.9 F  (36.6 C)-99.7 F (37.6 C)] 99.5 F (37.5 C) (05/22 1228) Pulse Rate:  [58-95] 72 (05/22 1300) Resp:  [8-26] 20 (05/22 1300) BP: (131-165)/(79-100) 163/91 (05/22 1300) SpO2:  [96 %-100 %] 100 % (05/22 1300) FiO2 (%):  [30 %] 30 % (05/22 1200) Weight:  [126 lb 5.2 oz (57.3 kg)] 126 lb 5.2 oz (57.3 kg) (05/22 0500)  General - Well nourished, well developed, intubated.  Ophthalmologic - fundi not visualized due to noncooperation.  Cardiovascular - Regular rate and rhythm.  Neuro - awake, alert, intubated not off sedation. Follows all simple commands. PERRL, both eyes left gaze palsy, b/l eye right, up and downward gaze with nystagmus. Blinking to visual threat bilaterally. Right facial droop. Tongue midline in mouth. LUE 4/5, following all commands. LLE 3/5 at least. No babinski. RUE 0/5 and RLE withdraw to pain, with positive babinski. DTR 1+. Sensation, coordination not cooperative and gait not tested.   ASSESSMENT/PLAN [name] is a 62 y.o. male with history of HTN and HLD admitted for unresponsive in car with questionable shaking episode, concerning for seizure. Also found to have right facial droop and was intubated for airway protection. No tPA given due to OSW.   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 cardioembolic  Resultant intubated, eye movement difficulty, right facial droop, right hemiplegia  MRI left SCA and left pontine large infarcts as well as punctate right PICA infarct  MRA  Left VA slow flow vs. Occlusion  CTA neck left V4 and VA origin occlusion  2D Echo EF 65-70%  LE venous doppler no DVT  LDL 17  HgbA1c 5.5  May still consider TEE and loop once medical stabilized  Heparin subq for VTE prophylaxis  aspirin 81 mg daily prior to admission, now on aspirin 325 mg daily and plavix daily. Continue DAPT for 3 months and then plavix  alone  Ongoing aggressive stroke risk factor management  Therapy recommendations:  pending  Disposition:  Pending  Cerebellar edema  MRI showed large left cerebellar infarct  Repeat CT stable no hydrocephalus  Off 3% saline due to high Na - now on 1/2NS  Na 144->158->161  Na Q6h, goal 150-155  Respiratory distress  Intubated  CCM on board  Not able to extubate today  Extubate as able   Seizure-like activity  Doubt the "shaking" saw by EMS was seizure  Could be due to limb posturing due to brain stem infarct  EEG normal   No need AED this time  Continue to monitor   Hypertension Stable Permissive hypertension (OK if <220/120) for 24-48 hours post stroke and then gradually normalized within 5-7 days.  Long term BP goal normotensive  Hyperlipidemia  Home meds:  lipitor 40   LDL 17, goal < 70  Now lipitor down to 10 mg  Continue lipitor 10 on discharge  Other Stroke Risk Factors  Advanced age  Other Active Problems  Leukocytosis WBC 14.6->13.0->10.7  AKI Cre 1.40->1.26  Hospital day # 2  This patient is critically ill due to large posterior circulation stroke, respiratory failure, cerebral edema, seizure activity and at significant risk of neurological worsening, death form hydrocephalus, recurrent stroke, hemorrhagic conversion, status epilepticus. This patient's care requires constant monitoring of vital signs, hemodynamics, respiratory and cardiac monitoring, review of multiple databases, neurological assessment, discussion with family, other specialists and medical decision making of high complexity. I spent 35 minutes of neurocritical care time in the care of this patient.   Rosalin Hawking, MD PhD Stroke Neurology 09/06/2017 1:57 PM    To contact Stroke Continuity provider, please refer to [email or web address] After hours, contact General Neurology

## 2017-09-06 NOTE — Progress Notes (Signed)
Initial Nutrition Assessment  DOCUMENTATION CODES:   Non-severe (moderate) malnutrition in context of chronic illness  INTERVENTION:   Initiate Vital AF 1.2 at 55 mL/hr (1320 mL goal daily volume) via OGT.  MVI daily  Provides 1584 kcal, 99 grams of protein, 1069 mL H2O daily.  Monitor magnesium and phosphorus every 12 hours for 4 occurances, MD to replete as needed, as pt is at risk for refeeding syndrome given moderate malnutrition.  NUTRITION DIAGNOSIS:   Moderate Malnutrition related to chronic illness as evidenced by moderate muscle depletion, moderate fat depletion.  GOAL:   Patient will meet greater than or equal to 90% of their needs  MONITOR:   Vent status, I & O's, TF tolerance  REASON FOR ASSESSMENT:   Consult, Ventilator Enteral/tube feeding initiation and management  ASSESSMENT:   Pt with PMH of HTN, HLD, CAD, GERD, active tobacco abuse, hypothyroidism, MV disorder s/p cardiac valve replacement admitted with acute L cerebellar territory infarct. Pt intubated and transferred to [facility] from [facility].    Pt discussed during ICU rounds and with RN.  Spoke with MD, ok to feed as he does not expect extubation today.  No family present.   Patient is currently intubated on ventilator support MV: 9.4 L/min Temp (24hrs), Avg:98.8 F (37.1 C), Min:97.9 F (36.6 C), Max:99.7 F (37.6 C)  Medications reviewed and include: colace, 1-3 units novolog every 4 hr Currently off 3% Labs reviewed: Na 161 (H)    NUTRITION - FOCUSED PHYSICAL EXAM:    Most Recent Value  Orbital Region  Moderate depletion  Upper Arm Region  Moderate depletion  Thoracic and Lumbar Region  Moderate depletion  Buccal Region  Unable to assess  Temple Region  Moderate depletion  Clavicle Bone Region  Moderate depletion  Clavicle and Acromion Bone Region  Moderate depletion  Scapular Bone Region  Unable to assess  Dorsal Hand  Moderate depletion  Patellar Region  Moderate depletion  Anterior  Thigh Region  Moderate depletion  Posterior Calf Region  Moderate depletion  Edema (RD Assessment)  None  Hair  Reviewed  Eyes  Unable to assess  Mouth  Unable to assess  Skin  Reviewed  Nails  Reviewed       Diet Order:   Diet Order           Diet NPO time specified  Diet effective now          EDUCATION NEEDS:   No education needs have been identified at this time  Skin:  Skin Assessment: Reviewed RN Assessment  Last BM:  unknown  Height:   Ht Readings from Last 1 Encounters:  09/06/17 5\' 8"  (1.727 m)    Weight:   Wt Readings from Last 1 Encounters:  09/06/17 126 lb 5.2 oz (57.3 kg)    Ideal Body Weight:  70 kg  BMI:  Body mass index is 19.21 kg/m.  Estimated Nutritional Needs:   Kcal:  1611  Protein:  87-104  Fluid:  > 1.5 L/day  Maylon Peppers RD, LDN, CNSC [phone] Pager [phone] After Hours Pager

## 2017-09-06 NOTE — Plan of Care (Signed)
Patient with  no acute changes overnight. Patient more alert and following commands throughout night. Patient did require ET tube exchange and MD noted swelling in airway. Will continue to monitor.

## 2017-09-06 NOTE — Procedures (Signed)
Endotracheal Exchange/Intubation Procedure Note ETT had significant cuff leak with unequal VTI/VTE ETT at 25 at lip 7.5 cuffed tube. AMBU bag, Yankauer suction, and back up intubation kit all at bedside before proceeding. ETT exchange attempted over bougie Unable to advance ETT to prior position converted procedure into an intubation Pt had no episodes of hemodynamic instability or desaturation  Indication for endotracheal intubation: ETT cuff leak. Airway Assessment: Mallampati Class: II (hard and soft palate, upper portion of tonsils anduvula visible). Sedation: fentanyl and midazolam. Paralytic: none. Lidocaine: no. Atropine: no. Equipment: Macintosh 4 laryngoscope blade and 7.35m cuffed endotracheal tube. Cricoid Pressure: no. Number of attempts: 1. ETT location confirmed by by auscultation, by CXR and ETCO2 monitor.  KRise Garcia 09/06/2017

## 2017-09-06 NOTE — Progress Notes (Signed)
PULMONARY / CRITICAL CARE MEDICINE   Name: Dillon Garcia MRN: [id number] DOB: Sep 06, 1955    ADMISSION DATE:  09/04/2017  CHIEF COMPLAINT:  Found unresponsive  HISTORY OF PRESENT ILLNESS:        62yoM with hx HTN, HLD, CAD, GERD, and Active tobacco abuse, presents with an acute cerebellar CVA. Reportedly he was found unresponsive in his car outside [place] with questionable seizure activity. He was taken initially to [place] where he was seen to have right right facial droop, and Head CT showed an acute left cerebellar infarct. He was initially following simple commands, but his mental status then began to decline. He received a bolus of mannitol on arrival here and has been started on 3% saline.      He is on no sedation this morning, and despite significant bilateral cerebellar infarcts, is awake and interactive.  He was reintubated for cuff leak last night   PAST MEDICAL HISTORY :  He  has a past medical history of GERD (gastroesophageal reflux disease), Heart disease, HTN (hypertension), Hyperlipidemia, Hypothyroid, Infectious endocarditis (2012), and Mitral valve disease (2012).  PAST SURGICAL HISTORY: He  has a past surgical history that includes Median sternotomy and Mitral valve replacement (2012).  No Known Allergies  No current facility-administered medications on file prior to encounter.    Current Outpatient Medications on File Prior to Encounter  Medication Sig  . amLODipine (NORVASC) 5 MG tablet Take 10 mg by mouth daily.   Marland Kitchen aspirin EC 81 MG tablet Take 81 mg by mouth daily.  . famotidine (PEPCID) 10 MG tablet Take 10 mg by mouth at bedtime as needed for heartburn or indigestion.  Marland Kitchen lisinopril-hydrochlorothiazide (PRINZIDE,ZESTORETIC) 20-12.5 MG tablet Take 2 tablets by mouth daily.   . metoprolol succinate (TOPROL-XL) 100 MG 24 hr tablet Take 100 mg by mouth daily. Take with or immediately following a meal.    FAMILY HISTORY:  His has no family status  information on file.    SOCIAL HISTORY: He  reports that he has been smoking.  He uses smokeless tobacco.  REVIEW OF SYSTEMS:   Unobtainable  SUBJECTIVE:  Unobtainable  VITAL SIGNS: BP (!) 151/97   Pulse 84   Temp 99.7 F (37.6 C) (Axillary)   Resp (!) 9   Ht 5\' 5"  (1.651 m)   Wt 126 lb 5.2 oz (57.3 kg)   SpO2 100%   BMI 21.02 kg/m    HEMODYNAMICS:    VENTILATOR SETTINGS: Vent Mode: CPAP;PSV FiO2 (%):  [30 %] 30 % Set Rate:  [20 bmp] 20 bmp Vt Set:  [490 mL] 490 mL PEEP:  [5 cmH20] 5 cmH20 Pressure Support:  [10 cmH20] 10 cmH20 Plateau Pressure:  [15 cmH20-18 cmH20] 15 cmH20  INTAKE / OUTPUT: I/O last 3 completed shifts: In: 2481.5 [I.V.:1881.5; Other:250; IV Piggyback:350] Out: 7106 [Urine:2375; Emesis/NG output:225]  PHYSICAL EXAMINATION: General: Orally intubated and mechanically ventilated.  He is in no distress on CPAP.   Neuro: He is nodding appropriately to questions for me.  He is able to move the right but not the left extremities. Cardiovascular: S1 and S2 are regular without murmur rub or gallop.  There is no JVD or dependent edema  Lungs: He has a class I airway.  Respirations are unlabored, there is symmetric air movement, no wheezes.   Abdomen: The abdomen is flat and soft without any overt organomegaly masses tenderness guarding or rebound   LABS:  BMET Recent Labs  Lab 09/04/17 2118 09/05/17 0300  09/05/17 2021 09/06/17 0301 09/06/17 0929  NA 146* 148*   < > 160* 160* 161*  K 3.5 4.8  --   --  3.9  --   CL 111 115*  --   --  127*  --   CO2 24 26  --   --  24  --   BUN 20 17  --   --  13  --   CREATININE 1.26* 1.40*  --   --  1.26*  --   GLUCOSE 122* 113*  --   --  106*  --    < > = values in this interval not displayed.    Electrolytes Recent Labs  Lab 09/04/17 0524 09/04/17 0939 09/04/17 2118 09/05/17 0300 09/06/17 0301  CALCIUM 9.4  --  8.8* 8.4* 8.5*  MG  --  1.6* 2.6* 2.6*  --   PHOS 4.3  --  3.3 2.9  --      CBC Recent Labs  Lab 09/04/17 2118 09/05/17 0300 09/06/17 0301  WBC 14.6* 13.0* 10.7*  HGB 15.5 14.5 13.4  HCT 46.5 44.2 42.4  PLT 156 139* 129*    Coag's Recent Labs  Lab 09/04/17 0524  APTT 24  INR 0.94    Sepsis Markers Recent Labs  Lab 09/04/17 0602 09/04/17 0935 09/04/17 2118 09/04/17 2314  LATICACIDVEN 1.5 1.5  --  1.0  PROCALCITON  --   --  <0.10  --     ABG Recent Labs  Lab 09/04/17 0529 09/04/17 2247 09/05/17 0300  PHART 7.37 7.389 7.418  PCO2ART 44 40.7 39.0  PO2ART 106 108.0 104    Liver Enzymes Recent Labs  Lab 09/04/17 0524 09/04/17 2118  AST 36 44*  ALT 24 24  ALKPHOS 60 59  BILITOT 1.3* 1.1  ALBUMIN 4.1 3.5    Cardiac Enzymes Recent Labs  Lab 09/04/17 0524 09/04/17 2118  TROPONINI <0.03 <0.03    Glucose Recent Labs  Lab 09/05/17 1127 09/05/17 1719 09/05/17 2018 09/05/17 2357 09/06/17 0424 09/06/17 0744  GLUCAP 113* 101* 112* 111* 99 86    Imaging Ct Head Wo Contrast  Result Date: 09/06/2017 CLINICAL DATA:  Follow-up stroke. History of hypertension, hyperlipidemia, LEFT vertebral artery occlusion. EXAM: CT HEAD WITHOUT CONTRAST TECHNIQUE: Contiguous axial images were obtained from the base of the skull through the vertex without intravenous contrast. COMPARISON:  CT HEAD Sep 05, 2017. FINDINGS: BRAIN: Evolving acute large LEFT and small RIGHT cerebellar and LEFT pontine nonhemorrhagic infarcts. Mild mass effect on the fourth ventricle and cerebral aqua duct which remain patent. No hydrocephalus. No intraparenchymal hemorrhage. Old small RIGHT frontal lobe better seen today. No parenchymal brain volume loss for age. No supratentorial midline shift or mass effect. No abnormal extra-axial fluid collections. Basal cisterns are patent. VASCULAR: Unremarkable. SKULL: No skull fracture. No significant scalp soft tissue swelling. SINUSES/ORBITS: Mild paranasal sinus mucosal thickening. Mastoid air cells are well aerated.The  included ocular globes and orbital contents are non-suspicious. OTHER: None. IMPRESSION: 1. Evolving acute LEFT greater than RIGHT cerebellar and LEFT pontine nonhemorrhagic infarcts. Similar cytotoxic edema with mild mass effect on fourth ventricle. No hydrocephalus. Electronically Signed   By: Elon Alas M.D.   On: 09/06/2017 05:20   Ct Angio Neck W Or Wo Contrast  Result Date: 09/05/2017 CLINICAL DATA:  62 y/o  M; follow-up of stroke. EXAM: CT ANGIOGRAPHY NECK TECHNIQUE: Multidetector CT imaging of the neck was performed using the standard protocol during bolus administration of intravenous contrast. Multiplanar CT image  reconstructions and MIPs were obtained to evaluate the vascular anatomy. Carotid stenosis measurements (when applicable) are obtained utilizing NASCET criteria, using the distal internal carotid diameter as the denominator. CONTRAST:  60mL ISOVUE-370 IOPAMIDOL (ISOVUE-370) INJECTION 76% COMPARISON:  09/04/2017 MRI and MRA of the head. 09/05/2017 CT of the head. FINDINGS: Aortic arch: Bovine variant branching. Imaged portion shows no evidence of aneurysm or dissection. No significant stenosis of the major arch vessel origins. Mild calcific atherosclerosis. Right carotid system: No evidence of dissection, stenosis (50% or greater) or occlusion. Left carotid system: No evidence of dissection, stenosis (50% or greater) or occlusion. Vertebral arteries: Left vertebral artery origin occlusion/near occlusion, short segment of patency of V1, occlusion of V2 to the C2 level, patent vertebral artery from the C2 level to the vertebrobasilar junction. Poor opacification of the distal V2, V3, and V4 segments, probably due to retrograde flow. No evidence of dissection, stenosis (50% or greater) or occlusion of right vertebral artery. Skeleton: C2-C5 posterior instrumented fusion and C3-4 interbody fusion with laminectomy. C7-T1 grade 1 anterolisthesis with prominent facet arthropathy. Other neck:  Right central venous catheter tip extends into the SVC below the field of view. Enteric tube within the esophagus extending below the field of view. Endotracheal tube tip 4 cm above carina. Upper chest: Negative. IMPRESSION: 1. 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 2. Separate segment of left vertebral artery origin occlusion/near occlusion. 3. Patent distal left V2, V3, and V4 with hypoenhancement, probably representing retrograde flow. 4. Patent right vertebral and bilateral carotid systems without significant stenosis by NASCET criteria. Electronically Signed   By: Kristine Garbe M.D.   On: 09/05/2017 18:16   Dg Chest Port 1 View  Result Date: 09/06/2017 CLINICAL DATA:  Encounter for intubation. EXAM: PORTABLE CHEST 1 VIEW COMPARISON:  Yesterday at 05:55 hour FINDINGS: Endotracheal tube tip 5.3 cm from the carina. Tip and side port of the enteric tube below the diaphragm in the stomach. Right internal jugular central venous catheter tip in the mid SVC. Patient is post median sternotomy. Normal heart size. Atherosclerosis of the aortic arch. Mild hyperinflation. No consolidation, pleural effusion or pneumothorax. IMPRESSION: 1. Endotracheal tube, enteric tube, and right central line in appropriate position. 2. Otherwise unchanged appearance of the chest without acute abnormality. Electronically Signed   By: Jeb Levering M.D.   On: 09/06/2017 01:51     STUDIES:  CT this morning shows progression of bilateral cerebellar infarcts left greater than right with left pontine involvement there is no hydrocephalus CULTURES: Sputum of 5/21 is growing moderate Haemophilus which is a beta-lactamase producer ANTIBIOTICS: Zosyn started 5/20 for possible aspiration  SIGNIFICANT EVENTS:   LINES/TUBES: Right IJ CVP line placed 5/20  DISCUSSION: This is a 62 year old with a  history of MVR and presumed CAD as well as active smoking who was found with altered level of consciousness.  CT scan has shown a large left cerebellar infarct with pontine involvement.  ASSESSMENT / PLAN:  PULMONARY A: Mental is remarkably better today and I do not hesitate to extubate the patient based on his mental status.    CARDIOVASCULAR A: Echocardiogram is pending.  He continues his statin.  I would like to reintroduce his beta-blocker should his pulse rate rise.  Antiplatelet and anti-coagulant agents per neurology.  RENAL   GASTROINTESTINAL A: Prophylaxis is with Pepcid   HEMATOLOGIC A: SCDs alone for DVT prophylaxis due  to the large volume of his cerebellar infarct   INFECTIOUS A: Reviewed his chest x-ray this morning he does not appear to be evolving an infiltrate.  I am continuing his Zosyn today and will repeat a chest x-ray in the morning however he has no evidence of active infection based on his temperature and white count and I anticipate that his antibiotics will be stopped in the morning 5/23  ENDOCRINE A: Sliding scale insulin is in place  NEUROLOGIC A: Large volume cerebellar infarct with a concerning potential for deterioration.  He continues hypertonic saline for now, I have decreased his fentanyl to facilitate frequent neurological examination to determine whether or not surgical intervention is indicated.  Greater than 32 minutes was spent in the care of this patient today who is requiring frequent neurological monitoring and has a very high potential for life-threatening deterioration   Lars Masson, MD  [place] Pager: [phone]  09/06/2017, 10:51 AM

## 2017-09-07 ENCOUNTER — Inpatient Hospital Stay (HOSPITAL_COMMUNITY): Payer: Medicare HMO

## 2017-09-07 DIAGNOSIS — I6312 Cerebral infarction due to embolism of basilar artery: Secondary | ICD-10-CM

## 2017-09-07 DIAGNOSIS — E440 Moderate protein-calorie malnutrition: Secondary | ICD-10-CM

## 2017-09-07 LAB — MAGNESIUM
Magnesium: 2 mg/dL (ref 1.7–2.4)
Magnesium: 2 mg/dL (ref 1.7–2.4)

## 2017-09-07 LAB — SODIUM
SODIUM: 158 mmol/L — AB (ref 135–145)
Sodium: 153 mmol/L — ABNORMAL HIGH (ref 135–145)
Sodium: 153 mmol/L — ABNORMAL HIGH (ref 135–145)
Sodium: 153 mmol/L — ABNORMAL HIGH (ref 135–145)

## 2017-09-07 LAB — BLOOD GAS, ARTERIAL
ACID-BASE EXCESS: 3.6 mmol/L — AB (ref 0.0–2.0)
BICARBONATE: 27.4 mmol/L (ref 20.0–28.0)
Drawn by: 51155
FIO2: 30
LHR: 20 {breaths}/min
O2 SAT: 97.9 %
PATIENT TEMPERATURE: 98
PCO2 ART: 39.3 mmHg (ref 32.0–48.0)
PEEP: 5 cmH2O
VT: 490 mL
pH, Arterial: 7.456 — ABNORMAL HIGH (ref 7.350–7.450)
pO2, Arterial: 98.3 mmHg (ref 83.0–108.0)

## 2017-09-07 LAB — GLUCOSE, CAPILLARY
GLUCOSE-CAPILLARY: 113 mg/dL — AB (ref 65–99)
Glucose-Capillary: 109 mg/dL — ABNORMAL HIGH (ref 65–99)
Glucose-Capillary: 120 mg/dL — ABNORMAL HIGH (ref 65–99)
Glucose-Capillary: 120 mg/dL — ABNORMAL HIGH (ref 65–99)
Glucose-Capillary: 120 mg/dL — ABNORMAL HIGH (ref 65–99)
Glucose-Capillary: 127 mg/dL — ABNORMAL HIGH (ref 65–99)

## 2017-09-07 LAB — CBC WITH DIFFERENTIAL/PLATELET
BASOS ABS: 0.1 10*3/uL (ref 0.0–0.1)
Basophils Relative: 1 %
Eosinophils Absolute: 0.2 10*3/uL (ref 0.0–0.7)
Eosinophils Relative: 2 %
HCT: 41.7 % (ref 39.0–52.0)
HEMOGLOBIN: 13.2 g/dL (ref 13.0–17.0)
LYMPHS PCT: 18 %
Lymphs Abs: 1.9 10*3/uL (ref 0.7–4.0)
MCH: 29.5 pg (ref 26.0–34.0)
MCHC: 31.7 g/dL (ref 30.0–36.0)
MCV: 93.1 fL (ref 78.0–100.0)
Monocytes Absolute: 1.1 10*3/uL — ABNORMAL HIGH (ref 0.1–1.0)
Monocytes Relative: 10 %
NEUTROS PCT: 69 %
Neutro Abs: 7.4 10*3/uL (ref 1.7–7.7)
Platelets: 129 10*3/uL — ABNORMAL LOW (ref 150–400)
RBC: 4.48 MIL/uL (ref 4.22–5.81)
RDW: 15.2 % (ref 11.5–15.5)
WBC: 10.7 10*3/uL — AB (ref 4.0–10.5)

## 2017-09-07 LAB — CULTURE, RESPIRATORY W GRAM STAIN

## 2017-09-07 LAB — BASIC METABOLIC PANEL
ANION GAP: 8 (ref 5–15)
BUN: 10 mg/dL (ref 6–20)
CALCIUM: 9.1 mg/dL (ref 8.9–10.3)
CO2: 29 mmol/L (ref 22–32)
Chloride: 116 mmol/L — ABNORMAL HIGH (ref 101–111)
Creatinine, Ser: 1.06 mg/dL (ref 0.61–1.24)
Glucose, Bld: 130 mg/dL — ABNORMAL HIGH (ref 65–99)
POTASSIUM: 3.3 mmol/L — AB (ref 3.5–5.1)
Sodium: 153 mmol/L — ABNORMAL HIGH (ref 135–145)

## 2017-09-07 LAB — PHOSPHORUS
Phosphorus: 2.1 mg/dL — ABNORMAL LOW (ref 2.5–4.6)
Phosphorus: 3.2 mg/dL (ref 2.5–4.6)

## 2017-09-07 LAB — CULTURE, RESPIRATORY

## 2017-09-07 MED ORDER — SODIUM CHLORIDE 0.9 % IV SOLN
INTRAVENOUS | Status: DC
  Administered 2017-09-07: 1000 mL via INTRAVENOUS
  Administered 2017-09-09 – 2017-09-14 (×5): via INTRAVENOUS

## 2017-09-07 NOTE — Plan of Care (Signed)
Patient was extubated earlier today. Copious oral secretions. Patient is able to suction secretions using yankeur but needs assistance occasionally.

## 2017-09-07 NOTE — Progress Notes (Signed)
STROKE TEAM PROGRESS NOTE   SUBJECTIVE (INTERVAL HISTORY) His RN and Dr. Dominica Severin are at the bedside. On weaning trails, tolerating well. Plan to extubate today. Awake alert and following commands. Still has RUE flaccid and RLE weakness.  Will repeat CT in am to rule out hydrocephalus.    OBJECTIVE Temp:  [98 F (36.7 C)-99.1 F (37.3 C)] 99.1 F (37.3 C) (05/23 0800) Pulse Rate:  [50-99] 99 (05/23 1208) Cardiac Rhythm: Normal sinus rhythm (05/23 0400) Resp:  [15-25] 17 (05/23 1208) BP: (124-174)/(75-97) 124/78 (05/23 1100) SpO2:  [97 %-100 %] 97 % (05/23 1208) FiO2 (%):  [30 %] 30 % (05/23 0903) Weight:  [125 lb 10.6 oz (57 kg)] 125 lb 10.6 oz (57 kg) (05/23 0500)  Recent Labs  Lab 09/06/17 2007 09/06/17 2356 09/07/17 0408 09/07/17 0828 09/07/17 1238  GLUCAP 105* 109* 120* 127* 113*   Recent Labs  Lab 09/04/17 0524 09/04/17 0939  09/04/17 2118 09/05/17 0300  09/06/17 0301 09/06/17 0929 09/06/17 1717 09/06/17 2316 09/07/17 0516 09/07/17 1004  NA 143  --    < > 146* 148*   < > 160* 161* 161* 158* 153* 153*  K 3.4*  --   --  3.5 4.8  --  3.9  --   --   --  3.3*  --   CL 105  --   --  111 115*  --  127*  --   --   --  116*  --   CO2 24  --   --  24 26  --  24  --   --   --  29  --   GLUCOSE 104*  --   --  122* 113*  --  106*  --   --   --  130*  --   BUN 24*  --   --  20 17  --  13  --   --   --  10  --   CREATININE 1.28*  --   --  1.26* 1.40*  --  1.26*  --   --   --  1.06  --   CALCIUM 9.4  --   --  8.8* 8.4*  --  8.5*  --   --   --  9.1  --   MG  --  1.6*  --  2.6* 2.6*  --   --   --   --   --  2.0  --   PHOS 4.3  --   --  3.3 2.9  --   --   --   --   --  2.1*  --    < > = values in this interval not displayed.   Recent Labs  Lab 09/04/17 0524 09/04/17 2118  AST 36 44*  ALT 24 24  ALKPHOS 60 59  BILITOT 1.3* 1.1  PROT 6.9 5.8*  ALBUMIN 4.1 3.5   Recent Labs  Lab 09/04/17 0524 09/04/17 2118 09/05/17 0300 09/06/17 0301 09/07/17 0516  WBC 13.7* 14.6*  13.0* 10.7* 10.7*  NEUTROABS 11.0* 11.5*  --   --  7.4  HGB 16.9 15.5 14.5 13.4 13.2  HCT 50.2 46.5 44.2 42.4 41.7  MCV 89.2 88.7 89.3 93.6 93.1  PLT 162 156 139* 129* 129*   Recent Labs  Lab 09/04/17 0524 09/04/17 2118  TROPONINI <0.03 <0.03   No results for input(s): LABPROT, INR in the last 72 hours. No results for input(s): COLORURINE, LABSPEC, [place], [place], [place], [place],  KETONESUR, PROTEINUR, UROBILINOGEN, NITRITE, LEUKOCYTESUR in the last 72 hours.  Invalid input(s): APPERANCEUR     Component Value Date/Time   CHOL 79 09/05/2017 0300   TRIG 102 09/05/2017 0300   HDL 42 09/05/2017 0300   CHOLHDL 1.9 09/05/2017 0300   VLDL 20 09/05/2017 0300   LDLCALC 17 09/05/2017 0300   Lab Results  Component Value Date   HGBA1C 5.5 09/05/2017      Component Value Date/Time   LABOPIA NONE DETECTED 09/04/2017 2314   COCAINSCRNUR NONE DETECTED 09/04/2017 2314   COCAINSCRNUR NONE DETECTED 09/04/2017 0602   LABBENZ POSITIVE (A) 09/04/2017 2314   AMPHETMU NONE DETECTED 09/04/2017 2314   THCU NONE DETECTED 09/04/2017 2314   LABBARB NONE DETECTED 09/04/2017 2314    Recent Labs  Lab 09/04/17 0524  ETH <10    I have personally reviewed the radiological images below and agree with the radiology interpretations.  Ct Head Wo Contrast  Result Date: 09/05/2017 CLINICAL DATA:  Follow-up examination for acute stroke. EXAM: CT HEAD WITHOUT CONTRAST TECHNIQUE: Contiguous axial images were obtained from the base of the skull through the vertex without intravenous contrast. COMPARISON:  Prior CT and MRI from 09/04/2017. FINDINGS: Brain: Continued interval evolution of left greater than right acute ischemic nonhemorrhagic bilateral cerebellar infarcts, left greater than right, with large confluent left SCA territory infarct. Associated left pontine infarct noted as well, better seen on recent MRI. Overall, size and distribution relatively similar to previous. Mildly increased localized  edema with increased partial effacement of the left perimesencephalic cistern. Fourth ventricle remains widely patent. No hydrocephalus. No evidence for hemorrhagic transformation. Otherwise stable appearance of the brain. No other acute intracranial infarct or hemorrhage. No mass lesion or midline shift. No hydrocephalus. No extra-axial fluid collection. Vascular: No hyperdense vessel. Skull: Scalp soft tissues and calvarium within normal limits. Sinuses/Orbits: Globes and orbital soft tissues demonstrate no acute finding. Mucosal thickening throughout the ethmoidal air cells and maxillary sinuses with air-fluid level within the right maxillary sinus. Mastoid air cells remain clear. Other: None. IMPRESSION: 1. Continued interval evolution of acute ischemic left greater than right pontocerebellar infarcts, with slightly increased localized edema as compared to previous. Adjacent fourth ventricle remains patent without hydrocephalus. No evidence for hemorrhagic transformation or other complication. 2. No other new acute intracranial abnormality. Electronically Signed   By: Jeannine Boga M.D.   On: 09/05/2017 01:18   Ct Head Wo Contrast  Result Date: 09/04/2017 CLINICAL DATA:  Initial evaluation for acute unresponsiveness. EXAM: CT HEAD WITHOUT CONTRAST TECHNIQUE: Contiguous axial images were obtained from the base of the skull through the vertex without intravenous contrast. COMPARISON:  None. FINDINGS: Brain: Generalized age-related cerebral atrophy. Mild chronic small vessel ischemic change present within the hemispheric cerebral white matter. Confluent hypodensity involving the superior left cerebellar hemisphere, consistent with evolving acute ischemic left superior cerebellar artery territory infarct. No associated hemorrhage. No significant mass effect at this time. Adjacent fourth ventricle remains patent. No inferior herniation. No other evidence for acute large vessel territory infarct. No  intracranial hemorrhage. No mass lesion or midline shift. No hydrocephalus. No extra-axial fluid collection. Vascular: No hyperdense vessel. Scattered vascular calcifications noted within the carotid siphons. Skull: Scalp soft tissues and calvarium within normal limits. Sinuses/Orbits: Globes and orbital soft tissues within normal limits. Small air-fluid level noted within the right maxillary sinus. Scattered mucosal thickening within the ethmoidal air cells. Visualized mastoids are clear. Other: None. IMPRESSION: 1. Evolving acute ischemic infarct involving the superior left cerebellar hemisphere, left  superior cerebellar artery territory. No associated hemorrhage or significant mass effect at this time. 2. Age-related cerebral atrophy with mild chronic small vessel ischemic disease. Critical Value/emergent results were called by telephone at the time of interpretation on 09/04/2017 at 5:56 am to Dr. Lurline Hare , who verbally acknowledged these results. Electronically Signed   By: Jeannine Boga M.D.   On: 09/04/2017 05:56   Mr Brain Wo Contrast  Result Date: 09/04/2017 CLINICAL DATA:  Follow-up LEFT cerebellar infarct. Found unresponsive in car at [employer]. Critically ill. History of hypertension. EXAM: MRI HEAD WITHOUT CONTRAST MRA HEAD WITHOUT CONTRAST TECHNIQUE: Multiplanar, multiecho pulse sequences of the brain and surrounding structures were obtained without intravenous contrast. Angiographic images of the head were obtained using MRA technique without contrast. COMPARISON:  CT HEAD Sep 04, 2017 at 0527 hours FINDINGS: Multiple sequences are moderately motion degraded. MRI HEAD FINDINGS INTRACRANIAL CONTENTS: Confluent reduced diffusion LEFT pons, LEFT cerebellum predominately involving the superior portion. Patchy reduced diffusion medial mesial RIGHT cerebellum. All areas of reduced diffusion demonstrate low ADC values and bright FLAIR signal. No susceptibility artifact to suggest hemorrhage.  Fourth ventricle is open though, there is mild upward herniation and narrowed LEFT quadrigeminal cistern. No parenchymal brain volume loss for age. No supratentorial midline shift. No masses. No abnormal extra-axial fluid collections. VASCULAR: T2 bright signal LEFT C1 foramen transversarium corresponding to LEFT vertebral artery. Otherwise normal major intracranial vascular flow voids present at skull base. SKULL AND UPPER CERVICAL SPINE: No abnormal sellar expansion. No suspicious calvarial bone marrow signal. Posterior cervical spine susceptibility artifact likely reflects hardware. Craniocervical junction maintained. SINUSES/ORBITS: Mild paranasal sinus mucosal thickening. Air-fluid level in the pharynx due to life-support lines. Mastoid air cells are well aerated.The included ocular globes and orbital contents are non-suspicious. OTHER: Life-support lines in place. MRA HEAD FINDINGS ANTERIOR CIRCULATION: Normal flow related enhancement of the included cervical, petrous, cavernous and supraclinoid internal carotid arteries. Patent anterior communicating artery. Patent anterior and middle cerebral arteries, mild luminal irregularity seen with atherosclerosis or motion artifact. No large vessel occlusion, flow limiting stenosis though limited by motion, aneurysm. POSTERIOR CIRCULATION: RIGHT vertebral artery is dominant. For flow related enhancement LEFT vertebral artery with retrograde flow at vertebrobasilar junction. Patent RIGHT vertebral artery mild luminal irregularity seen with motion artifact or atherosclerosis. No flow limiting stenosis though limited by motion, aneurysm. ANATOMIC VARIANTS: None. Source images and MIP images were reviewed. IMPRESSION: MRI HEAD: 1. Motion degraded examination. 2. 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 Regional mass effect without obstructive hydrocephalus. MRA HEAD: 1. Slow flow versus occluded LEFT vertebral  artery. 2. No flow limiting stenosis on this motion degraded examination. Electronically Signed   By: Elon Alas M.D.   On: 09/04/2017 15:33   Mr Jodene Nam Head/brain ZO Cm  Result Date: 09/04/2017 CLINICAL DATA:  Follow-up LEFT cerebellar infarct. Found unresponsive in car at [employer]. Critically ill. History of hypertension. EXAM: MRI HEAD WITHOUT CONTRAST MRA HEAD WITHOUT CONTRAST TECHNIQUE: Multiplanar, multiecho pulse sequences of the brain and surrounding structures were obtained without intravenous contrast. Angiographic images of the head were obtained using MRA technique without contrast. COMPARISON:  CT HEAD Sep 04, 2017 at 0527 hours FINDINGS: Multiple sequences are moderately motion degraded. MRI HEAD FINDINGS INTRACRANIAL CONTENTS: Confluent reduced diffusion LEFT pons, LEFT cerebellum predominately involving the superior portion. Patchy reduced diffusion medial mesial RIGHT cerebellum. All areas of reduced diffusion demonstrate low ADC values and bright FLAIR signal. No susceptibility artifact to suggest hemorrhage. Fourth  ventricle is open though, there is mild upward herniation and narrowed LEFT quadrigeminal cistern. No parenchymal brain volume loss for age. No supratentorial midline shift. No masses. No abnormal extra-axial fluid collections. VASCULAR: T2 bright signal LEFT C1 foramen transversarium corresponding to LEFT vertebral artery. Otherwise normal major intracranial vascular flow voids present at skull base. SKULL AND UPPER CERVICAL SPINE: No abnormal sellar expansion. No suspicious calvarial bone marrow signal. Posterior cervical spine susceptibility artifact likely reflects hardware. Craniocervical junction maintained. SINUSES/ORBITS: Mild paranasal sinus mucosal thickening. Air-fluid level in the pharynx due to life-support lines. Mastoid air cells are well aerated.The included ocular globes and orbital contents are non-suspicious. OTHER: Life-support lines in place. MRA HEAD  FINDINGS ANTERIOR CIRCULATION: Normal flow related enhancement of the included cervical, petrous, cavernous and supraclinoid internal carotid arteries. Patent anterior communicating artery. Patent anterior and middle cerebral arteries, mild luminal irregularity seen with atherosclerosis or motion artifact. No large vessel occlusion, flow limiting stenosis though limited by motion, aneurysm. POSTERIOR CIRCULATION: RIGHT vertebral artery is dominant. For flow related enhancement LEFT vertebral artery with retrograde flow at vertebrobasilar junction. Patent RIGHT vertebral artery mild luminal irregularity seen with motion artifact or atherosclerosis. No flow limiting stenosis though limited by motion, aneurysm. ANATOMIC VARIANTS: None. Source images and MIP images were reviewed. IMPRESSION: MRI HEAD: 1. Motion degraded examination. 2. 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 Regional mass effect without obstructive hydrocephalus. MRA HEAD: 1. Slow flow versus occluded LEFT vertebral artery. 2. No flow limiting stenosis on this motion degraded examination. Electronically Signed   By: Elon Alas M.D.   On: 09/04/2017 15:33   Ct Head Wo Contrast  Result Date: 09/06/2017 CLINICAL DATA:  Follow-up stroke. History of hypertension, hyperlipidemia, LEFT vertebral artery occlusion. EXAM: CT HEAD WITHOUT CONTRAST TECHNIQUE: Contiguous axial images were obtained from the base of the skull through the vertex without intravenous contrast. COMPARISON:  CT HEAD Sep 05, 2017. FINDINGS: BRAIN: Evolving acute large LEFT and small RIGHT cerebellar and LEFT pontine nonhemorrhagic infarcts. Mild mass effect on the fourth ventricle and cerebral aqua duct which remain patent. No hydrocephalus. No intraparenchymal hemorrhage. Old small RIGHT frontal lobe better seen today. No parenchymal brain volume loss for age. No supratentorial midline shift or mass effect. No abnormal  extra-axial fluid collections. Basal cisterns are patent. VASCULAR: Unremarkable. SKULL: No skull fracture. No significant scalp soft tissue swelling. SINUSES/ORBITS: Mild paranasal sinus mucosal thickening. Mastoid air cells are well aerated.The included ocular globes and orbital contents are non-suspicious. OTHER: None. IMPRESSION: 1. Evolving acute LEFT greater than RIGHT cerebellar and LEFT pontine nonhemorrhagic infarcts. Similar cytotoxic edema with mild mass effect on fourth ventricle. No hydrocephalus. Electronically Signed   By: Elon Alas M.D.   On: 09/06/2017 05:20   Ct Angio Neck W Or Wo Contrast  Result Date: 09/05/2017 CLINICAL DATA:  62 y/o  M; follow-up of stroke. EXAM: CT ANGIOGRAPHY NECK TECHNIQUE: Multidetector CT imaging of the neck was performed using the standard protocol during bolus administration of intravenous contrast. Multiplanar CT image reconstructions and MIPs were obtained to evaluate the vascular anatomy. Carotid stenosis measurements (when applicable) are obtained utilizing NASCET criteria, using the distal internal carotid diameter as the denominator. CONTRAST:  45mL ISOVUE-370 IOPAMIDOL (ISOVUE-370) INJECTION 76% COMPARISON:  09/04/2017 MRI and MRA of the head. 09/05/2017 CT of the head. FINDINGS: Aortic arch: Bovine variant branching. Imaged portion shows no evidence of aneurysm or dissection. No significant stenosis of the major arch vessel origins. Mild calcific atherosclerosis.  Right carotid system: No evidence of dissection, stenosis (50% or greater) or occlusion. Left carotid system: No evidence of dissection, stenosis (50% or greater) or occlusion. Vertebral arteries: Left vertebral artery origin occlusion/near occlusion, short segment of patency of V1, occlusion of V2 to the C2 level, patent vertebral artery from the C2 level to the vertebrobasilar junction. Poor opacification of the distal V2, V3, and V4 segments, probably due to retrograde flow. No evidence of  dissection, stenosis (50% or greater) or occlusion of right vertebral artery. Skeleton: C2-C5 posterior instrumented fusion and C3-4 interbody fusion with laminectomy. C7-T1 grade 1 anterolisthesis with prominent facet arthropathy. Other neck: Right central venous catheter tip extends into the SVC below the field of view. Enteric tube within the esophagus extending below the field of view. Endotracheal tube tip 4 cm above carina. Upper chest: Negative. IMPRESSION: 1. 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 2. Separate segment of left vertebral artery origin occlusion/near occlusion. 3. Patent distal left V2, V3, and V4 with hypoenhancement, probably representing retrograde flow. 4. Patent right vertebral and bilateral carotid systems without significant stenosis by NASCET criteria. Electronically Signed   By: Kristine Garbe M.D.   On: 09/05/2017 18:16   TTE - Left ventricle: The cavity size was normal. Wall thickness was   normal. Systolic function was vigorous. The estimated ejection   fraction was in the range of 65% to 70%. Wall motion was normal;   there were no regional wall motion abnormalities. Doppler   parameters are consistent with abnormal left ventricular   relaxation (grade 1 diastolic dysfunction). - Mitral valve: A bioprosthesis was present and functioning   normally. Mean gradient (D): 3 mm Hg. Valve area by continuity   equation (using LVOT flow): 1.88 cm^2. Impressions: - No cardiac source of emboli was indentified.  LE venous doppler  Right: There is no evidence of deep vein thrombosis in the lower extremity.There is no evidence of superficial venous thrombosis. No cystic structure found in the popliteal fossa. Left: There is no evidence of deep vein thrombosis in the lower extremity.There is no evidence of superficial venous thrombosis. No cystic  structure found in the popliteal fossa.  EEG The EEG is normal with patient recording awake and drowsy state only.  CT pending   PHYSICAL EXAM  Temp:  [98 F (36.7 C)-99.1 F (37.3 C)] 99.1 F (37.3 C) (05/23 0800) Pulse Rate:  [50-99] 99 (05/23 1208) Resp:  [15-25] 17 (05/23 1208) BP: (124-174)/(75-97) 124/78 (05/23 1100) SpO2:  [97 %-100 %] 97 % (05/23 1208) FiO2 (%):  [30 %] 30 % (05/23 0903) Weight:  [125 lb 10.6 oz (57 kg)] 125 lb 10.6 oz (57 kg) (05/23 0500)  General - Well nourished, well developed, intubated.  Ophthalmologic - fundi not visualized due to noncooperation.  Cardiovascular - Regular rate and rhythm.  Neuro - awake, alert, intubated not off sedation. Follows all simple commands. PERRL, both eyes left gaze palsy, b/l eye right, up and downward gaze with nystagmus. Blinking to visual threat bilaterally. Right facial droop. Tongue midline in mouth. LUE 4/5, following all commands. LLE 3/5 at least. No babinski. RUE 0/5 and RLE 3-/5, with positive babinski. DTR 1+. Sensation, coordination not cooperative and gait not tested.   ASSESSMENT/PLAN [name] is a 62 y.o. male with history of HTN and HLD admitted for unresponsive in car with questionable shaking episode, concerning for  seizure. Also found to have right facial droop and was intubated for airway protection. No tPA given due to OSW.   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 cardioembolic  Resultant intubated, eye movement difficulty, right facial droop, right hemiplegia  MRI left SCA and left pontine large infarcts as well as punctate right PICA infarct  MRA  Left VA slow flow vs. Occlusion  CTA neck left V4 and VA origin occlusion  CT repeat in am  2D Echo EF 65-70%  LE venous doppler no DVT  LDL 17  HgbA1c 5.5  Consider TEE and loop once medically more stabilized  Heparin  subq for VTE prophylaxis  aspirin 81 mg daily prior to admission, now on aspirin 325 mg daily and plavix daily. Continue DAPT for 3 months and then plavix alone  Ongoing aggressive stroke risk factor management  Therapy recommendations:  pending  Disposition:  Pending  Cerebellar edema  MRI showed large left cerebellar infarct  Repeat CT stable no hydrocephalus  Off 3% saline due to high Na -> 1/2NS -> NS  Na 144->158->161->153  Na Q6h, goal 150-155  CT repeat in am  Respiratory distress  Intubated  CCM on board  Plan to extubate today  Seizure-like activity  Doubt the "shaking" saw by EMS was seizure  Could be due to limb posturing due to brain stem infarct  EEG normal   No need AED this time  Continue to monitor   Hypertension Stable Permissive hypertension (OK if <220/120) for 24-48 hours post stroke and then gradually normalized within 5-7 days.  Long term BP goal normotensive  Hyperlipidemia  Home meds:  lipitor 40   LDL 17, goal < 70  Now lipitor down to 10 mg  Continue lipitor 10 on discharge  Other Stroke Risk Factors  Advanced age  Other Active Problems  Leukocytosis WBC 14.6->13.0->10.7->10.7  AKI, resolved - Cre 1.40->1.26->1.06  Hospital day # 3  This patient is critically ill due to large posterior circulation stroke, respiratory failure, cerebral edema, seizure activity and at significant risk of neurological worsening, death form hydrocephalus, recurrent stroke, hemorrhagic conversion, status epilepticus. This patient's care requires constant monitoring of vital signs, hemodynamics, respiratory and cardiac monitoring, review of multiple databases, neurological assessment, discussion with family, other specialists and medical decision making of high complexity. I spent 35 minutes of neurocritical care time in the care of this patient.   Rosalin Hawking, MD PhD Stroke Neurology 09/07/2017 1:13 PM    To contact Stroke Continuity  provider, please refer to [email or web address] After hours, contact General Neurology

## 2017-09-07 NOTE — Procedures (Signed)
Extubation Procedure Note  Patient Details:   Name: Dillon Garcia DOB: 29-Mar-1956 MRN: [id number]   Airway Documentation:  Airway 7.5 mm (Active)  Secured at (cm) 25 cm 09/07/2017  9:03 AM  Measured From Lips 09/07/2017  9:03 AM  Secured Location Left 09/07/2017  9:03 AM  Secured By [employer] 09/07/2017  9:03 AM  Tube Holder Repositioned Yes 09/07/2017  9:03 AM  Cuff Pressure (cm H2O) 26 cm H2O 09/07/2017  9:03 AM  Site Condition Dry 09/07/2017  3:58 AM   Vent end date: (not recorded) Vent end time: (not recorded)   Evaluation  O2 sats: stable throughout Complications: No apparent complications Patient did tolerate procedure well. Bilateral Breath Sounds: Clear, Diminished   Yes   Extubated patient with copious secretions patient awake at this time.  Rudene Re 09/07/2017, 12:07 PM

## 2017-09-07 NOTE — Progress Notes (Signed)
PULMONARY / CRITICAL CARE MEDICINE   Name: Dillon Garcia MRN: [id number] DOB: [birth year]    ADMISSION DATE:  09/04/2017  CHIEF COMPLAINT:  Found unresponsive  HISTORY OF PRESENT ILLNESS:        62yoM with hx HTN, HLD, CAD, GERD, and Active tobacco abuse, presents with an acute cerebellar CVA. Reportedly he was found unresponsive in his car outside [place] with questionable seizure activity. He was taken initially to [place] where he was seen to have right right facial droop, and Head CT showed an acute left cerebellar infarct. He was initially following simple commands, but his mental status then began to decline. He received a bolus of mannitol on arrival here and was started on 3% saline which has subsequently ben discontinued.      He remains intubated and mechanically ventilated this morning he is very alert and nodding to questions.  I switched him to CPAP with a pressure support of 7 at the beginning of my examination and he continues to deny dyspnea while maintaining good respiratory mechanics.  PAST MEDICAL HISTORY :  He  has a past medical history of GERD (gastroesophageal reflux disease), Heart disease, HTN (hypertension), Hyperlipidemia, Hypothyroid, Infectious endocarditis (2012), and Mitral valve disease (2012).  PAST SURGICAL HISTORY: He  has a past surgical history that includes Median sternotomy and Mitral valve replacement (2012).  No Known Allergies  No current facility-administered medications on file prior to encounter.    Current Outpatient Medications on File Prior to Encounter  Medication Sig  . amLODipine (NORVASC) 5 MG tablet Take 10 mg by mouth daily.   Marland Kitchen aspirin EC 81 MG tablet Take 81 mg by mouth daily.  . famotidine (PEPCID) 10 MG tablet Take 10 mg by mouth at bedtime as needed for heartburn or indigestion.  Marland Kitchen lisinopril-hydrochlorothiazide (PRINZIDE,ZESTORETIC) 20-12.5 MG tablet Take 2 tablets by mouth daily.   . metoprolol succinate (TOPROL-XL) 100  MG 24 hr tablet Take 100 mg by mouth daily. Take with or immediately following a meal.    FAMILY HISTORY:  His has no family status information on file.    SOCIAL HISTORY: He  reports that he has been smoking.  He uses smokeless tobacco.  REVIEW OF SYSTEMS:   Unobtainable  SUBJECTIVE:  Unobtainable  VITAL SIGNS: BP 124/78   Pulse 79   Temp 99.1 F (37.3 C) (Oral)   Resp 20   Ht 5\' 8"  (1.727 m) Comment: per pt report  Wt 125 lb 10.6 oz (57 kg)   SpO2 98%   BMI 19.11 kg/m   HEMODYNAMICS:    VENTILATOR SETTINGS: Vent Mode: PRVC FiO2 (%):  [30 %] 30 % Set Rate:  [20 bmp] 20 bmp Vt Set:  [490 mL] 490 mL PEEP:  [5 cmH20] 5 cmH20 Plateau Pressure:  [15 cmH20-22 cmH20] 22 cmH20  INTAKE / OUTPUT: I/O last 3 completed shifts: In: 1959.1 [I.V.:1110.1; NG/GT:849] Out: 2475 [Urine:2325; Emesis/NG output:150]  PHYSICAL EXAMINATION: General: Orally intubated and mechanically ventilated in no distress on CPAP with pressure support of 7   Neuro: He is nodding appropriately to questions for me.  The face is symmetric, EOMs appear to be full without exaggerated nystagmus and pupils are equal.  He is able to move the left extremities but not the right.   Cardiovascular: S1 and S2 are regular without murmur rub or gallop.  There is no JVD.      Lungs: He has a class I airway.  Respirations are unlabored on a pressure support  of 7.  There is symmetric air movement, no wheezes. the lungs are clear    Abdomen: The abdomen is flat and soft without any overt organomegaly masses tenderness guarding or rebound   LABS:  BMET Recent Labs  Lab 09/05/17 0300  09/06/17 0301  09/06/17 2316 09/07/17 0516 09/07/17 1004  NA 148*   < > 160*   < > 158* 153* 153*  K 4.8  --  3.9  --   --  3.3*  --   CL 115*  --  127*  --   --  116*  --   CO2 26  --  24  --   --  29  --   BUN 17  --  13  --   --  10  --   CREATININE 1.40*  --  1.26*  --   --  1.06  --   GLUCOSE 113*  --  106*  --   --   130*  --    < > = values in this interval not displayed.    Electrolytes Recent Labs  Lab 09/04/17 2118 09/05/17 0300 09/06/17 0301 09/07/17 0516  CALCIUM 8.8* 8.4* 8.5* 9.1  MG 2.6* 2.6*  --  2.0  PHOS 3.3 2.9  --  2.1*    CBC Recent Labs  Lab 09/05/17 0300 09/06/17 0301 09/07/17 0516  WBC 13.0* 10.7* 10.7*  HGB 14.5 13.4 13.2  HCT 44.2 42.4 41.7  PLT 139* 129* 129*    Coag's Recent Labs  Lab 09/04/17 0524  APTT 24  INR 0.94    Sepsis Markers Recent Labs  Lab 09/04/17 0602 09/04/17 0935 09/04/17 2118 09/04/17 2314  LATICACIDVEN 1.5 1.5  --  1.0  PROCALCITON  --   --  <0.10  --     ABG Recent Labs  Lab 09/04/17 2247 09/05/17 0300 09/07/17 0400  PHART 7.389 7.418 7.456*  PCO2ART 40.7 39.0 39.3  PO2ART 108.0 104 98.3    Liver Enzymes Recent Labs  Lab 09/04/17 0524 09/04/17 2118  AST 36 44*  ALT 24 24  ALKPHOS 60 59  BILITOT 1.3* 1.1  ALBUMIN 4.1 3.5    Cardiac Enzymes Recent Labs  Lab 09/04/17 0524 09/04/17 2118  TROPONINI <0.03 <0.03    Glucose Recent Labs  Lab 09/06/17 1232 09/06/17 1621 09/06/17 2007 09/06/17 2356 09/07/17 0408 09/07/17 0828  GLUCAP 117* 82 105* 109* 120* 127*    Imaging Dg Chest Port 1 View  Result Date: 09/07/2017 CLINICAL DATA:  Hypoxia EXAM: PORTABLE CHEST 1 VIEW COMPARISON:  Sep 06, 2017 FINDINGS: Endotracheal tube tip is 4.1 cm above the carina. Central catheter tip is in the superior vena cava. Nasogastric tube tip and side port are in the stomach. No adenopathy. There is no evident edema or consolidation. The heart size and pulmonary vascularity are normal. No adenopathy. There is aortic atherosclerosis. No evident bone lesions. IMPRESSION: Tube and catheter positions as described without pneumothorax. No edema or consolidation. There is aortic atherosclerosis. Aortic Atherosclerosis (ICD10-I70.0). Electronically Signed   By: Lowella Grip III M.D.   On: 09/07/2017 09:18   Dg Abd Portable  1v  Result Date: 09/06/2017 CLINICAL DATA:  OG tube placement EXAM: PORTABLE ABDOMEN - 1 VIEW COMPARISON:  09/06/2017 FINDINGS: OG tube is in the stomach with the tip in the fundus of the stomach. Nonobstructive bowel gas pattern. IMPRESSION: OG tube tip in the fundus of the stomach. Electronically Signed   By: Rolm Baptise M.D.  On: 09/06/2017 17:32     STUDIES: Chest x-ray shows a well-placed endotracheal tube and no infiltrate CULTURES: Sputum of 5/21 is growing moderate Haemophilus which is a beta-lactamase producer ANTIBIOTICS: Zosyn started 5/20 for possible aspiration  SIGNIFICANT EVENTS:   LINES/TUBES: Right IJ CVP line placed 5/20  DISCUSSION: This is a 62 year old with a history of MVR and presumed CAD as well as active smoking who was found with altered level of consciousness.  CT scan has shown a large left cerebellar infarct with pontine involvement.  ASSESSMENT / PLAN:  PULMONARY A: His mental status is good today and I have no concerns about him maintaining his airway from mental status standpoint.  There was some question of cord edema when he was reintubated and I will keep this in mind following extubation.  I am anticipating that he will be extubated in the next several minutes as he is tolerating a spontaneous breathing trial     CARDIOVASCULAR A: Echocardiogram is pending.  He continues his statin.  I would like to reintroduce his beta-blocker should his pulse rate rise.  Antiplatelet and anti-coagulant agents per neurology.  RENAL   GASTROINTESTINAL A: Prophylaxis is with Pepcid   HEMATOLOGIC A: SCDs alone for DVT prophylaxis due to the large volume of his cerebellar infarct   INFECTIOUS A: He has not evolved an infiltrate and his Zosyn has been discontinued.    ENDOCRINE A: Sliding scale insulin is in place  NEUROLOGIC A: Large volume cerebellar infarct.  Despite this his neurological examination continues to improve.   Lars Masson, MD  Critical  Care Medicine [facility] Pager: [phone]  09/07/2017, 11:51 AM

## 2017-09-08 ENCOUNTER — Inpatient Hospital Stay (HOSPITAL_COMMUNITY): Payer: Medicare HMO

## 2017-09-08 DIAGNOSIS — R001 Bradycardia, unspecified: Secondary | ICD-10-CM

## 2017-09-08 DIAGNOSIS — I10 Essential (primary) hypertension: Secondary | ICD-10-CM

## 2017-09-08 DIAGNOSIS — T17908A Unspecified foreign body in respiratory tract, part unspecified causing other injury, initial encounter: Secondary | ICD-10-CM

## 2017-09-08 DIAGNOSIS — Z4659 Encounter for fitting and adjustment of other gastrointestinal appliance and device: Secondary | ICD-10-CM

## 2017-09-08 DIAGNOSIS — R0682 Tachypnea, not elsewhere classified: Secondary | ICD-10-CM

## 2017-09-08 DIAGNOSIS — R569 Unspecified convulsions: Secondary | ICD-10-CM

## 2017-09-08 DIAGNOSIS — Z720 Tobacco use: Secondary | ICD-10-CM

## 2017-09-08 DIAGNOSIS — E870 Hyperosmolality and hypernatremia: Secondary | ICD-10-CM

## 2017-09-08 DIAGNOSIS — I5189 Other ill-defined heart diseases: Secondary | ICD-10-CM

## 2017-09-08 LAB — BASIC METABOLIC PANEL
ANION GAP: 10 (ref 5–15)
BUN: 8 mg/dL (ref 6–20)
CO2: 29 mmol/L (ref 22–32)
CREATININE: 1.02 mg/dL (ref 0.61–1.24)
Calcium: 9.4 mg/dL (ref 8.9–10.3)
Chloride: 114 mmol/L — ABNORMAL HIGH (ref 101–111)
GFR calc non Af Amer: >60 mL/min (ref >60)
Glucose, Bld: 95 mg/dL (ref 65–99)
Potassium: 3.5 mmol/L (ref 3.5–5.1)
Sodium: 153 mmol/L — ABNORMAL HIGH (ref 135–145)

## 2017-09-08 LAB — SODIUM
Sodium: 151 mmol/L — ABNORMAL HIGH (ref 135–145)
Sodium: 150 mmol/L — ABNORMAL HIGH (ref 135–145)

## 2017-09-08 LAB — MAGNESIUM
Magnesium: 1.7 mg/dL (ref 1.7–2.4)
Magnesium: 1.6 mg/dL — ABNORMAL LOW (ref 1.7–2.4)

## 2017-09-08 LAB — GLUCOSE, CAPILLARY
GLUCOSE-CAPILLARY: 75 mg/dL (ref 65–99)
GLUCOSE-CAPILLARY: 99 mg/dL (ref 65–99)
GLUCOSE-CAPILLARY: 153 mg/dL — AB (ref 65–99)
Glucose-Capillary: 81 mg/dL (ref 65–99)
Glucose-Capillary: 86 mg/dL (ref 65–99)
Glucose-Capillary: 92 mg/dL (ref 65–99)
Glucose-Capillary: 96 mg/dL (ref 65–99)

## 2017-09-08 LAB — CBC
HEMATOCRIT: 46.2 % (ref 39.0–52.0)
Hemoglobin: 14.6 g/dL (ref 13.0–17.0)
MCH: 29.4 pg (ref 26.0–34.0)
MCHC: 31.6 g/dL (ref 30.0–36.0)
MCV: 93.1 fL (ref 78.0–100.0)
PLATELETS: 151 10*3/uL (ref 150–400)
RBC: 4.96 MIL/uL (ref 4.22–5.81)
RDW: 14.6 % (ref 11.5–15.5)
WBC: 10.4 10*3/uL (ref 4.0–10.5)

## 2017-09-08 LAB — PHOSPHORUS
PHOSPHORUS: 4.1 mg/dL (ref 2.5–4.6)
Phosphorus: 4 mg/dL (ref 2.5–4.6)

## 2017-09-08 MED ORDER — ATORVASTATIN CALCIUM 40 MG PO TABS
40.0000 mg | ORAL_TABLET | Freq: Every day | ORAL | Status: DC
  Administered 2017-09-08 – 2017-09-14 (×7): 40 mg via ORAL
  Filled 2017-09-08 (×7): qty 1

## 2017-09-08 MED ORDER — PRO-STAT SUGAR FREE PO LIQD
30.0000 mL | Freq: Every day | ORAL | Status: DC
  Administered 2017-09-08 – 2017-09-13 (×6): 30 mL
  Filled 2017-09-08 (×7): qty 30

## 2017-09-08 MED ORDER — JEVITY 1.2 CAL PO LIQD
1000.0000 mL | ORAL | Status: DC
  Administered 2017-09-08: 60 mL/h
  Administered 2017-09-09 – 2017-09-11 (×4): 1000 mL
  Filled 2017-09-08 (×11): qty 1000

## 2017-09-08 MED ORDER — CHLORHEXIDINE GLUCONATE 0.12 % MT SOLN
OROMUCOSAL | Status: AC
  Filled 2017-09-08: qty 15

## 2017-09-08 MED ORDER — CHLORHEXIDINE GLUCONATE CLOTH 2 % EX PADS
6.0000 | MEDICATED_PAD | Freq: Every day | CUTANEOUS | Status: DC
  Administered 2017-09-09 – 2017-09-10 (×4): 6 via TOPICAL

## 2017-09-08 NOTE — Evaluation (Signed)
Clinical/Bedside Swallow Evaluation Patient Details  Name: Dillon Garcia MRN: [id number] Date of Birth: 05/07/1955  Today's Date: 09/08/2017 Time: SLP Start Time (ACUTE ONLY): 1432 SLP Stop Time (ACUTE ONLY): 1445 SLP Time Calculation (min) (ACUTE ONLY): 13 min  Past Medical History:  Past Medical History:  Diagnosis Date  . GERD (gastroesophageal reflux disease)   . Heart disease   . HTN (hypertension)   . Hyperlipidemia   . Hypothyroid   . Infectious endocarditis 2012  . Mitral valve disease 2012   Past Surgical History:  Past Surgical History:  Procedure Laterality Date  . MEDIAN STERNOTOMY     CABG? valve replacement?  Marland Kitchen MITRAL VALVE REPLACEMENT  2012   HPI:  83 yoM with hx HTN, HLD, CAD, GERD, and active tobacco abuse, presents with an acute CVA.  MRI of brain showed acute bilateral (larger left, smaller right) cerebellar nonhemorrhagic infarcts, left pontine infarct. Intubated 5/20-5/23.   Assessment / Plan / Recommendation Clinical Impression  Pt presents with s/s of a neurogenic dysphagia, with significant CN involvement right V, VII, X, XII.  Voice is aphonic; cough is weak.  Pt producing copious secretions, with anterior loss right side of mouth and frequent coughing.  Trials of ice chips and water elicit a swallow response followed by delayed, weak coughing, concerning for aspiration.  Given multiple risk factors for dysphagia with aspiration, recommend continuing NPO for now; consider temporary enteral feeding.  Discussed with RN, pt.  SLP will follow for therapeutic exercises and readiness for instrumental swallow study.  SLP Visit Diagnosis: Dysphagia, oropharyngeal phase (R13.12)    Aspiration Risk       Diet Recommendation   NPO       Other  Recommendations Oral Care Recommendations: Oral care QID   Follow up Recommendations Inpatient Rehab      Frequency and Duration min 3x week  2 weeks       Prognosis Prognosis for Safe Diet Advancement: Good       Swallow Study   General Date of Onset: 09/04/17 HPI: 48 yoM with hx HTN, HLD, CAD, GERD, and active tobacco abuse, presents with an acute CVA.  MRI of brain showed acute bilateral (larger left, smaller right) cerebellar nonhemorrhagic infarcts, left pontine infarct. Intubated 5/20-5/23. Type of Study: Bedside Swallow Evaluation Previous Swallow Assessment: no Diet Prior to this Study: NPO Temperature Spikes Noted: No Respiratory Status: Nasal cannula History of Recent Intubation: Yes Length of Intubations (days): 3 days Date extubated: 09/07/17 Behavior/Cognition: Alert;Cooperative Oral Cavity Assessment: Lesions;Other (comment);Excessive secretions(right tongue) Oral Care Completed by SLP: Yes Oral Cavity - Dentition: Adequate natural dentition Self-Feeding Abilities: Needs assist Patient Positioning: Upright in bed Baseline Vocal Quality: Aphonic Volitional Cough: Weak Volitional Swallow: Able to elicit    Oral/Motor/Sensory Function Overall Oral Motor/Sensory Function: Severe impairment Facial ROM: Reduced right;Suspected CN VII (facial) dysfunction Facial Symmetry: Abnormal symmetry right;Suspected CN VII (facial) dysfunction Facial Strength: Reduced right;Suspected CN VII (facial) dysfunction Facial Sensation: Reduced right;Suspected CN V (Trigeminal) dysfunction Lingual ROM: Reduced right;Suspected CN XII (hypoglossal) dysfunction Lingual Symmetry: Abnormal symmetry right;Suspected CN XII (hypoglossal) dysfunction Lingual Strength: Reduced   Ice Chips Ice chips: Impaired Presentation: Spoon Oral Phase Impairments: Reduced labial seal;Reduced lingual movement/coordination Oral Phase Functional Implications: Right anterior spillage;Oral residue Pharyngeal Phase Impairments: Suspected delayed Swallow;Multiple swallows;Cough - Delayed   Thin Liquid Thin Liquid: Impaired Presentation: Spoon;Cup Oral Phase Impairments: Reduced labial seal Oral Phase Functional Implications:  Right anterior spillage Pharyngeal  Phase Impairments: Multiple swallows;Cough - Delayed  Nectar Thick Nectar Thick Liquid: Not tested   Honey Thick Honey Thick Liquid: Not tested   Puree Puree: Not tested   Solid   GO   Solid: Not tested        Dillon Garcia 09/08/2017,3:35 PM

## 2017-09-08 NOTE — Care Management Note (Signed)
Case Management Note  Patient Details  Name: Dillon Garcia MRN: [id number] Date of Birth: 1955/11/18  Subjective/Objective:   Pt admitted on 09/04/17 with acute CVA.  PTA, pt independent, lives alone.                  Action/Plan: PT/OT recommending CIR, and consult ordered.  Left messages for pt's brother, [name], to confirm caregiver support post-discharge.  Will continue to follow.   Expected Discharge Date:                  Expected Discharge Plan:  [place]  In-House Referral:     Discharge planning Services  CM Consult  Post Acute Care Choice:    Choice offered to:     DME Arranged:    DME Agency:     HH Arranged:    [place] Agency:     Status of Service:  In process, will continue to follow  If discussed at Long Length of Stay Meetings, dates discussed:    Additional Comments:  09/08/17 J. Folasade Mooty, Therapist, sports, BSN  1705 Spoke with [name], pt's brother.  He states he cannot provide 24h care for pt at discharge, and there is no other family available to assist at dc.  Pt will likely need SNF at dc due to lack of caregiver support.  Will consult CSW to explore SNF options with pt's brother.   [name], RN, BSN  Trauma/Neuro ICU Case Manager [phone]

## 2017-09-08 NOTE — Progress Notes (Signed)
Physical Therapy Treatment Patient Details Name: Dillon Garcia MRN: [id number] DOB: 12-10-1955 Today's Date: 09/08/2017    History of Present Illness 63yoM with hx HTN, HLD, CAD, GERD, and Active tobacco abuse, presents with an acute CVA.  MRI of brain showed Acute LEFT greater than RIGHT pontocerebellar nonhemorrhagic ast medical history of GERD (gastroesophageal reflux disease), Heart disease, HTN (hypertension), Hyperlipidemia, Hypothyroid, Infectious endocarditis (2012), and Mitral valve disease.  Past surgical history that includes Median sternotomy and Mitral valve replacement.    PT Comments    Pt with low tone, raspy voice, attempting to answer some questions, but difficult to hear.  He followed one step commands with minimal delayed processing for me today, however, when we sat up on EOB pt immediately started coughing and it was near constant EOB.  External suction was used to attempt to control saliva and to try to get what was seemingly rattling around in the back of his throat.  He was able to stand with heavy mod assist at the side of the bed, but unable to safely take steps.  Pt was positioned back in bed at the end of the session.   Follow Up Recommendations  CIR     Equipment Recommendations  Wheelchair (measurements PT);Wheelchair cushion (measurements PT)    Recommendations for Other Services   NA     Precautions / Restrictions Precautions Precautions: Fall Precaution Comments: right sided deficits, dizziness, double vision.    Mobility  Bed Mobility Overal bed mobility: Needs Assistance Bed Mobility: Supine to Sit;Sit to Supine     Supine to sit: Mod assist;HOB elevated Sit to supine: Max assist   General bed mobility comments: Heavy mod assist to get to sitting EOB.  Pt able to move his left leg over, needs assist at his right (right leg with significant increase tone), and at trunk (although he was assisting with trunk flexion and using his left arm to help  pull to sitting EOB.  More of max assist to return to supine flat bed due to fatigue and exhaustion from near continuous coughing in sitting EOB.   Transfers Overall transfer level: Needs assistance Equipment used: None Transfers: Sit to/from Stand Sit to Stand: Mod assist         General transfer comment: Heavy mod assist with right leg blocked to stand EOB.  Pt able to push up on his left leg well, however, he does thrust his trunk to the right in standing requiring manual assist to come closer to midline.  Unable to take steps or safely transfer.        Modified Rankin (Stroke Patients Only) Modified Rankin (Stroke Patients Only) Pre-Morbid Rankin Score: No symptoms Modified Rankin: Severe disability     Balance Overall balance assessment: Needs assistance Sitting-balance support: Feet supported;No upper extremity supported;Single extremity supported Sitting balance-Leahy Scale: Poor Sitting balance - Comments: Mod to max assist EOB.  Pt started coughing and unable to hold his saliva in his mouth seated EOB.  I handed him the suction device and he was able to self manage his spit (he is left handed), however, he was nearly continuously coughing EOB.   Postural control: Posterior lean;Right lateral lean Standing balance support: Single extremity supported Standing balance-Leahy Scale: Poor                              Cognition Arousal/Alertness: Awake/alert Behavior During Therapy: WFL for tasks assessed/performed Overall Cognitive Status: Impaired/Different from  baseline Area of Impairment: Following commands                       Following Commands: Follows one step commands consistently;Follows one step commands with increased time       General Comments: Pt was able to follow one step commands well given some processing time, but difficult to assess orientation as he has a very low tone voice and is coughing near constantly in sitting EOB.               Pertinent Vitals/Pain Pain Assessment: Faces Faces Pain Scale: No hurt           PT Goals (current goals can now be found in the care plan section) Acute Rehab PT Goals Patient Stated Goal: difficulty speakin, low tone, hushed Progress towards PT goals: Progressing toward goals    Frequency    Min 4X/week      PT Plan Current plan remains appropriate       AM-PAC PT "6 Clicks" Daily Activity  Outcome Measure  Difficulty turning over in bed (including adjusting bedclothes, sheets and blankets)?: Unable Difficulty moving from lying on back to sitting on the side of the bed? : Unable Difficulty sitting down on and standing up from a chair with arms (e.g., wheelchair, bedside commode, etc,.)?: Unable Help needed moving to and from a bed to chair (including a wheelchair)?: Total Help needed walking in hospital room?: Total Help needed climbing 3-5 steps with a railing? : Total 6 Click Score: 6    End of Session Equipment Utilized During Treatment: Oxygen Activity Tolerance: Patient limited by fatigue Patient left: in bed   PT Visit Diagnosis: Other abnormalities of gait and mobility (R26.89);Other symptoms and signs involving the nervous system (T03.546)     Time: 5681-2751 PT Time Calculation (min) (ACUTE ONLY): 23 min  Charges:  $Therapeutic Activity: 23-37 mins           Lugene Hitt B. Bromide, [place], DPT [phone]            09/08/2017, 12:00 PM

## 2017-09-08 NOTE — Progress Notes (Signed)
CRITICAL VALUE ALERT  Critical Value: Critical Head CT results, new bleed, evolving infarct.   Date & Time Notied: 09/08/17  0600  Provider Notified: Leonel Ramsay  Orders Received/Actions taken: Ordered to hold anticoagulants until patient rounded on this  AM.

## 2017-09-08 NOTE — Progress Notes (Addendum)
Nutrition Follow-up  DOCUMENTATION CODES:   Non-severe (moderate) malnutrition in context of chronic illness  INTERVENTION:   Initiate Vital AF 1.2 at 55 mL/hr (1320 mL goal daily volume) via OGT.  MVI daily  Provides 1584 kcal, 99 grams of protein, 1069 mL H2O daily.  Monitor magnesium and phosphorus every 12 hours for 4 occurances, MD to replete as needed, as pt is at risk for refeeding syndrome given moderate malnutrition.  NUTRITION DIAGNOSIS:   Moderate Malnutrition related to chronic illness as evidenced by moderate muscle depletion, moderate fat depletion. Ongoing.   GOAL:   Patient will meet greater than or equal to 90% of their needs Progressing.   MONITOR:   TF tolerance, I & O's, Diet advancement   ASSESSMENT:   Pt with PMH of HTN, HLD, CAD, GERD, active tobacco abuse, hypothyroidism, MV disorder s/p cardiac valve replacement admitted with acute L cerebellar territory infarct. Pt intubated and transferred to [facility] from [facility].    Pt discussed during ICU rounds and with RN.   Per RN pt still with significant secretions Plan for TEE Tuesday PT recommends CIR  5/23 extubated 5/24 to have Cortrak placeded  Medications reviewed and include: colace, MVI Labs reviewed: Na 151 (H) off 3%   Diet Order:   Diet Order           Diet NPO time specified  Diet effective now          EDUCATION NEEDS:   No education needs have been identified at this time  Skin:  Skin Assessment: Reviewed RN Assessment  Last BM:  5/20  Height:   Ht Readings from Last 1 Encounters:  09/06/17 5\' 8"  (1.727 m)    Weight:   Wt Readings from Last 1 Encounters:  09/08/17 119 lb 14.9 oz (54.4 kg)    Ideal Body Weight:  70 kg  BMI:  Body mass index is 18.24 kg/m.  Estimated Nutritional Needs:   Kcal:  1600-1800  Protein:  87-104  Fluid:  > 1.7 L/day  Maylon Peppers RD, LDN, CNSC [phone] Pager [phone] After Hours Pager

## 2017-09-08 NOTE — Progress Notes (Signed)
Cortrak Tube Team Note:  Consult received to place a Cortrak feeding tube.   A 10 F Cortrak tube was placed in the LEFT nare and secured with a nasal bridle at 76 cm. Per the Cortrak monitor reading the tube tip is post-pyloric.   No x-ray is required. RN may begin using tube.   If the tube becomes dislodged please keep the tube and contact the Cortrak team at [email or web address] ([email or web address]) for replacement.  If after hours and replacement cannot be delayed, place a NG tube and confirm placement with an abdominal x-ray.   Kerman Passey MS, RD, [place], [place] [phone] Pager  [phone] Weekend/On-Call Pager

## 2017-09-08 NOTE — Progress Notes (Signed)
   Pt is scheduled for TEE and possible loop on Tuesday at 2:00. EP is aware of this.  Daune Perch, AGNP-C [facility] 09/08/2017  11:09 AM Pager: [phone]

## 2017-09-08 NOTE — Progress Notes (Signed)
PULMONARY / CRITICAL CARE MEDICINE   Name: Dillon Garcia MRN: [id number] DOB: January 24, 1956    ADMISSION DATE:  09/04/2017  CHIEF COMPLAINT:  Found unresponsive  HISTORY OF PRESENT ILLNESS:        62yoM with hx HTN, HLD, CAD, GERD, and Active tobacco abuse, presents with an acute cerebellar CVA. Reportedly he was found unresponsive in his car outside [place] with questionable seizure activity. He was taken initially to [place] where he was seen to have right right facial droop, and Head CT showed an acute left cerebellar infarct. He was initially following simple commands, but his mental status then began to decline. He received a bolus of mannitol on arrival here and was started on 3% saline which has subsequently ben discontinued.     He was extubated yesterday and so far this is been well-tolerated however he is requiring suctioning to clear upper airway secretions which she does not swallow adequately himself.  He is denying any dyspnea this morning.    PAST MEDICAL HISTORY :  He  has a past medical history of GERD (gastroesophageal reflux disease), Heart disease, HTN (hypertension), Hyperlipidemia, Hypothyroid, Infectious endocarditis (2012), and Mitral valve disease (2012).  PAST SURGICAL HISTORY: He  has a past surgical history that includes Median sternotomy and Mitral valve replacement (2012).  No Known Allergies  No current facility-administered medications on file prior to encounter.    Current Outpatient Medications on File Prior to Encounter  Medication Sig  . amLODipine (NORVASC) 5 MG tablet Take 10 mg by mouth daily.   Marland Kitchen aspirin EC 81 MG tablet Take 81 mg by mouth daily.  . famotidine (PEPCID) 10 MG tablet Take 10 mg by mouth at bedtime as needed for heartburn or indigestion.  Marland Kitchen lisinopril-hydrochlorothiazide (PRINZIDE,ZESTORETIC) 20-12.5 MG tablet Take 2 tablets by mouth daily.   . metoprolol succinate (TOPROL-XL) 100 MG 24 hr tablet Take 100 mg by mouth daily. Take  with or immediately following a meal.    FAMILY HISTORY:  His has no family status information on file.    SOCIAL HISTORY: He  reports that he has been smoking.  He uses smokeless tobacco.  REVIEW OF SYSTEMS:   Unobtainable  SUBJECTIVE:  Unobtainable  VITAL SIGNS: BP 140/85   Pulse 63   Temp 97.6 F (36.4 C) (Axillary)   Resp (!) 23   Ht 5\' 8"  (1.727 m) Comment: per pt report  Wt 119 lb 14.9 oz (54.4 kg)   SpO2 100%   BMI 18.24 kg/m   HEMODYNAMICS:    VENTILATOR SETTINGS: FiO2 (%):  [30 %] 30 %  INTAKE / OUTPUT: I/O last 3 completed shifts: In: 2690.8 [I.V.:1680.8; NG/GT:1010] Out: 2700 [Urine:2700]  PHYSICAL EXAMINATION: General: Remarkably alert and interacting by nodding, he is in no distress Neuro: He is nodding to questions for me, he cannot produce words.  The face is symmetric.  EOMs are obviously disconjugate and he complains of diplopia.  Neither eye will deviate to the left.   The face is symmetric, is able to move the left extremities but not the right. Cardiovascular: S1 and S2 are regular without murmur rub or gallop.  There is no JVD.       Lungs: Respirations are unlabored, there is no stridor.  There is symmetric air movement no wheezes and no rhonchi.     Abdomen: The abdomen is flat and soft without any overt organomegaly masses tenderness guarding or rebound   LABS:  BMET Recent Labs  Lab 09/06/17  0301  09/07/17 0516  09/07/17 2100 09/08/17 0415 09/08/17 0917  NA 160*   < > 153*   < > 153* 153* 151*  K 3.9  --  3.3*  --   --  3.5  --   CL 127*  --  116*  --   --  114*  --   CO2 24  --  29  --   --  29  --   BUN 13  --  10  --   --  8  --   CREATININE 1.26*  --  1.06  --   --  1.02  --   GLUCOSE 106*  --  130*  --   --  95  --    < > = values in this interval not displayed.    Electrolytes Recent Labs  Lab 09/06/17 0301 09/07/17 0516 09/07/17 1620 09/08/17 0415 09/08/17 0923  CALCIUM 8.5* 9.1  --  9.4  --   MG  --  2.0 2.0   --  1.7  PHOS  --  2.1* 3.2  --  4.1    CBC Recent Labs  Lab 09/06/17 0301 09/07/17 0516 09/08/17 0415  WBC 10.7* 10.7* 10.4  HGB 13.4 13.2 14.6  HCT 42.4 41.7 46.2  PLT 129* 129* 151    Coag's Recent Labs  Lab 09/04/17 0524  APTT 24  INR 0.94    Sepsis Markers Recent Labs  Lab 09/04/17 0602 09/04/17 0935 09/04/17 2118 09/04/17 2314  LATICACIDVEN 1.5 1.5  --  1.0  PROCALCITON  --   --  <0.10  --     ABG Recent Labs  Lab 09/04/17 2247 09/05/17 0300 09/07/17 0400  PHART 7.389 7.418 7.456*  PCO2ART 40.7 39.0 39.3  PO2ART 108.0 104 98.3    Liver Enzymes Recent Labs  Lab 09/04/17 0524 09/04/17 2118  AST 36 44*  ALT 24 24  ALKPHOS 60 59  BILITOT 1.3* 1.1  ALBUMIN 4.1 3.5    Cardiac Enzymes Recent Labs  Lab 09/04/17 0524 09/04/17 2118  TROPONINI <0.03 <0.03    Glucose Recent Labs  Lab 09/07/17 1238 09/07/17 1610 09/07/17 2023 09/08/17 0042 09/08/17 0356 09/08/17 0744  GLUCAP 113* 120* 120* 92 86 96    Imaging Ct Head Wo Contrast  Result Date: 09/08/2017 CLINICAL DATA:  Follow-up stroke. EXAM: CT HEAD WITHOUT CONTRAST TECHNIQUE: Contiguous axial images were obtained from the base of the skull through the vertex without intravenous contrast. COMPARISON:  CT HEAD Sep 06, 2017 FINDINGS: BRAIN: Evolving acute large LEFT and small RIGHT cerebellar and LEFT pontine nonhemorrhagic infarcts. Similar mild mass effect on fourth ventricle and cerebral aqua duct which remain patent. No hydrocephalus. No intraparenchymal hemorrhage. Old small RIGHT frontal lobe infarct. Nose supratentorial midline shift or mass effect. New linear density LEFT central sulcus extending to the superior sagittal sinus (axial image 24). Basal cisterns are patent. VASCULAR: Moderate calcific atherosclerosis of the carotid siphons. SKULL: No skull fracture. No significant scalp soft tissue swelling. SINUSES/ORBITS: Trace paranasal sinus mucosal thickening without air-fluid levels.  Mastoid air cells are well aerated.The included ocular globes and orbital contents are non-suspicious. OTHER: None. IMPRESSION: 1. Evolving acute LEFT greater than RIGHT cerebellar and LEFT pontine nonhemorrhagic infarcts. Mild mass effect on fourth ventricle without hydrocephalus. 2. New small volume LEFT subarachnoid hemorrhage versus thrombosed cortical vein. Electronically Signed   By: Elon Alas M.D.   On: 09/08/2017 05:48     STUDIES: Chest x-ray shows a well-placed endotracheal  tube and no infiltrate CULTURES: Sputum of 5/21 is growing moderate Haemophilus which is a beta-lactamase producer ANTIBIOTICS: Zosyn started 5/20 for possible aspiration  SIGNIFICANT EVENTS:   LINES/TUBES: Right IJ CVP line placed 5/20  DISCUSSION: This is a 62 year old with a history of MVR and presumed CAD as well as active smoking who was found with altered level of consciousness.  CT scan has shown a large left cerebellar infarct with pontine involvement.  ASSESSMENT / PLAN:  PULMONARY A: His mental status is good today and I have no concerns about him maintaining his airway from mental status standpoint.  There was some question of cord edema when he was reintubated and I will keep this in mind following extubation.  I am anticipating that he will be extubated in the next several minutes as he is tolerating a spontaneous breathing trial     CARDIOVASCULAR A: Echocardiogram is pending.  He continues his statin, but I have increased it to a more appropriate dose for secondary prevention today.  I would like to reintroduce his beta-blocker should his pulse rate rise.  He is currently on dual antiplatelet therapy   RENAL   GASTROINTESTINAL A: Prophylaxis is with Pepcid   HEMATOLOGIC A: SCDs alone for DVT prophylaxis due to the large volume of his cerebellar infarct   INFECTIOUS A: He has not evolved an infiltrate and his Zosyn has been discontinued.    ENDOCRINE A: Sliding scale  insulin is in place good glycemic control  NEUROLOGIC A: Large volume cerebellar infarct primarily involving the left cerebellum but also involving the right cerebellum and pons.  T scan today does not show evolution of hydrocephalus there is a question of whether or not there may be a thrombosed cortical vein and I am awaiting input from neurology as to whether not a hypercoagulable evaluation would be appropriate.  Unfortunately with his large volume cerebellar infarct I would think anticoagulation would be out of the question even if we are dealing with a hypercoagulable state.  Lars Masson, MD  Critical Care Medicine [facility] Pager: [phone]  09/08/2017, 10:21 AM

## 2017-09-08 NOTE — Progress Notes (Signed)
Inpatient Rehabilitation Admissions Coordinator  I will follow up on Monday with patient's progress with therapy to assist with planning disposition when medically ready.  Danne Baxter, RN, MSN Rehab Admissions Coordinator [phone] 09/08/2017 2:41 PM

## 2017-09-08 NOTE — Care Management Note (Signed)
Case Management Note  Patient Details  Name: Dillon Garcia MRN: [id number] Date of Birth: Nov 11, 1955  Subjective/Objective:   Pt admitted on 09/04/17 with acute CVA.  PTA, pt independent, lives alone.                  Action/Plan: PT/OT recommending CIR, and consult ordered.  Left messages for pt's brother, [name], to confirm caregiver support post-discharge.  Will continue to follow.   Expected Discharge Date:                  Expected Discharge Plan:  [place]  In-House Referral:     Discharge planning Services  CM Consult  Post Acute Care Choice:    Choice offered to:     DME Arranged:    DME Agency:     HH Arranged:    [place] Agency:     Status of Service:  In process, will continue to follow  If discussed at Long Length of Stay Meetings, dates discussed:    Additional Comments:  [name], RN, BSN  Trauma/Neuro ICU Case Manager [phone]

## 2017-09-08 NOTE — Progress Notes (Signed)
Occupational Therapy Treatment Patient Details Name: Dillon Garcia MRN: [id number] DOB: [birth year] Today's Date: 09/08/2017    History of present illness 62 yo M with hx HTN, HLD, CAD, GERD, and Active tobacco abuse, presents with an acute CVA.  MRI of brain showed Acute LEFT greater than RIGHT pontocerebellar nonhemorrhagic ast medical history of GERD (gastroesophageal reflux disease), Heart disease, HTN (hypertension), Hyperlipidemia, Hypothyroid, Infectious endocarditis (2012), and Mitral valve disease.  Past surgical history that includes Median sternotomy and Mitral valve replacement.   OT comments  Pt able to communicate brother and doctors names during session sitting eob to write. Pt smiling at the end of session after communicating with therapist. Pt could benefit from communication board with large print. Pt without glasses and normally wears glasses per patient to read.     Follow Up Recommendations  CIR;Supervision/Assistance - 24 hour    Equipment Recommendations  None recommended by OT    Recommendations for Other Services Rehab consult    Precautions / Restrictions Precautions Precautions: Fall Precaution Comments: right sided deficits, dizziness, double vision.       Mobility Bed Mobility Overal bed mobility: Needs Assistance Bed Mobility: Supine to Sit;Sit to Supine Rolling: Max assist   Supine to sit: Mod assist Sit to supine: Mod assist   General bed mobility comments: pt exiting on the left side. pt able to push up with L UE. pt demonstrates pushing at EOB   Transfers Overall transfer level: Needs assistance Equipment used: None Transfers: Sit to/from Stand Sit to Stand: Mod assist         General transfer comment: Heavy mod assist with right leg blocked to stand EOB.  Pt able to push up on his left leg well, however, he does thrust his trunk to the right in standing requiring manual assist to come closer to midline.  Unable to take steps or safely  transfer.      Balance Overall balance assessment: Needs assistance Sitting-balance support: Single extremity supported;Feet supported Sitting balance-Leahy Scale: Fair Sitting balance - Comments: pt pushing to the Right throughotu session. pt demonstrates incr posture with reaching for pen to write. pt reaching out sid ebase of support to communicate with therapist on paper Postural control: Posterior lean;Right lateral lean Standing balance support: Single extremity supported Standing balance-Leahy Scale: Poor                             ADL either performed or assessed with clinical judgement   ADL Overall ADL's : Needs assistance/impaired Eating/Feeding: NPO Eating/Feeding Details (indicate cue type and reason): secretions noted from Right of mouth and throat clearing Grooming: Wash/dry face;Minimal assistance Grooming Details (indicate cue type and reason): attempting to wipe face and needs cues to wipe Right of mouth Upper Body Bathing: Maximal assistance   Lower Body Bathing: Total assistance   Upper Body Dressing : Maximal assistance   Lower Body Dressing: Total assistance                 General ADL Comments: pt working with SLP on arrival. Ot sitting patient eob for balance and dynamic sitting balance during session     Vision       Perception     Praxis      Cognition Arousal/Alertness: Awake/alert Behavior During Therapy: WFL for tasks assessed/performed Overall Cognitive Status: Impaired/Different from baseline Area of Impairment: Following commands  Following Commands: Follows multi-step commands with increased time       General Comments: pt able to write brothers name "Dillon Garcia" and phone number. pt asking by writing "doctor sarah" to be called that is his PCP. pt able to communicate with pen and paper. pt wears glasses and having trouble with communication board        Exercises     Shoulder  Instructions       General Comments      Pertinent Vitals/ Pain       Pain Assessment: No/denies pain Faces Pain Scale: No hurt  Home Living                                          Prior Functioning/Environment              Frequency  Min 2X/week        Progress Toward Goals  OT Goals(current goals can now be found in the care plan section)  Progress towards OT goals: Progressing toward goals  Acute Rehab OT Goals Patient Stated Goal: difficulty speakin, low tone, hushed OT Goal Formulation: Patient unable to participate in goal setting Time For Goal Achievement: 09/19/17 Potential to Achieve Goals: Good ADL Goals Pt Will Perform Grooming: with mod assist;sitting Additional ADL Goal #1: Pt will maintain EOB sitting with mod A x 5 mins during simple grooming tasks Additional ADL Goal #2: Pt will participate in further visual assessment  Plan Discharge plan remains appropriate    Co-evaluation                 AM-PAC PT "6 Clicks" Daily Activity     Outcome Measure   Help from another person eating meals?: Total Help from another person taking care of personal grooming?: Total Help from another person toileting, which includes using toliet, bedpan, or urinal?: Total Help from another person bathing (including washing, rinsing, drying)?: Total Help from another person to put on and taking off regular upper body clothing?: Total Help from another person to put on and taking off regular lower body clothing?: Total 6 Click Score: 6    End of Session Equipment Utilized During Treatment: Oxygen  OT Visit Diagnosis: Hemiplegia and hemiparesis Hemiplegia - Right/Left: Right Hemiplegia - dominant/non-dominant: Dominant Hemiplegia - caused by: Cerebral infarction   Activity Tolerance Patient tolerated treatment well   Patient Left in bed;with call bell/phone within reach;with family/visitor present   Nurse Communication Mobility  status;Precautions        Time: 1446(1446)-1515 OT Time Calculation (min): 29 min  Charges: OT General Charges $OT Visit: 1 Visit OT Treatments $Self Care/Home Management : 8-22 mins $Therapeutic Activity: 8-22 mins   Jeri Modena   OTR/L Pager: [phone] Office: [phone] .    Parke Poisson B 09/08/2017, 3:34 PM

## 2017-09-08 NOTE — Progress Notes (Addendum)
STROKE TEAM PROGRESS NOTE   SUBJECTIVE (INTERVAL HISTORY) His RN is at the bedside. Extubated yesterday, sats and VS good but still with significant secretions requiring frequent suctioning. For therapy evals today. Concern he will fail swallow.   CT read as hmg. Questionable hmg vs slow flow in the cortical vein. Will stop plavix for now and repeat CT Monday.  Patient reports the day of the stroke he went shopping, he never got out of the car to shop because he couldn't. He waved for help. Reports he exercises routinely. No hx atrial fibrillation.  OBJECTIVE Temp:  [97.6 F (36.4 C)-98.5 F (36.9 C)] 97.6 F (36.4 C) (05/24 0400) Pulse Rate:  [51-99] 63 (05/24 0900) Cardiac Rhythm: Normal sinus rhythm (05/23 1600) Resp:  [14-25] 23 (05/24 0900) BP: (124-172)/(78-93) 140/85 (05/24 0900) SpO2:  [93 %-100 %] 100 % (05/24 0900) FiO2 (%):  [30 %] 30 % (05/23 1200) Weight:  [54.4 kg (119 lb 14.9 oz)] 54.4 kg (119 lb 14.9 oz) (05/24 0412)  Recent Labs  Lab 09/07/17 1610 09/07/17 2023 09/08/17 0042 09/08/17 0356 09/08/17 0744  GLUCAP 120* 120* 92 86 96   Recent Labs  Lab 09/04/17 0524 09/04/17 0939  09/04/17 2118 09/05/17 0300  09/06/17 0301  09/07/17 0516 09/07/17 1004 09/07/17 1620 09/07/17 2100 09/08/17 0415  NA 143  --    < > 146* 148*   < > 160*   < > 153* 153* 153* 153* 153*  K 3.4*  --   --  3.5 4.8  --  3.9  --  3.3*  --   --   --  3.5  CL 105  --   --  111 115*  --  127*  --  116*  --   --   --  114*  CO2 24  --   --  24 26  --  24  --  29  --   --   --  29  GLUCOSE 104*  --   --  122* 113*  --  106*  --  130*  --   --   --  95  BUN 24*  --   --  20 17  --  13  --  10  --   --   --  8  CREATININE 1.28*  --   --  1.26* 1.40*  --  1.26*  --  1.06  --   --   --  1.02  CALCIUM 9.4  --   --  8.8* 8.4*  --  8.5*  --  9.1  --   --   --  9.4  MG  --  1.6*  --  2.6* 2.6*  --   --   --  2.0  --  2.0  --   --   PHOS 4.3  --   --  3.3 2.9  --   --   --  2.1*  --  3.2  --    --    < > = values in this interval not displayed.   Recent Labs  Lab 09/04/17 0524 09/04/17 2118  AST 36 44*  ALT 24 24  ALKPHOS 60 59  BILITOT 1.3* 1.1  PROT 6.9 5.8*  ALBUMIN 4.1 3.5   Recent Labs  Lab 09/04/17 0524 09/04/17 2118 09/05/17 0300 09/06/17 0301 09/07/17 0516 09/08/17 0415  WBC 13.7* 14.6* 13.0* 10.7* 10.7* 10.4  NEUTROABS 11.0* 11.5*  --   --  7.4  --  HGB 16.9 15.5 14.5 13.4 13.2 14.6  HCT 50.2 46.5 44.2 42.4 41.7 46.2  MCV 89.2 88.7 89.3 93.6 93.1 93.1  PLT 162 156 139* 129* 129* 151   Recent Labs  Lab 09/04/17 0524 09/04/17 2118  TROPONINI <0.03 <0.03   No results for input(s): LABPROT, INR in the last 72 hours. No results for input(s): COLORURINE, LABSPEC, [place], GLUCOSEU, HGBUR, BILIRUBINUR, KETONESUR, PROTEINUR, UROBILINOGEN, NITRITE, LEUKOCYTESUR in the last 72 hours.  Invalid input(s): APPERANCEUR     Component Value Date/Time   CHOL 79 09/05/2017 0300   TRIG 102 09/05/2017 0300   HDL 42 09/05/2017 0300   CHOLHDL 1.9 09/05/2017 0300   VLDL 20 09/05/2017 0300   LDLCALC 17 09/05/2017 0300   Lab Results  Component Value Date   HGBA1C 5.5 09/05/2017      Component Value Date/Time   LABOPIA NONE DETECTED 09/04/2017 2314   COCAINSCRNUR NONE DETECTED 09/04/2017 2314   COCAINSCRNUR NONE DETECTED 09/04/2017 0602   LABBENZ POSITIVE (A) 09/04/2017 2314   AMPHETMU NONE DETECTED 09/04/2017 2314   THCU NONE DETECTED 09/04/2017 2314   LABBARB NONE DETECTED 09/04/2017 2314    Recent Labs  Lab 09/04/17 0524  ETH <10   Ct Head Wo Contrast  Result Date: 09/08/2017 CLINICAL DATA:  Follow-up stroke. EXAM: CT HEAD WITHOUT CONTRAST TECHNIQUE: Contiguous axial images were obtained from the base of the skull through the vertex without intravenous contrast. COMPARISON:  CT HEAD Sep 06, 2017 FINDINGS: BRAIN: Evolving acute large LEFT and small RIGHT cerebellar and LEFT pontine nonhemorrhagic infarcts. Similar mild mass effect on fourth ventricle  and cerebral aqua duct which remain patent. No hydrocephalus. No intraparenchymal hemorrhage. Old small RIGHT frontal lobe infarct. Nose supratentorial midline shift or mass effect. New linear density LEFT central sulcus extending to the superior sagittal sinus (axial image 24). Basal cisterns are patent. VASCULAR: Moderate calcific atherosclerosis of the carotid siphons. SKULL: No skull fracture. No significant scalp soft tissue swelling. SINUSES/ORBITS: Trace paranasal sinus mucosal thickening without air-fluid levels. Mastoid air cells are well aerated.The included ocular globes and orbital contents are non-suspicious. OTHER: None. IMPRESSION: 1. Evolving acute LEFT greater than RIGHT cerebellar and LEFT pontine nonhemorrhagic infarcts. Mild mass effect on fourth ventricle without hydrocephalus. 2. New small volume LEFT subarachnoid hemorrhage versus thrombosed cortical vein. Electronically Signed   By: Elon Alas M.D.   On: 09/08/2017 05:48   Ct Head Wo Contrast  Result Date: 09/06/2017 CLINICAL DATA:  Follow-up stroke. History of hypertension, hyperlipidemia, LEFT vertebral artery occlusion. EXAM: CT HEAD WITHOUT CONTRAST TECHNIQUE: Contiguous axial images were obtained from the base of the skull through the vertex without intravenous contrast. COMPARISON:  CT HEAD Sep 05, 2017. FINDINGS: BRAIN: Evolving acute large LEFT and small RIGHT cerebellar and LEFT pontine nonhemorrhagic infarcts. Mild mass effect on the fourth ventricle and cerebral aqua duct which remain patent. No hydrocephalus. No intraparenchymal hemorrhage. Old small RIGHT frontal lobe better seen today. No parenchymal brain volume loss for age. No supratentorial midline shift or mass effect. No abnormal extra-axial fluid collections. Basal cisterns are patent. VASCULAR: Unremarkable. SKULL: No skull fracture. No significant scalp soft tissue swelling. SINUSES/ORBITS: Mild paranasal sinus mucosal thickening. Mastoid air cells are well  aerated.The included ocular globes and orbital contents are non-suspicious. OTHER: None. IMPRESSION: 1. Evolving acute LEFT greater than RIGHT cerebellar and LEFT pontine nonhemorrhagic infarcts. Similar cytotoxic edema with mild mass effect on fourth ventricle. No hydrocephalus. Electronically Signed   By: Elon Alas M.D.   On:  09/06/2017 05:20   Ct Head Wo Contrast  Result Date: 09/05/2017 CLINICAL DATA:  Follow-up examination for acute stroke. EXAM: CT HEAD WITHOUT CONTRAST TECHNIQUE: Contiguous axial images were obtained from the base of the skull through the vertex without intravenous contrast. COMPARISON:  Prior CT and MRI from 09/04/2017. FINDINGS: Brain: Continued interval evolution of left greater than right acute ischemic nonhemorrhagic bilateral cerebellar infarcts, left greater than right, with large confluent left SCA territory infarct. Associated left pontine infarct noted as well, better seen on recent MRI. Overall, size and distribution relatively similar to previous. Mildly increased localized edema with increased partial effacement of the left perimesencephalic cistern. Fourth ventricle remains widely patent. No hydrocephalus. No evidence for hemorrhagic transformation. Otherwise stable appearance of the brain. No other acute intracranial infarct or hemorrhage. No mass lesion or midline shift. No hydrocephalus. No extra-axial fluid collection. Vascular: No hyperdense vessel. Skull: Scalp soft tissues and calvarium within normal limits. Sinuses/Orbits: Globes and orbital soft tissues demonstrate no acute finding. Mucosal thickening throughout the ethmoidal air cells and maxillary sinuses with air-fluid level within the right maxillary sinus. Mastoid air cells remain clear. Other: None. IMPRESSION: 1. Continued interval evolution of acute ischemic left greater than right pontocerebellar infarcts, with slightly increased localized edema as compared to previous. Adjacent fourth ventricle  remains patent without hydrocephalus. No evidence for hemorrhagic transformation or other complication. 2. No other new acute intracranial abnormality. Electronically Signed   By: Jeannine Boga M.D.   On: 09/05/2017 01:18   Ct Head Wo Contrast  Result Date: 09/04/2017 CLINICAL DATA:  Initial evaluation for acute unresponsiveness. EXAM: CT HEAD WITHOUT CONTRAST TECHNIQUE: Contiguous axial images were obtained from the base of the skull through the vertex without intravenous contrast. COMPARISON:  None. FINDINGS: Brain: Generalized age-related cerebral atrophy. Mild chronic small vessel ischemic change present within the hemispheric cerebral white matter. Confluent hypodensity involving the superior left cerebellar hemisphere, consistent with evolving acute ischemic left superior cerebellar artery territory infarct. No associated hemorrhage. No significant mass effect at this time. Adjacent fourth ventricle remains patent. No inferior herniation. No other evidence for acute large vessel territory infarct. No intracranial hemorrhage. No mass lesion or midline shift. No hydrocephalus. No extra-axial fluid collection. Vascular: No hyperdense vessel. Scattered vascular calcifications noted within the carotid siphons. Skull: Scalp soft tissues and calvarium within normal limits. Sinuses/Orbits: Globes and orbital soft tissues within normal limits. Small air-fluid level noted within the right maxillary sinus. Scattered mucosal thickening within the ethmoidal air cells. Visualized mastoids are clear. Other: None. IMPRESSION: 1. Evolving acute ischemic infarct involving the superior left cerebellar hemisphere, left superior cerebellar artery territory. No associated hemorrhage or significant mass effect at this time. 2. Age-related cerebral atrophy with mild chronic small vessel ischemic disease. Critical Value/emergent results were called by telephone at the time of interpretation on 09/04/2017 at 5:56 am to Dr.  Lurline Hare , who verbally acknowledged these results. Electronically Signed   By: Jeannine Boga M.D.   On: 09/04/2017 05:56   Ct Angio Neck W Or Wo Contrast  Result Date: 09/05/2017 CLINICAL DATA:  62 y/o  M; follow-up of stroke. EXAM: CT ANGIOGRAPHY NECK TECHNIQUE: Multidetector CT imaging of the neck was performed using the standard protocol during bolus administration of intravenous contrast. Multiplanar CT image reconstructions and MIPs were obtained to evaluate the vascular anatomy. Carotid stenosis measurements (when applicable) are obtained utilizing NASCET criteria, using the distal internal carotid diameter as the denominator. CONTRAST:  68mL ISOVUE-370 IOPAMIDOL (ISOVUE-370) INJECTION 76% COMPARISON:  09/04/2017 MRI and MRA of the head. 09/05/2017 CT of the head. FINDINGS: Aortic arch: Bovine variant branching. Imaged portion shows no evidence of aneurysm or dissection. No significant stenosis of the major arch vessel origins. Mild calcific atherosclerosis. Right carotid system: No evidence of dissection, stenosis (50% or greater) or occlusion. Left carotid system: No evidence of dissection, stenosis (50% or greater) or occlusion. Vertebral arteries: Left vertebral artery origin occlusion/near occlusion, short segment of patency of V1, occlusion of V2 to the C2 level, patent vertebral artery from the C2 level to the vertebrobasilar junction. Poor opacification of the distal V2, V3, and V4 segments, probably due to retrograde flow. No evidence of dissection, stenosis (50% or greater) or occlusion of right vertebral artery. Skeleton: C2-C5 posterior instrumented fusion and C3-4 interbody fusion with laminectomy. C7-T1 grade 1 anterolisthesis with prominent facet arthropathy. Other neck: Right central venous catheter tip extends into the SVC below the field of view. Enteric tube within the esophagus extending below the field of view. Endotracheal tube tip 4 cm above carina. Upper chest: Negative.  IMPRESSION: 1. 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 2. Separate segment of left vertebral artery origin occlusion/near occlusion. 3. Patent distal left V2, V3, and V4 with hypoenhancement, probably representing retrograde flow. 4. Patent right vertebral and bilateral carotid systems without significant stenosis by NASCET criteria. Electronically Signed   By: Kristine Garbe M.D.   On: 09/05/2017 18:16   Mr Brain Wo Contrast  Result Date: 09/04/2017 CLINICAL DATA:  Follow-up LEFT cerebellar infarct. Found unresponsive in car at [employer]. Critically ill. History of hypertension. EXAM: MRI HEAD WITHOUT CONTRAST MRA HEAD WITHOUT CONTRAST TECHNIQUE: Multiplanar, multiecho pulse sequences of the brain and surrounding structures were obtained without intravenous contrast. Angiographic images of the head were obtained using MRA technique without contrast. COMPARISON:  CT HEAD Sep 04, 2017 at 0527 hours FINDINGS: Multiple sequences are moderately motion degraded. MRI HEAD FINDINGS INTRACRANIAL CONTENTS: Confluent reduced diffusion LEFT pons, LEFT cerebellum predominately involving the superior portion. Patchy reduced diffusion medial mesial RIGHT cerebellum. All areas of reduced diffusion demonstrate low ADC values and bright FLAIR signal. No susceptibility artifact to suggest hemorrhage. Fourth ventricle is open though, there is mild upward herniation and narrowed LEFT quadrigeminal cistern. No parenchymal brain volume loss for age. No supratentorial midline shift. No masses. No abnormal extra-axial fluid collections. VASCULAR: T2 bright signal LEFT C1 foramen transversarium corresponding to LEFT vertebral artery. Otherwise normal major intracranial vascular flow voids present at skull base. SKULL AND UPPER CERVICAL SPINE: No abnormal sellar expansion. No suspicious calvarial bone marrow  signal. Posterior cervical spine susceptibility artifact likely reflects hardware. Craniocervical junction maintained. SINUSES/ORBITS: Mild paranasal sinus mucosal thickening. Air-fluid level in the pharynx due to life-support lines. Mastoid air cells are well aerated.The included ocular globes and orbital contents are non-suspicious. OTHER: Life-support lines in place. MRA HEAD FINDINGS ANTERIOR CIRCULATION: Normal flow related enhancement of the included cervical, petrous, cavernous and supraclinoid internal carotid arteries. Patent anterior communicating artery. Patent anterior and middle cerebral arteries, mild luminal irregularity seen with atherosclerosis or motion artifact. No large vessel occlusion, flow limiting stenosis though limited by motion, aneurysm. POSTERIOR CIRCULATION: RIGHT vertebral artery is dominant. For flow related enhancement LEFT vertebral artery with retrograde flow at vertebrobasilar junction. Patent RIGHT vertebral artery mild luminal irregularity seen with motion artifact or atherosclerosis. No flow limiting stenosis though limited by motion, aneurysm. ANATOMIC VARIANTS:  None. Source images and MIP images were reviewed. IMPRESSION: MRI HEAD: 1. Motion degraded examination. 2. 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 Regional mass effect without obstructive hydrocephalus. MRA HEAD: 1. Slow flow versus occluded LEFT vertebral artery. 2. No flow limiting stenosis on this motion degraded examination. Electronically Signed   By: Elon Alas M.D.   On: 09/04/2017 15:33   Dg Pelvis Portable  Result Date: 09/04/2017 CLINICAL DATA:  For MRI clearance. EXAM: PORTABLE PELVIS 1-2 VIEWS COMPARISON:  None. FINDINGS: Foley catheter in place. No metallic devices or foreign bodies are visible. Bones appear normal. Bowel gas pattern is normal. IMPRESSION: Negative exam for metal in the pelvis. Electronically Signed   By: Lorriane Shire M.D.   On: 09/04/2017 09:59   Dg Chest Port 1 View  Result Date: 09/07/2017 CLINICAL DATA:  Hypoxia EXAM: PORTABLE CHEST 1 VIEW COMPARISON:  Sep 06, 2017 FINDINGS: Endotracheal tube tip is 4.1 cm above the carina. Central catheter tip is in the superior vena cava. Nasogastric tube tip and side port are in the stomach. No adenopathy. There is no evident edema or consolidation. The heart size and pulmonary vascularity are normal. No adenopathy. There is aortic atherosclerosis. No evident bone lesions. IMPRESSION: Tube and catheter positions as described without pneumothorax. No edema or consolidation. There is aortic atherosclerosis. Aortic Atherosclerosis (ICD10-I70.0). Electronically Signed   By: Lowella Grip III M.D.   On: 09/07/2017 09:18   Dg Chest Port 1 View  Result Date: 09/06/2017 CLINICAL DATA:  Encounter for intubation. EXAM: PORTABLE CHEST 1 VIEW COMPARISON:  Yesterday at 05:55 hour FINDINGS: Endotracheal tube tip 5.3 cm from the carina. Tip and side port of the enteric tube below the diaphragm in the stomach. Right internal jugular central venous catheter tip in the mid SVC. Patient is post median sternotomy. Normal heart size. Atherosclerosis of the aortic arch. Mild hyperinflation. No consolidation, pleural effusion or pneumothorax. IMPRESSION: 1. Endotracheal tube, enteric tube, and right central line in appropriate position. 2. Otherwise unchanged appearance of the chest without acute abnormality. Electronically Signed   By: Jeb Levering M.D.   On: 09/06/2017 01:51   Dg Chest Port 1 View  Result Date: 09/05/2017 CLINICAL DATA:  Ventilator dependence. EXAM: PORTABLE CHEST 1 VIEW COMPARISON:  09/04/2017 FINDINGS: 0555 hours. Endotracheal tube tip is 5.8 cm above the base of the carina. The NG tube passes into the stomach although the distal tip position is not included on the film. Right IJ central line tip overlies the mid SVC. The lungs are clear without focal pneumonia,  edema, pneumothorax or pleural effusion. Symmetric nodular densities overlying the lower lungs bilaterally are likely nipple shadows. Cardiopericardial silhouette is at upper limits of normal for size. The visualized bony structures of the thorax are intact. Telemetry leads overlie the chest. IMPRESSION: Stable.  No acute cardiopulmonary findings. Electronically Signed   By: Misty Stanley M.D.   On: 09/05/2017 08:19   Dg Chest Port 1 View  Result Date: 09/04/2017 CLINICAL DATA:  Central line placement EXAM: PORTABLE CHEST 1 VIEW COMPARISON:  09/04/2017 FINDINGS: Endotracheal tube tip is about 4.1 cm superior to the carina. Esophageal tube tip extends below diaphragm but the tip is non included. Right central venous catheter tip overlies the SVC. No pneumothorax. Post sternotomy changes. Clear lung fields. Stable cardiomediastinal silhouette. IMPRESSION: 1. Right IJ central venous catheter tip overlies the SVC. Negative for pneumothorax. 2. Endotracheal tube tip about 4.1 cm superior to carina 3.  Clear lung fields Electronically Signed   By: Donavan Foil M.D.   On: 09/04/2017 23:48   Dg Chest Port 1 View  Result Date: 09/04/2017 CLINICAL DATA:  Intubation. EXAM: PORTABLE CHEST 1 VIEW COMPARISON:  Chest radiograph Sep 12, 2017 at 0532 hours FINDINGS: Endotracheal tube tip projects 6.4 cm above the carina. Nasogastric tube past proximal stomach. Gas distended stomach. Cardiomediastinal silhouette is normal. Calcified aortic arch. Mild bronchitic changes without pleural effusion or focal consolidation. No pneumothorax. Osseous structures are unchanged. IMPRESSION: Endotracheal tube tip projects 6.4 cm above the carina, consider 1-2 cm advancement. Nasogastric tube past proximal stomach, gas distended stomach. Mild bronchitic changes. Aortic Atherosclerosis (ICD10-I70.0). Electronically Signed   By: Elon Alas M.D.   On: 09/04/2017 21:58   Dg Chest Port 1 View  Result Date: 09/04/2017 CLINICAL DATA:   Intubation. EXAM: PORTABLE CHEST 1 VIEW COMPARISON:  None. FINDINGS: Endotracheal tube tip just above the clavicular heads 7.4 cm from the carina. Advancement of 1-2 cm would lead to optimal placement. Post median sternotomy. Heart size is normal. No pulmonary edema. Patchy left infrahilar opacities partially obscured by overlying monitoring devices. No large pleural effusion or pneumothorax. IMPRESSION: 1. Endotracheal tube tip just above the clavicular heads 7.4 cm from the carina, advancement of 1-2 cm would lead to optimal placement. 2. Patchy left infrahilar opacities likely atelectasis, partially obscured by overlying monitoring device. Electronically Signed   By: Jeb Levering M.D.   On: 09/04/2017 06:47   Dg Abd Portable 1v  Result Date: 09/06/2017 CLINICAL DATA:  OG tube placement EXAM: PORTABLE ABDOMEN - 1 VIEW COMPARISON:  09/06/2017 FINDINGS: OG tube is in the stomach with the tip in the fundus of the stomach. Nonobstructive bowel gas pattern. IMPRESSION: OG tube tip in the fundus of the stomach. Electronically Signed   By: Rolm Baptise M.D.   On: 09/06/2017 17:32   Dg Abd Portable 1v  Result Date: 09/06/2017 CLINICAL DATA:  Gastric tube placement EXAM: PORTABLE ABDOMEN - 1 VIEW COMPARISON:  09/04/2017 FINDINGS: Gastric tube has been advanced in the stomach with the tip now in the region of the gastric antrum. Stomach decompressed. No bowel obstruction. IMPRESSION: NG tip in the region of the gastric antrum. Electronically Signed   By: Franchot Gallo M.D.   On: 09/06/2017 12:04   Dg Abd Portable 1v  Result Date: 09/04/2017 CLINICAL DATA:  Screening for MRI. EXAM: PORTABLE ABDOMEN - 1 VIEW COMPARISON:  None. FINDINGS: Portable supine view the abdomen and pelvis shows gaseous distention of the stomach despite the presence of an NG tube with the distal tip positioned in the mid stomach. No gaseous bowel dilatation. Visualized lung bases are unremarkable. The patient does have a temperature  probe overlying the inferior pelvis. Otherwise, no findings to suggest metallic foreign body in the abdomen or pelvis. IMPRESSION: 1. Apparent temperature probe overlies the inferior anatomic pelvis and appears to contain metallic wire. 2. Otherwise no unexpected metallic foreign body over the abdomen or pelvis. Electronically Signed   By: Misty Stanley M.D.   On: 09/04/2017 10:07   Mr Jodene Nam Head/brain JW Cm  Result Date: 09/04/2017 CLINICAL DATA:  Follow-up LEFT cerebellar infarct. Found unresponsive in car at [employer]. Critically ill. History of hypertension. EXAM: MRI HEAD WITHOUT CONTRAST MRA HEAD WITHOUT CONTRAST TECHNIQUE: Multiplanar, multiecho pulse sequences of the brain and surrounding structures were obtained without intravenous contrast. Angiographic images of the head were obtained using MRA technique without contrast. COMPARISON:  CT HEAD Sep 04, 2017  at 0527 hours FINDINGS: Multiple sequences are moderately motion degraded. MRI HEAD FINDINGS INTRACRANIAL CONTENTS: Confluent reduced diffusion LEFT pons, LEFT cerebellum predominately involving the superior portion. Patchy reduced diffusion medial mesial RIGHT cerebellum. All areas of reduced diffusion demonstrate low ADC values and bright FLAIR signal. No susceptibility artifact to suggest hemorrhage. Fourth ventricle is open though, there is mild upward herniation and narrowed LEFT quadrigeminal cistern. No parenchymal brain volume loss for age. No supratentorial midline shift. No masses. No abnormal extra-axial fluid collections. VASCULAR: T2 bright signal LEFT C1 foramen transversarium corresponding to LEFT vertebral artery. Otherwise normal major intracranial vascular flow voids present at skull base. SKULL AND UPPER CERVICAL SPINE: No abnormal sellar expansion. No suspicious calvarial bone marrow signal. Posterior cervical spine susceptibility artifact likely reflects hardware. Craniocervical junction maintained. SINUSES/ORBITS: Mild paranasal  sinus mucosal thickening. Air-fluid level in the pharynx due to life-support lines. Mastoid air cells are well aerated.The included ocular globes and orbital contents are non-suspicious. OTHER: Life-support lines in place. MRA HEAD FINDINGS ANTERIOR CIRCULATION: Normal flow related enhancement of the included cervical, petrous, cavernous and supraclinoid internal carotid arteries. Patent anterior communicating artery. Patent anterior and middle cerebral arteries, mild luminal irregularity seen with atherosclerosis or motion artifact. No large vessel occlusion, flow limiting stenosis though limited by motion, aneurysm. POSTERIOR CIRCULATION: RIGHT vertebral artery is dominant. For flow related enhancement LEFT vertebral artery with retrograde flow at vertebrobasilar junction. Patent RIGHT vertebral artery mild luminal irregularity seen with motion artifact or atherosclerosis. No flow limiting stenosis though limited by motion, aneurysm. ANATOMIC VARIANTS: None. Source images and MIP images were reviewed. IMPRESSION: MRI HEAD: 1. Motion degraded examination. 2. 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 Regional mass effect without obstructive hydrocephalus. MRA HEAD: 1. Slow flow versus occluded LEFT vertebral artery. 2. No flow limiting stenosis on this motion degraded examination. Electronically Signed   By: Elon Alas M.D.   On: 09/04/2017 15:33   TTE - Left ventricle: The cavity size was normal. Wall thickness was normal. Systolic function was vigorous. The estimated ejection fraction was in the range of 65% to 70%. Wall motion was normal; there were no regional wall motion abnormalities. Doppler parameters are consistent with abnormal left ventricular relaxation (grade 1 diastolic dysfunction). - Mitral valve: A bioprosthesis was present and functioning normally. Mean gradient (D): 3 mm Hg. Valve area by continuity equation (using LVOT flow):  1.88 cm^2. Impressions:  No cardiac source of emboli was indentified.  LE venous doppler  Right: There is no evidence of deep vein thrombosis in the lower extremity.There is no evidence of superficial venous thrombosis. No cystic structure found in the popliteal fossa. Left: There is no evidence of deep vein thrombosis in the lower extremity.There is no evidence of superficial venous thrombosis. No cystic structure found in the popliteal fossa.  EEG The EEG is normal with patient recording awake and drowsy state only.  TEE pending Tues   PHYSICAL EXAM  General - Well nourished, well developed, extubated.  Cardiovascular - Regular rate and rhythm.  Neuro - awake, alert, oriented to person, place and situation. Follows all simple commands. PERRL, both eyes left gaze palsy, will cross midline with effort, b/l eye right, up and downward gaze with nystagmus. Double vision. Can count fingers all fields but decreased L peripheral vision. Significant right facial droop. Significant dysarthria with lots of secretions. Tongue midline in mouth. No aphasia. LUE 4/5, LLE 4+/5 at least. No babinski. RUE 0/5 and RLE 3-/5, with  positive babinski. DTR 1+. Sensation intact bilaterally. Decreased coordination left. gait not tested.   ASSESSMENT/PLAN [name] is a 62 y.o. male with history of HTN and HLD admitted for unresponsive in car with questionable shaking episode, concerning for seizure. Also found to have right facial droop and was intubated for airway protection. No tPA given due to OSW.   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 cardioembolic  Resultant intubated, eye movement difficulty, right facial droop, right hemiplegia  MRI left SCA and left pontine large infarcts as well as punctate right PICA infarct  MRA  Left VA slow flow vs. Occlusion  CTA neck left V4 and VA origin  occlusion  CT repeat 5/24 evolving L>R cerebellar and L pontine infarcts. Mild mass effect 4th vent but no hydrocephalus. New sm L SAH vs thrombosed vein. Given questionable hmg vs slow flow cortical vein, will hold plavix for now and repeat CT Monday.  2D Echo EF 65-70%  LE venous doppler no DVT  LDL 17  HgbA1c 5.5  NPO. Place [place] for feeding  Consider TEE and loop once medically more stabilized - will tentative place on schedule for Tues. Let Wannetta Sender know if pt not stable on Monday for TEE Tues.  Heparin subq for VTE prophylaxis  aspirin 81 mg daily prior to admission, now on aspirin 325 mg daily and clopidogrel 75 mg daily. Given questionable hmg will hold plavix for now, continue aspirin and repeat CT Monday. If no hmg Monday, resume plavix.  Ongoing aggressive stroke risk factor management  Therapy recommendations:  Pending. For evals today  Disposition:  Pending  Cerebellar edema  MRI showed large left cerebellar infarct  Repeat CT stable no hydrocephalus  Repeat CT no hydrocephalus  Off 3% saline due to high Na -> 1/2NS -> NS  Na 144->158->161->153->153  Na Q12h, goal 150-155  Respiratory distress  Extubated.  Lots of secretions  CCM on board  Keep in ICU for moniotring  Seizure-like activity  Doubt the "shaking" saw by EMS was seizure  Could be due to limb posturing due to brain stem infarct  EEG normal   No need AED this time  Hypertension Stable Keep SBP goal < 180   Long term BP goal normotensive  Hyperlipidemia  Home meds:  lipitor 40   LDL 17, goal < 70  Now lipitor down to 10 mg  Continue lipitor 10 on discharge  Other Stroke Risk Factors  Advanced age  Other Active Problems  Leukocytosis WBC 14.6->13.0->10.7->10.7->10.4  AKI, resolved - Cre 1.40->1.26->1.06->1.02  Hospital day # [place], MSN, APRN, ANVP-BC, AGPCNP-BC Advanced Practice Stroke Nurse [place] for Schedule &  Pager information 09/08/2017 10:15 AM   ATTENDING NOTE: I reviewed above note and agree with the assessment and plan. I have made any additions or clarifications directly to the above note. Pt was seen and examined.   Pt extubated yesterday, tolerating well so far. However, copious secretions and needs frequent suctioning. The secretions also bloody due to frequent suctioning. However, neuro stable. Repeat CT this am showed stable infarct without hydrocephalus. However, it repeated left parafalcine frontal SAH vs. Thrombosed cortical vein. I reviewed the picture, it was most likely the latter. However, will d/c plavix at this time, continue ASA 325 and repeat CT head next Monday (stat CT if neuro changes). Continue CCM care today for respiratory monitoring.  Pending TEE and loop next week.   Rosalin Hawking, MD PhD Stroke Neurology 09/08/2017 11:41 AM  This patient is critically ill due to large posterior circulation stroke, respiratory failure, cerebral edema, seizure activity and at significant risk of neurological worsening, death form hydrocephalus, recurrent stroke, hemorrhagic conversion, status epilepticus. This patient's care requires constant monitoring of vital signs, hemodynamics, respiratory and cardiac monitoring, review of multiple databases, neurological assessment, discussion with family, other specialists and medical decision making of high complexity. I spent 35 minutes of neurocritical care time in the care of this patient.    To contact Stroke Continuity provider, please refer to [email or web address] After hours, contact General Neurology

## 2017-09-08 NOTE — Evaluation (Signed)
Speech Language Pathology Evaluation Patient Details Name: Dillon Garcia MRN: [id number] DOB: [birth year] Today's Date: 09/08/2017 Time: 0321-2248 SLP Time Calculation (min) (ACUTE ONLY): 30 min  Problem List:  Patient Active Problem List   Diagnosis Date Noted  . Aspiration into airway   . Benign essential HTN   . Tobacco abuse   . Seizures ([place])   . Tachypnea   . Bradycardia   . Diastolic dysfunction   . Hypernatremia   . Malnutrition of moderate degree 09/07/2017  . Encounter for orogastric (OG) tube placement   . Cerebral edema ([place]) 09/05/2017  . Stroke (cerebrum) ([place]) 09/04/2017  . Acute ischemic stroke ([place]) 09/04/2017   Past Medical History:  Past Medical History:  Diagnosis Date  . GERD (gastroesophageal reflux disease)   . Heart disease   . HTN (hypertension)   . Hyperlipidemia   . Hypothyroid   . Infectious endocarditis 2012  . Mitral valve disease 2012   Past Surgical History:  Past Surgical History:  Procedure Laterality Date  . MEDIAN STERNOTOMY     CABG? valve replacement?  Marland Kitchen MITRAL VALVE REPLACEMENT  2012   HPI:  22 yoM with hx HTN, HLD, CAD, GERD, and active tobacco abuse, presents with an acute CVA.  MRI of brain showed acute bilateral (larger left, smaller right) cerebellar nonhemorrhagic infarcts, left pontine infarct. Intubated 5/20-5/23.   Assessment / Plan / Recommendation Clinical Impression  Pt presents with motor speech impairments specific to aphonia and dysarthria, such that speech is poorly intelligible at this time.  He is able to follow one and two-step commands, answer yes/no questions reliably, and write with non-dominant hand to express rudimentary needs.  For now, focus needs to be on establishing more reliable method of communication, which will allow [place] to delve more deeply into cognitive-linguistic status.  Agree with recommendations for CIR to maximize communication/swallowing recovery.   Left marker/paper at bedside.  Encourage  large letters and confirm each letter aloud before proceeding to next,      SLP Assessment  SLP Recommendation/Assessment: Patient needs continued Speech Lanaguage Pathology Services SLP Visit Diagnosis: Dysphagia, oropharyngeal phase (R13.12)    Follow Up Recommendations  Inpatient Rehab    Frequency and Duration min 3x week  2 weeks      SLP Evaluation Cognition  Overall Cognitive Status: Impaired/Different from baseline Arousal/Alertness: Awake/alert Orientation Level: Oriented to person;Oriented to place Attention: Sustained Sustained Attention: Appears intact Problem Solving: Impaired       Comprehension  Auditory Comprehension Overall Auditory Comprehension: Impaired Yes/No Questions: Within Functional Limits Commands: Impaired Multistep Basic Commands: 50-74% accurate Conversation: Simple    Expression Expression Primary Mode of Expression: Nonverbal - written Verbal Expression Overall Verbal Expression: Impaired Initiation: No impairment Automatic Speech: Name;Counting(1-3 aphonic) Level of Generative/Spontaneous Verbalization: Phrase Written Expression Dominant Hand: Right Written Expression: Exceptions to [facility] Self Formulation Ability: Word   Oral / Motor  Oral Motor/Sensory Function Overall Oral Motor/Sensory Function: Severe impairment Facial ROM: Reduced right;Suspected CN VII (facial) dysfunction Facial Symmetry: Abnormal symmetry right;Suspected CN VII (facial) dysfunction Facial Strength: Reduced right;Suspected CN VII (facial) dysfunction Facial Sensation: Reduced right;Suspected CN V (Trigeminal) dysfunction Lingual ROM: Reduced right;Suspected CN XII (hypoglossal) dysfunction Lingual Symmetry: Abnormal symmetry right;Suspected CN XII (hypoglossal) dysfunction Lingual Strength: Reduced Motor Speech Overall Motor Speech: Impaired Phonation: Aphonic Articulation: Impaired Level of Impairment: Word Intelligibility: Intelligibility reduced Word:  25-49% accurate Phrase: 25-49% accurate Sentence: 0-24% accurate Conversation: 0-24% accurate   GO  [name] 09/08/2017, 3:48 PM

## 2017-09-09 LAB — GLUCOSE, CAPILLARY
GLUCOSE-CAPILLARY: 127 mg/dL — AB (ref 65–99)
GLUCOSE-CAPILLARY: 140 mg/dL — AB (ref 65–99)
Glucose-Capillary: 156 mg/dL — ABNORMAL HIGH (ref 65–99)
Glucose-Capillary: 143 mg/dL — ABNORMAL HIGH (ref 65–99)
Glucose-Capillary: 147 mg/dL — ABNORMAL HIGH (ref 65–99)
Glucose-Capillary: 136 mg/dL — ABNORMAL HIGH (ref 65–99)

## 2017-09-09 LAB — CBC WITH DIFFERENTIAL/PLATELET
ABS IMMATURE GRANULOCYTES: 0 10*3/uL (ref 0.0–0.1)
BASOS ABS: 0 10*3/uL (ref 0.0–0.1)
BASOS PCT: 0 %
EOS ABS: 0.2 10*3/uL (ref 0.0–0.7)
EOS PCT: 3 %
HCT: 47.2 % (ref 39.0–52.0)
Hemoglobin: 15.2 g/dL (ref 13.0–17.0)
Immature Granulocytes: 0 %
Lymphocytes Relative: 17 %
Lymphs Abs: 1.4 10*3/uL (ref 0.7–4.0)
MCH: 29.2 pg (ref 26.0–34.0)
MCHC: 32.2 g/dL (ref 30.0–36.0)
MCV: 90.6 fL (ref 78.0–100.0)
MONO ABS: 1 10*3/uL (ref 0.1–1.0)
MONOS PCT: 12 %
NEUTROS ABS: 6 10*3/uL (ref 1.7–7.7)
Neutrophils Relative %: 68 %
PLATELETS: 154 10*3/uL (ref 150–400)
RBC: 5.21 MIL/uL (ref 4.22–5.81)
RDW: 13.9 % (ref 11.5–15.5)
WBC: 8.7 10*3/uL (ref 4.0–10.5)

## 2017-09-09 LAB — BASIC METABOLIC PANEL
ANION GAP: 9 (ref 5–15)
BUN: 18 mg/dL (ref 6–20)
CALCIUM: 9 mg/dL (ref 8.9–10.3)
CO2: 27 mmol/L (ref 22–32)
CREATININE: 1.02 mg/dL (ref 0.61–1.24)
Chloride: 112 mmol/L — ABNORMAL HIGH (ref 101–111)
GFR calc Af Amer: >60 mL/min (ref >60)
Glucose, Bld: 155 mg/dL — ABNORMAL HIGH (ref 65–99)
Potassium: 3.1 mmol/L — ABNORMAL LOW (ref 3.5–5.1)
Sodium: 148 mmol/L — ABNORMAL HIGH (ref 135–145)

## 2017-09-09 LAB — SODIUM: Sodium: 149 mmol/L — ABNORMAL HIGH (ref 135–145)

## 2017-09-09 MED ORDER — GUAIFENESIN ER 600 MG PO TB12
600.0000 mg | ORAL_TABLET | Freq: Two times a day (BID) | ORAL | Status: DC
  Administered 2017-09-09: 600 mg via ORAL
  Filled 2017-09-09 (×2): qty 1

## 2017-09-09 NOTE — Progress Notes (Signed)
PULMONARY / CRITICAL CARE MEDICINE   Name: Dillon Garcia MRN: [id number] DOB: Nov 07, 1955    ADMISSION DATE:  09/04/2017  CHIEF COMPLAINT:  Found unresponsive  HISTORY OF PRESENT ILLNESS:    62yoM with hx HTN, HLD, CAD, GERD, and Active tobacco abuse, presents with an acute cerebellar CVA. Reportedly he was found unresponsive in his car outside [place] with questionable seizure activity. He was taken initially to [place] where he was seen to have right right facial droop, and Head CT showed an acute left cerebellar infarct. He was initially following simple commands, but his mental status then began to decline. He received a bolus of mannitol on arrival here and was started on 3% saline which has subsequently ben discontinued. Extubated on 5/23.  PAST MEDICAL HISTORY :  He  has a past medical history of GERD (gastroesophageal reflux disease), Heart disease, HTN (hypertension), Hyperlipidemia, Hypothyroid, Infectious endocarditis (2012), and Mitral valve disease (2012).  PAST SURGICAL HISTORY: He  has a past surgical history that includes Median sternotomy and Mitral valve replacement (2012).  No Known Allergies  No current facility-administered medications on file prior to encounter.    Current Outpatient Medications on File Prior to Encounter  Medication Sig  . amLODipine (NORVASC) 5 MG tablet Take 10 mg by mouth daily.   Marland Kitchen aspirin EC 81 MG tablet Take 81 mg by mouth daily.  . famotidine (PEPCID) 10 MG tablet Take 10 mg by mouth at bedtime as needed for heartburn or indigestion.  Marland Kitchen lisinopril-hydrochlorothiazide (PRINZIDE,ZESTORETIC) 20-12.5 MG tablet Take 2 tablets by mouth daily.   . metoprolol succinate (TOPROL-XL) 100 MG 24 hr tablet Take 100 mg by mouth daily. Take with or immediately following a meal.    FAMILY HISTORY:  His has no family status information on file.    SOCIAL HISTORY: He  reports that he has been smoking.  He uses smokeless tobacco.  REVIEW OF  SYSTEMS:   Unobtainable as patient is nonverbal  SUBJECTIVE:  No acute events.  VITAL SIGNS: BP 124/88   Pulse 88   Temp 98.3 F (36.8 C) (Oral)   Resp (!) 24   Ht 5\' 8"  (1.727 m) Comment: per pt report  Wt 120 lb 5.9 oz (54.6 kg)   SpO2 99%   BMI 18.30 kg/m   HEMODYNAMICS:    VENTILATOR SETTINGS:    INTAKE / OUTPUT: I/O last 3 completed shifts: In: 2642 [I.V.:1930; NG/GT:712] Out: 2205 [Urine:2205]  PHYSICAL EXAMINATION: Gen:      No acute distress HEENT:  EOMI, sclera anicteric Neck:     No masses; no thyromegaly Lungs:    Clear to auscultation bilaterally; normal respiratory effort CV:         Regular rate and rhythm; no murmurs Abd:      + bowel sounds; soft, non-tender; no palpable masses, no distension Ext:    No edema; adequate peripheral perfusion Skin:      Warm and dry; no rash Neuro: Nonverbal, follows commands.  LABS:  BMET Recent Labs  Lab 09/07/17 0516  09/08/17 0415  09/08/17 2110 09/09/17 0524 09/09/17 0906  NA 153*   < > 153*   < > 150* 148* 149*  K 3.3*  --  3.5  --   --  3.1*  --   CL 116*  --  114*  --   --  112*  --   CO2 29  --  29  --   --  27  --   BUN  10  --  8  --   --  18  --   CREATININE 1.06  --  1.02  --   --  1.02  --   GLUCOSE 130*  --  95  --   --  155*  --    < > = values in this interval not displayed.    Electrolytes Recent Labs  Lab 09/07/17 0516 09/07/17 1620 09/08/17 0415 09/08/17 0923 09/08/17 1700 09/09/17 0524  CALCIUM 9.1  --  9.4  --   --  9.0  MG 2.0 2.0  --  1.7 1.6*  --   PHOS 2.1* 3.2  --  4.1 4.0  --     CBC Recent Labs  Lab 09/07/17 0516 09/08/17 0415 09/09/17 0524  WBC 10.7* 10.4 8.7  HGB 13.2 14.6 15.2  HCT 41.7 46.2 47.2  PLT 129* 151 154    Coag's Recent Labs  Lab 09/04/17 0524  APTT 24  INR 0.94    Sepsis Markers Recent Labs  Lab 09/04/17 0602 09/04/17 0935 09/04/17 2118 09/04/17 2314  LATICACIDVEN 1.5 1.5  --  1.0  PROCALCITON  --   --  <0.10  --      ABG Recent Labs  Lab 09/04/17 2247 09/05/17 0300 09/07/17 0400  PHART 7.389 7.418 7.456*  PCO2ART 40.7 39.0 39.3  PO2ART 108.0 104 98.3    Liver Enzymes Recent Labs  Lab 09/04/17 0524 09/04/17 2118  AST 36 44*  ALT 24 24  ALKPHOS 60 59  BILITOT 1.3* 1.1  ALBUMIN 4.1 3.5    Cardiac Enzymes Recent Labs  Lab 09/04/17 0524 09/04/17 2118  TROPONINI <0.03 <0.03    Glucose Recent Labs  Lab 09/08/17 1609 09/08/17 1957 09/08/17 2329 09/09/17 0420 09/09/17 0859 09/09/17 1227  GLUCAP 75 99 153* 156* 143* 140*    Imaging No results found.  STUDIES: Chest x-ray 09/07/2017- ET tube in good position.  Lungs are clear.  No acute abnormality I have reviewed the images personally.  CULTURES: Sputum of 5/21 is growing moderate Haemophilus which is a beta-lactamase producer ANTIBIOTICS: Zosyn started 5/20 for possible aspiration  SIGNIFICANT EVENTS:  LINES/TUBES: Right IJ CVP line placed 5/20  DISCUSSION: This is a 62 year old with a history of MVR and presumed CAD as well as active smoking who was found with altered level of consciousness.  CT scan has shown a large left cerebellar infarct with pontine involvement.  ASSESSMENT / PLAN:  PULMONARY Respiratory failure secondary to CVA Stable post extubation. Continues to have secretions Add mucinex Chest PT, NT suctioning.  CARDIOVASCULAR Mitral valve replacement, hyperlipidemia EF 39-03%, grade 1 diastolic dysfunction on echo Continue telemetry monitoring. Continue Lipitor, aspirin Off Plavix since yesterday.  RENAL Stable.  Monitor urine output and creatinine  GASTROINTESTINAL Protonix prophylaxis  HEMATOLOGIC SCDs  ENDOCRINE Hyperglycemia Continue SSI coverage  NEUROLOGIC Large left cerebellar infarct Management per neurology.  The patient is critically ill with multiple organ system failure and requires high complexity decision making for assessment and support, frequent evaluation  and titration of therapies, advanced monitoring, review of radiographic studies and interpretation of complex data.   Critical Care Time devoted to patient care services, exclusive of separately billable procedures, described in this note is 35 minutes.   Marshell Garfinkel MD [facility] Pager [phone] If no answer or after 3pm call: [phone] 09/09/2017, 4:40 PM

## 2017-09-09 NOTE — Progress Notes (Signed)
STROKE TEAM PROGRESS NOTE   SUBJECTIVE (INTERVAL HISTORY) His RN is at the bedside. Extubated yesterday, sats and VS good but still with significant secretions requiring frequent suctioning. For therapy evals today. Concern he will fail swallow.   CT read as hmg. Questionable hmg vs slow flow in the cortical vein. Will stop plavix for now and repeat CT Monday.  Patient reports the day of the stroke he went shopping, he never got out of the car to shop because he couldn't. He waved for help. Reports he exercises routinely. No hx atrial fibrillation.  OBJECTIVE Temp:  [97.4 F (36.3 C)-98.5 F (36.9 C)] 98.3 F (36.8 C) (05/25 1227) Pulse Rate:  [80-101] 88 (05/25 1300) Cardiac Rhythm: Normal sinus rhythm (05/25 1200) Resp:  [19-28] 24 (05/25 1300) BP: (106-154)/(83-122) 124/88 (05/25 1300) SpO2:  [96 %-100 %] 99 % (05/25 1300) Weight:  [120 lb 5.9 oz (54.6 kg)] 120 lb 5.9 oz (54.6 kg) (05/25 0500)  Recent Labs  Lab 09/08/17 1957 09/08/17 2329 09/09/17 0420 09/09/17 0859 09/09/17 1227  GLUCAP 99 153* 156* 143* 140*   Recent Labs  Lab 09/05/17 0300  09/06/17 0301  09/07/17 0516  09/07/17 1620  09/08/17 0415 09/08/17 0917 09/08/17 0923 09/08/17 1700 09/08/17 2110 09/09/17 0524 09/09/17 0906  NA 148*   < > 160*   < > 153*   < > 153*   < > 153* 151*  --   --  150* 148* 149*  K 4.8  --  3.9  --  3.3*  --   --   --  3.5  --   --   --   --  3.1*  --   CL 115*  --  127*  --  116*  --   --   --  114*  --   --   --   --  112*  --   CO2 26  --  24  --  29  --   --   --  29  --   --   --   --  27  --   GLUCOSE 113*  --  106*  --  130*  --   --   --  95  --   --   --   --  155*  --   BUN 17  --  13  --  10  --   --   --  8  --   --   --   --  18  --   CREATININE 1.40*  --  1.26*  --  1.06  --   --   --  1.02  --   --   --   --  1.02  --   CALCIUM 8.4*  --  8.5*  --  9.1  --   --   --  9.4  --   --   --   --  9.0  --   MG 2.6*  --   --   --  2.0  --  2.0  --   --   --  1.7 1.6*  --    --   --   PHOS 2.9  --   --   --  2.1*  --  3.2  --   --   --  4.1 4.0  --   --   --    < > = values in this interval not displayed.   Recent Labs  Lab 09/04/17 0524 09/04/17 2118  AST 36 44*  ALT 24 24  ALKPHOS 60 59  BILITOT 1.3* 1.1  PROT 6.9 5.8*  ALBUMIN 4.1 3.5   Recent Labs  Lab 09/04/17 0524 09/04/17 2118 09/05/17 0300 09/06/17 0301 09/07/17 0516 09/08/17 0415 09/09/17 0524  WBC 13.7* 14.6* 13.0* 10.7* 10.7* 10.4 8.7  NEUTROABS 11.0* 11.5*  --   --  7.4  --  6.0  HGB 16.9 15.5 14.5 13.4 13.2 14.6 15.2  HCT 50.2 46.5 44.2 42.4 41.7 46.2 47.2  MCV 89.2 88.7 89.3 93.6 93.1 93.1 90.6  PLT 162 156 139* 129* 129* 151 154   Recent Labs  Lab 09/04/17 0524 09/04/17 2118  TROPONINI <0.03 <0.03   No results for input(s): LABPROT, INR in the last 72 hours. No results for input(s): COLORURINE, LABSPEC, [place], GLUCOSEU, HGBUR, BILIRUBINUR, KETONESUR, PROTEINUR, UROBILINOGEN, NITRITE, LEUKOCYTESUR in the last 72 hours.  Invalid input(s): APPERANCEUR     Component Value Date/Time   CHOL 79 09/05/2017 0300   TRIG 102 09/05/2017 0300   HDL 42 09/05/2017 0300   CHOLHDL 1.9 09/05/2017 0300   VLDL 20 09/05/2017 0300   LDLCALC 17 09/05/2017 0300   Lab Results  Component Value Date   HGBA1C 5.5 09/05/2017      Component Value Date/Time   LABOPIA NONE DETECTED 09/04/2017 2314   COCAINSCRNUR NONE DETECTED 09/04/2017 2314   COCAINSCRNUR NONE DETECTED 09/04/2017 0602   LABBENZ POSITIVE (A) 09/04/2017 2314   AMPHETMU NONE DETECTED 09/04/2017 2314   THCU NONE DETECTED 09/04/2017 2314   LABBARB NONE DETECTED 09/04/2017 2314    Recent Labs  Lab 09/04/17 0524  ETH <10    IMAGING  Ct Head Wo Contrast 09/04/2017 IMPRESSION:  1. Evolving acute ischemic infarct involving the superior left cerebellar hemisphere, left superior cerebellar artery territory. No associated hemorrhage or significant mass effect at this time.  2. Age-related cerebral atrophy with mild  chronic small vessel ischemic disease.    Mr Brain Wo Contrast 09/04/2017 IMPRESSION:  MRI HEAD:  1. Motion degraded examination.  2. 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 Regional mass effect without obstructive hydrocephalus.   MRA HEAD:  1. Slow flow versus occluded LEFT vertebral artery.  2. No flow limiting stenosis on this motion degraded examination.    Ct Head Wo Contrast 09/05/2017 IMPRESSION:  1. Continued interval evolution of acute ischemic left greater than right pontocerebellar infarcts, with slightly increased localized edema as compared to previous. Adjacent fourth ventricle remains patent without hydrocephalus. No evidence for hemorrhagic transformation or other complication.  2. No other new acute intracranial abnormality.   Ct Angio Neck W Or Wo Contrast 09/05/2017 IMPRESSION:  1. 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  2. Separate segment of left vertebral artery origin occlusion/near occlusion.  3. Patent distal left V2, V3, and V4 with hypoenhancement, probably representing retrograde flow.  4. Patent right vertebral and bilateral carotid systems without significant stenosis by NASCET criteria.    Ct Head Wo Contrast 09/06/2017 IMPRESSION:  1. Evolving acute LEFT greater than RIGHT cerebellar and LEFT pontine nonhemorrhagic infarcts. Similar cytotoxic edema with mild mass effect on fourth ventricle. No hydrocephalus.    Ct Head Wo Contrast 09/08/2017 IMPRESSION:  1. Evolving acute LEFT greater than RIGHT cerebellar and LEFT pontine nonhemorrhagic infarcts. Mild mass effect on fourth ventricle without hydrocephalus.  2. New  small volume LEFT subarachnoid hemorrhage versus thrombosed cortical vein.                 Dg Abd Portable 1v 09/04/2017 IMPRESSION:  1.  Apparent temperature probe overlies the inferior anatomic pelvis and appears to contain metallic wire.  2. Otherwise no unexpected metallic foreign body over the abdomen or pelvis.   Dg Pelvis Portable 09/04/2017 IMPRESSION:  Negative exam for metal in the pelvis.   Dg Abd Portable 1v 09/06/2017 IMPRESSION: OG tube tip in the fundus of the stomach.  Dg Abd Portable 1v 09/06/2017 IMPRESSION:  NG tip in the region of the gastric antrum.     Dg Chest Port 1 View 09/04/2017 IMPRESSION:  Endotracheal tube tip projects 6.4 cm above the carina, consider 1-2 cm advancement. Nasogastric tube past proximal stomach, gas distended stomach. Mild bronchitic changes. Aortic Atherosclerosis   Dg Chest Port 1 View 09/04/2017 IMPRESSION:  1. Endotracheal tube tip just above the clavicular heads 7.4 cm from the carina, advancement of 1-2 cm would lead to optimal placement.  2. Patchy left infrahilar opacities likely atelectasis, partially obscured by overlying monitoring device.   Dg Chest Port 1 View 09/04/2017 IMPRESSION:  1. Right IJ central venous catheter tip overlies the SVC. Negative for pneumothorax.  2. Endotracheal tube tip about 4.1 cm superior to carina  3. Clear lung fields   Dg Chest Port 1 View 09/05/2017 IMPRESSION:  Stable.  No acute cardiopulmonary findings.   Dg Chest Port 1 View 09/06/2017 IMPRESSION:  1. Endotracheal tube, enteric tube, and right central line in appropriate position.  2. Otherwise unchanged appearance of the chest without acute abnormality.   Dg Chest Port 1 View 09/07/2017 IMPRESSION: Tube and catheter positions as described without pneumothorax. No edema or consolidation. There is aortic atherosclerosis. Aortic Atherosclerosis (ICD10-I70.0).     TTE - Left ventricle: The cavity size was normal. Wall thickness was normal. Systolic function was vigorous. The estimated ejection fraction was in the range of 65% to 70%. Wall motion was normal; there were  no regional wall motion abnormalities. Doppler parameters are consistent with abnormal left ventricular relaxation (grade 1 diastolic dysfunction). - Mitral valve: A bioprosthesis was present and functioning normally. Mean gradient (D): 3 mm Hg. Valve area by continuity equation (using LVOT flow): 1.88 cm^2. Impressions:  No cardiac source of emboli was indentified.  LE venous doppler  Right: There is no evidence of deep vein thrombosis in the lower extremity.There is no evidence of superficial venous thrombosis. No cystic structure found in the popliteal fossa. Left: There is no evidence of deep vein thrombosis in the lower extremity.There is no evidence of superficial venous thrombosis. No cystic structure found in the popliteal fossa.  EEG The EEG is normal with patient recording awake and drowsy state only.  TEE pending -Tues   PHYSICAL EXAM  General - Well nourished, well developed, extubated.  Cardiovascular - Regular rate and rhythm.  Neuro - awake, alert, oriented to person, place and situation. Follows all simple commands. PERRL, both eyes left gaze palsy, will cross midline with effort, b/l eye right, up and downward gaze with nystagmus. Double vision. Can count fingers all fields but decreased L peripheral vision. Significant right facial droop. Significant dysarthria with lots of secretions. Tongue midline in mouth. No aphasia. LUE 4/5, LLE 4+/5 at least. No babinski. RUE 0/5 and RLE 3-/5, with positive babinski. DTR 1+. Sensation intact bilaterally. Decreased coordination left. gait not tested.   ASSESSMENT/PLAN Dillon Garcia is  a 62 y.o. male with history of HTN and HLD admitted for unresponsive in car with questionable shaking episode, concerning for seizure. Also found to have right facial droop and was intubated for airway protection. No tPA given due to OSW.   Stroke:  left SCA and left pontine large infarcts as well as punctate right PICA infarct, embolic, source  unclear, left VA dissection/occlusion vs. cardioembolic  Resultant intubated, eye movement difficulty, right facial droop, right hemiplegia  MRI left SCA and left pontine large infarcts as well as punctate right PICA infarct  MRA  Left VA slow flow vs. Occlusion  EEG - normal  CTA neck left V4 and VA origin occlusion  CT repeat 5/24 evolving L>R cerebellar and L pontine infarcts. Mild mass effect 4th vent but no hydrocephalus. New sm L SAH vs thrombosed vein. Given questionable hmg vs slow flow cortical vein, will hold plavix for now and repeat CT Monday.  2D Echo EF 65-70%  LE venous doppler no DVT  LDL 17  HgbA1c 5.5  NPO. Place [place] for feeding  Consider TEE and loop once medically more stabilized - will tentative place on schedule for Tues. Let Dillon Garcia know if pt not stable on Monday for TEE Tues.  Heparin subq for VTE prophylaxis  aspirin 81 mg daily prior to admission, now on aspirin 325 mg daily and clopidogrel 75 mg daily. Given questionable hmg will hold plavix for now, continue aspirin and repeat CT Monday. If no hmg Monday, resume plavix.  Ongoing aggressive stroke risk factor management  Therapy recommendations:  Pending. For evals today  Disposition:  Pending  Cerebellar edema  MRI showed large left cerebellar infarct  Repeat CT stable no hydrocephalus  Repeat CT no hydrocephalus  Off 3% saline due to high Na -> 1/2NS -> NS  Na 144->158->161->153->153->149  Na Q12h, goal 150-155  Respiratory distress  Extubated.  Lots of secretions  CCM on board  Keep in ICU for moniotring  Seizure-like activity  Doubt the "shaking" saw by EMS was seizure  Could be due to limb posturing due to brain stem infarct  EEG normal   No need AED this time  Hypertension Stable Keep SBP goal < 180   Long term BP goal normotensive  Hyperlipidemia  Home meds:  lipitor 40   LDL 17, goal < 70  Now lipitor down to 10 mg  Continue lipitor 10 on  discharge  Other Stroke Risk Factors  Advanced age  Other Active Problems  Leukocytosis WBC 14.6->13.0->10.7->10.7->10.4->8.7  AKI, resolved - Cre 1.40->1.26->1.06->1.02  Hospital day # [facility]  To contact Stroke Continuity provider, please refer to [email or web address] After hours, contact General Neurology

## 2017-09-10 DIAGNOSIS — I639 Cerebral infarction, unspecified: Secondary | ICD-10-CM

## 2017-09-10 LAB — GLUCOSE, CAPILLARY
GLUCOSE-CAPILLARY: 137 mg/dL — AB (ref 65–99)
GLUCOSE-CAPILLARY: 143 mg/dL — AB (ref 65–99)
Glucose-Capillary: 114 mg/dL — ABNORMAL HIGH (ref 65–99)
Glucose-Capillary: 147 mg/dL — ABNORMAL HIGH (ref 65–99)
Glucose-Capillary: 100 mg/dL — ABNORMAL HIGH (ref 65–99)
Glucose-Capillary: 115 mg/dL — ABNORMAL HIGH (ref 65–99)

## 2017-09-10 LAB — CBC
HCT: 44.4 % (ref 39.0–52.0)
HEMOGLOBIN: 14 g/dL (ref 13.0–17.0)
MCH: 28.6 pg (ref 26.0–34.0)
MCHC: 31.5 g/dL (ref 30.0–36.0)
MCV: 90.6 fL (ref 78.0–100.0)
Platelets: 160 10*3/uL (ref 150–400)
RBC: 4.9 MIL/uL (ref 4.22–5.81)
RDW: 14.1 % (ref 11.5–15.5)
WBC: 7.8 10*3/uL (ref 4.0–10.5)

## 2017-09-10 LAB — BASIC METABOLIC PANEL
Anion gap: 6 (ref 5–15)
BUN: 14 mg/dL (ref 6–20)
CHLORIDE: 116 mmol/L — AB (ref 101–111)
CO2: 29 mmol/L (ref 22–32)
CREATININE: 0.94 mg/dL (ref 0.61–1.24)
Calcium: 8.9 mg/dL (ref 8.9–10.3)
GFR calc Af Amer: >60 mL/min (ref >60)
GFR calc non Af Amer: >60 mL/min (ref >60)
Glucose, Bld: 94 mg/dL (ref 65–99)
Potassium: 3.3 mmol/L — ABNORMAL LOW (ref 3.5–5.1)
SODIUM: 151 mmol/L — AB (ref 135–145)

## 2017-09-10 MED ORDER — GUAIFENESIN 100 MG/5ML PO SOLN
10.0000 mL | ORAL | Status: DC
  Administered 2017-09-10 – 2017-09-13 (×17): 200 mg
  Filled 2017-09-10: qty 10
  Filled 2017-09-10: qty 5
  Filled 2017-09-10 (×15): qty 10

## 2017-09-10 MED ORDER — POTASSIUM CHLORIDE 20 MEQ/15ML (10%) PO SOLN
40.0000 meq | Freq: Two times a day (BID) | ORAL | Status: AC
  Administered 2017-09-10 (×2): 40 meq via ORAL
  Filled 2017-09-10 (×2): qty 30

## 2017-09-10 NOTE — Progress Notes (Signed)
CPT not done  ptl up in chair

## 2017-09-10 NOTE — Progress Notes (Signed)
PULMONARY / CRITICAL CARE MEDICINE   Name: Dillon Garcia MRN: [id number] DOB: 1955/07/25    ADMISSION DATE:  09/04/2017  CHIEF COMPLAINT:  Found unresponsive  HISTORY OF PRESENT ILLNESS:    24yoM with hx HTN, HLD, CAD, GERD, and Active tobacco abuse, presents with an acute cerebellar CVA. Reportedly he was found unresponsive in his car outside [place] with questionable seizure activity. He was taken initially to [place] where he was seen to have right right facial droop, and Head CT showed an acute left cerebellar infarct. He was initially following simple commands, but his mental status then began to decline. He received a bolus of mannitol on arrival here and was started on 3% saline which has subsequently been discontinued. Extubated on 5/23.  SUBJECTIVE:  No acute events overnight Weak cough  VITAL SIGNS: BP (!) 154/90   Pulse 80   Temp 98.1 F (36.7 C) (Axillary)   Resp (!) 25   Ht 5\' 8"  (1.727 m) Comment: per pt report  Wt 126 lb 1.7 oz (57.2 kg)   SpO2 96%   BMI 19.17 kg/m   HEMODYNAMICS:    VENTILATOR SETTINGS:    INTAKE / OUTPUT: I/O last 3 completed shifts: In: 3962 [I.V.:1810; NG/GT:2152] Out: 1235 [Urine:1235]  PHYSICAL EXAMINATION: Gen:      No acute distress, awake and alert, following commands HEENT:  EOMI, sclera anicteric, R facial droop noted,aphonia and dysarthria Neck:     No masses; no thyromegaly, No LAD Lungs:    Bilateral chest excursion, RR is 25, Scattered rhonchi, weak cough CV:         S1, S2, RRR, No RMG, No JVD Abd:      + bowel sounds; soft, non-tender; no palpable masses, no distension, Non-obese Ext:    No edema; adequate peripheral perfusion, warm to touch with brisk cap refill Skin:      Warm and dry and intact, ; no rash Neuro:    Weak movement on L,, R sided hemiparesis, following commands, attempting to speak  LABS:  BMET Recent Labs  Lab 09/08/17 0415  09/09/17 0524 09/09/17 0906 09/10/17 0445  NA 153*   < >  148* 149* 151*  K 3.5  --  3.1*  --  3.3*  CL 114*  --  112*  --  116*  CO2 29  --  27  --  29  BUN 8  --  18  --  14  CREATININE 1.02  --  1.02  --  0.94  GLUCOSE 95  --  155*  --  94   < > = values in this interval not displayed.    Electrolytes Recent Labs  Lab 09/07/17 1620 09/08/17 0415 09/08/17 0923 09/08/17 1700 09/09/17 0524 09/10/17 0445  CALCIUM  --  9.4  --   --  9.0 8.9  MG 2.0  --  1.7 1.6*  --   --   PHOS 3.2  --  4.1 4.0  --   --     CBC Recent Labs  Lab 09/08/17 0415 09/09/17 0524 09/10/17 0445  WBC 10.4 8.7 7.8  HGB 14.6 15.2 14.0  HCT 46.2 47.2 44.4  PLT 151 154 160    Coag's Recent Labs  Lab 09/04/17 0524  APTT 24  INR 0.94    Sepsis Markers Recent Labs  Lab 09/04/17 0602 09/04/17 0935 09/04/17 2118 09/04/17 2314  LATICACIDVEN 1.5 1.5  --  1.0  PROCALCITON  --   --  <0.10  --  ABG Recent Labs  Lab 09/04/17 2247 09/05/17 0300 09/07/17 0400  PHART 7.389 7.418 7.456*  PCO2ART 40.7 39.0 39.3  PO2ART 108.0 104 98.3    Liver Enzymes Recent Labs  Lab 09/04/17 0524 09/04/17 2118  AST 36 44*  ALT 24 24  ALKPHOS 60 59  BILITOT 1.3* 1.1  ALBUMIN 4.1 3.5    Cardiac Enzymes Recent Labs  Lab 09/04/17 0524 09/04/17 2118  TROPONINI <0.03 <0.03    Glucose Recent Labs  Lab 09/09/17 1625 09/09/17 2017 09/09/17 2355 09/10/17 0355 09/10/17 0822 09/10/17 1132  GLUCAP 147* 136* 127* 137* 114* 147*    Imaging No results found.  STUDIES: Chest x-ray 09/07/2017- ET tube in good position.  Lungs are clear.  No acute abnormality I have reviewed the images personally.  CULTURES: Sputum of 5/21 is growing moderate Haemophilus which is a beta-lactamase producer ANTIBIOTICS: Zosyn started 5/20 for possible aspiration  SIGNIFICANT EVENTS:  LINES/TUBES: Right IJ CVP line placed 5/20  DISCUSSION: This is a 62 year old with a history of MVR and presumed CAD as well as active smoking who was found with altered level  of consciousness.  CT scan has shown a large left cerebellar infarct with pontine involvement.  ASSESSMENT / PLAN:  PULMONARY Respiratory failure secondary to CVA Stable post extubation. Continues to have copious secretions>> ? Airway protection as cough is weak.>> High risk aspiration Saturations remain 100% on RA Plan: Add mucinex Chest PT, NT suctioning prn. CXR 5/27 am Consider scopolamine patch for secretions if continues to be an issue  CARDIOVASCULAR Mitral valve replacement, hyperlipidemia EF 29-56%, grade 1 diastolic dysfunction on echo Plan Continue telemetry monitoring. Continue Lipitor, aspirin Off Plavix since  5/24  RENAL Hypernatremia>> 151 on 5/26 Hypokalemia >> repleted 5/26 Plan Monitor urine output  Trend BMET Replete electrolytes prn will watch NA for now, if  Continues to climb, consider adding free water   GASTROINTESTINAL NPO failed  Swallow/ weak cough Plan Protonix prophylaxis Tube Feeds Continue Speech Therapy  HEMATOLOGIC No acute issues Plan: SCDs  ENDOCRINE Hyperglycemia Plan: CBG Q 4 Continue SSI coverage  NEUROLOGIC Large left cerebellar infarct Management per neurology.  CC APP time: 58 mnutes  I am concerned about weak cough,  copious secretions and his ability to protect his airway. Currently saturations are 100% on RA. Speech is consulted. Will need careful monitoring.  Magdalen Spatz, AGACNP-BC [facility] Pager # [phone] 09/10/2017, 2:14 PM

## 2017-09-10 NOTE — Progress Notes (Addendum)
STROKE TEAM PROGRESS NOTE   SUBJECTIVE (INTERVAL HISTORY) His RN is at the bedside. Still with secretions. High risk for aspiration.Extubated 5/23.  CT read as hmg. Questionable hmg vs slow flow in the cortical vein. Will stop plavix for now and repeat CT Monday.  Patient reports the day of the stroke he went shopping, he never got out of the car to shop because he couldn't. He waved for help. Reports he exercises routinely. No hx atrial fibrillation.  OBJECTIVE Temp:  [97.9 F (36.6 C)-98.5 F (36.9 C)] 97.9 F (36.6 C) (05/26 0400) Pulse Rate:  [65-96] 80 (05/26 0700) Cardiac Rhythm: Normal sinus rhythm (05/25 2000) Resp:  [14-27] 25 (05/26 0700) BP: (111-154)/(80-96) 154/90 (05/26 0700) SpO2:  [94 %-100 %] 96 % (05/26 0700) Weight:  [126 lb 1.7 oz (57.2 kg)] 126 lb 1.7 oz (57.2 kg) (05/26 0430)  Recent Labs  Lab 09/09/17 1227 09/09/17 1625 09/09/17 2017 09/09/17 2355 09/10/17 0355  GLUCAP 140* 147* 136* 127* 137*   Recent Labs  Lab 09/05/17 0300  09/06/17 0301  09/07/17 0516  09/07/17 1620  09/08/17 0415 09/08/17 0917 09/08/17 0923 09/08/17 1700 09/08/17 2110 09/09/17 0524 09/09/17 0906 09/10/17 0445  NA 148*   < > 160*   < > 153*   < > 153*   < > 153* 151*  --   --  150* 148* 149* 151*  K 4.8  --  3.9  --  3.3*  --   --   --  3.5  --   --   --   --  3.1*  --  3.3*  CL 115*  --  127*  --  116*  --   --   --  114*  --   --   --   --  112*  --  116*  CO2 26  --  24  --  29  --   --   --  29  --   --   --   --  27  --  29  GLUCOSE 113*  --  106*  --  130*  --   --   --  95  --   --   --   --  155*  --  94  BUN 17  --  13  --  10  --   --   --  8  --   --   --   --  18  --  14  CREATININE 1.40*  --  1.26*  --  1.06  --   --   --  1.02  --   --   --   --  1.02  --  0.94  CALCIUM 8.4*  --  8.5*  --  9.1  --   --   --  9.4  --   --   --   --  9.0  --  8.9  MG 2.6*  --   --   --  2.0  --  2.0  --   --   --  1.7 1.6*  --   --   --   --   PHOS 2.9  --   --   --  2.1*  --   3.2  --   --   --  4.1 4.0  --   --   --   --    < > = values in this interval not displayed.   Recent Labs  Lab 09/04/17 0524 09/04/17 2118  AST 36 44*  ALT 24 24  ALKPHOS 60 59  BILITOT 1.3* 1.1  PROT 6.9 5.8*  ALBUMIN 4.1 3.5   Recent Labs  Lab 09/04/17 0524 09/04/17 2118  09/06/17 0301 09/07/17 0516 09/08/17 0415 09/09/17 0524 09/10/17 0445  WBC 13.7* 14.6*   < > 10.7* 10.7* 10.4 8.7 7.8  NEUTROABS 11.0* 11.5*  --   --  7.4  --  6.0  --   HGB 16.9 15.5   < > 13.4 13.2 14.6 15.2 14.0  HCT 50.2 46.5   < > 42.4 41.7 46.2 47.2 44.4  MCV 89.2 88.7   < > 93.6 93.1 93.1 90.6 90.6  PLT 162 156   < > 129* 129* 151 154 160   < > = values in this interval not displayed.   Recent Labs  Lab 09/04/17 0524 09/04/17 2118  TROPONINI <0.03 <0.03   No results for input(s): LABPROT, INR in the last 72 hours. No results for input(s): COLORURINE, LABSPEC, [place], GLUCOSEU, HGBUR, BILIRUBINUR, KETONESUR, PROTEINUR, UROBILINOGEN, NITRITE, LEUKOCYTESUR in the last 72 hours.  Invalid input(s): APPERANCEUR     Component Value Date/Time   CHOL 79 09/05/2017 0300   TRIG 102 09/05/2017 0300   HDL 42 09/05/2017 0300   CHOLHDL 1.9 09/05/2017 0300   VLDL 20 09/05/2017 0300   LDLCALC 17 09/05/2017 0300   Lab Results  Component Value Date   HGBA1C 5.5 09/05/2017      Component Value Date/Time   LABOPIA NONE DETECTED 09/04/2017 2314   COCAINSCRNUR NONE DETECTED 09/04/2017 2314   COCAINSCRNUR NONE DETECTED 09/04/2017 0602   LABBENZ POSITIVE (A) 09/04/2017 2314   AMPHETMU NONE DETECTED 09/04/2017 2314   THCU NONE DETECTED 09/04/2017 2314   LABBARB NONE DETECTED 09/04/2017 2314    Recent Labs  Lab 09/04/17 0524  ETH <10    IMAGING  Ct Head Wo Contrast 09/04/2017 IMPRESSION:  1. Evolving acute ischemic infarct involving the superior left cerebellar hemisphere, left superior cerebellar artery territory. No associated hemorrhage or significant mass effect at this time.  2.  Age-related cerebral atrophy with mild chronic small vessel ischemic disease.    Mr Brain Wo Contrast 09/04/2017 IMPRESSION:  MRI HEAD:  1. Motion degraded examination.  2. 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 Regional mass effect without obstructive hydrocephalus.   MRA HEAD:  1. Slow flow versus occluded LEFT vertebral artery.  2. No flow limiting stenosis on this motion degraded examination.    Ct Head Wo Contrast 09/05/2017 IMPRESSION:  1. Continued interval evolution of acute ischemic left greater than right pontocerebellar infarcts, with slightly increased localized edema as compared to previous. Adjacent fourth ventricle remains patent without hydrocephalus. No evidence for hemorrhagic transformation or other complication.  2. No other new acute intracranial abnormality.   Ct Angio Neck W Or Wo Contrast 09/05/2017 IMPRESSION:  1. 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  2. Separate segment of left vertebral artery origin occlusion/near occlusion.  3. Patent distal left V2, V3, and V4 with hypoenhancement, probably representing retrograde flow.  4. Patent right vertebral and bilateral carotid systems without significant stenosis by NASCET criteria.    Ct Head Wo Contrast 09/06/2017 IMPRESSION:  1. Evolving acute LEFT greater than RIGHT cerebellar and LEFT pontine nonhemorrhagic infarcts. Similar cytotoxic edema with mild mass effect on fourth ventricle. No  hydrocephalus.    Ct Head Wo Contrast 09/08/2017 IMPRESSION:  1. Evolving acute LEFT greater than RIGHT cerebellar and LEFT pontine nonhemorrhagic infarcts. Mild mass effect on fourth ventricle without hydrocephalus.  2. New small volume LEFT subarachnoid hemorrhage versus thrombosed cortical vein.                 Dg Abd  Portable 1v 09/04/2017 IMPRESSION:  1. Apparent temperature probe overlies the inferior anatomic pelvis and appears to contain metallic wire.  2. Otherwise no unexpected metallic foreign body over the abdomen or pelvis.   Dg Pelvis Portable 09/04/2017 IMPRESSION:  Negative exam for metal in the pelvis.   Dg Abd Portable 1v 09/06/2017 IMPRESSION: OG tube tip in the fundus of the stomach.  Dg Abd Portable 1v 09/06/2017 IMPRESSION:  NG tip in the region of the gastric antrum.     Dg Chest Port 1 View 09/04/2017 IMPRESSION:  Endotracheal tube tip projects 6.4 cm above the carina, consider 1-2 cm advancement. Nasogastric tube past proximal stomach, gas distended stomach. Mild bronchitic changes. Aortic Atherosclerosis   Dg Chest Port 1 View 09/04/2017 IMPRESSION:  1. Endotracheal tube tip just above the clavicular heads 7.4 cm from the carina, advancement of 1-2 cm would lead to optimal placement.  2. Patchy left infrahilar opacities likely atelectasis, partially obscured by overlying monitoring device.   Dg Chest Port 1 View 09/04/2017 IMPRESSION:  1. Right IJ central venous catheter tip overlies the SVC. Negative for pneumothorax.  2. Endotracheal tube tip about 4.1 cm superior to carina  3. Clear lung fields   Dg Chest Port 1 View 09/05/2017 IMPRESSION:  Stable.  No acute cardiopulmonary findings.   Dg Chest Port 1 View 09/06/2017 IMPRESSION:  1. Endotracheal tube, enteric tube, and right central line in appropriate position.  2. Otherwise unchanged appearance of the chest without acute abnormality.   Dg Chest Port 1 View 09/07/2017 IMPRESSION: Tube and catheter positions as described without pneumothorax. No edema or consolidation. There is aortic atherosclerosis. Aortic Atherosclerosis (ICD10-I70.0).     TTE - Left ventricle: The cavity size was normal. Wall thickness was normal. Systolic function was vigorous. The estimated ejection fraction was in the range of 65% to  70%. Wall motion was normal; there were no regional wall motion abnormalities. Doppler parameters are consistent with abnormal left ventricular relaxation (grade 1 diastolic dysfunction). - Mitral valve: A bioprosthesis was present and functioning normally. Mean gradient (D): 3 mm Hg. Valve area by continuity equation (using LVOT flow): 1.88 cm^2. Impressions:  No cardiac source of emboli was indentified.  LE venous doppler  Right: There is no evidence of deep vein thrombosis in the lower extremity.There is no evidence of superficial venous thrombosis. No cystic structure found in the popliteal fossa. Left: There is no evidence of deep vein thrombosis in the lower extremity.There is no evidence of superficial venous thrombosis. No cystic structure found in the popliteal fossa.  EEG The EEG is normal with patient recording awake and drowsy state only.  TEE pending -Tues   PHYSICAL EXAM  General - Well nourished, well developed, extubated.  Cardiovascular - Regular rate and rhythm.  Neuro - awake, alert, oriented to person, place and situation. Follows all simple commands. PERRL, both eyes left gaze palsy, will cross midline with effort, b/l eye right, up and downward gaze with nystagmus. Double vision. Can count fingers all fields but decreased L peripheral vision. Significant right facial droop. Significant dysarthria with lots of secretions. Tongue midline in mouth. No aphasia.  LUE 4/5, LLE 4+/5 at least. No babinski. RUE 0/5 and RLE 3-/5, with positive babinski. DTR 1+. Sensation intact bilaterally. Decreased coordination left. gait not tested.   ASSESSMENT/PLAN Dillon Garcia is a 62 y.o. male with history of HTN and HLD admitted for unresponsive in car with questionable shaking episode, concerning for seizure. Also found to have right facial droop and was intubated for airway protection. No tPA given due to OSW.   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 cardioembolic  Resultant intubated, eye movement difficulty, right facial droop, right hemiplegia  MRI left SCA and left pontine large infarcts as well as punctate right PICA infarct  MRA  Left VA slow flow vs. Occlusion  EEG - normal  CTA neck left V4 and VA origin occlusion  CT repeat 5/24 evolving L>R cerebellar and L pontine infarcts. Mild mass effect 4th vent but no hydrocephalus. New sm L SAH vs thrombosed vein. Given questionable hmg vs slow flow cortical vein, will hold plavix for now and repeat CT Monday.  2D Echo EF 65-70%  LE venous doppler no DVT  LDL 17  HgbA1c 5.5  NPO. Place [place] for feeding  Consider TEE and loop once medically more stabilized - will tentative place on schedule for Tues. Let Wannetta Sender know if pt not stable on Monday for TEE Tues.  Heparin subq for VTE prophylaxis  aspirin 81 mg daily prior to admission, now on aspirin 325 mg daily and clopidogrel 75 mg daily. Given questionable hmg will hold plavix for now, continue aspirin and repeat CT Monday. If no hmg Monday, resume plavix.  Ongoing aggressive stroke risk factor management  Therapy recommendations:  Pending. For evals today  Disposition:  Pending  Cerebellar edema  MRI showed large left cerebellar infarct  Repeat CT stable no hydrocephalus  Repeat CT no hydrocephalus  Off 3% saline due to high Na   Respiratory distress  Extubated.  Lots of secretions, high aspiration mix  Seizure-like activity  Doubt the "shaking" saw by EMS was seizure  Could be due to limb posturing due to brain stem infarct  EEG normal   No need AED this time  Hypertension Stable Keep SBP goal < 180   Long term BP goal normotensive  Hyperlipidemia  Home meds:  lipitor 40   LDL 17, goal < 70  Now lipitor down to 10 mg  Continue lipitor 10 on discharge  Other Stroke Risk Factors  Advanced age  Other  Active Problems  Leukocytosis resolved  AKI, resolved   High aspiration risk, failed swallow on TFs  CXR 5/27 am pending  Consider scopolamine patch  Off of Plavix since 5/24 repeat CT in am and may restart  Hospital day # 6   Personally  participated in, made any corrections needed, and agree with history, physical, neuro exam,assessment and plan as stated above.     Sarina Ill, MD [facility]    To contact Stroke Continuity provider, please refer to [email or web address] After hours, contact General Neurology

## 2017-09-11 ENCOUNTER — Inpatient Hospital Stay (HOSPITAL_COMMUNITY): Payer: Medicare HMO

## 2017-09-11 LAB — CBC
HCT: 43 % (ref 39.0–52.0)
Hemoglobin: 13.7 g/dL (ref 13.0–17.0)
MCH: 29.1 pg (ref 26.0–34.0)
MCHC: 31.9 g/dL (ref 30.0–36.0)
MCV: 91.3 fL (ref 78.0–100.0)
PLATELETS: 155 10*3/uL (ref 150–400)
RBC: 4.71 MIL/uL (ref 4.22–5.81)
RDW: 14.3 % (ref 11.5–15.5)
WBC: 8.2 10*3/uL (ref 4.0–10.5)

## 2017-09-11 LAB — BASIC METABOLIC PANEL
ANION GAP: 6 (ref 5–15)
BUN: 12 mg/dL (ref 6–20)
CALCIUM: 9.2 mg/dL (ref 8.9–10.3)
CHLORIDE: 117 mmol/L — AB (ref 101–111)
CO2: 26 mmol/L (ref 22–32)
CREATININE: 0.85 mg/dL (ref 0.61–1.24)
GFR calc Af Amer: >60 mL/min (ref >60)
Glucose, Bld: 127 mg/dL — ABNORMAL HIGH (ref 65–99)
POTASSIUM: 3.9 mmol/L (ref 3.5–5.1)
Sodium: 149 mmol/L — ABNORMAL HIGH (ref 135–145)

## 2017-09-11 LAB — GLUCOSE, CAPILLARY
GLUCOSE-CAPILLARY: 124 mg/dL — AB (ref 65–99)
GLUCOSE-CAPILLARY: 98 mg/dL (ref 65–99)
Glucose-Capillary: 103 mg/dL — ABNORMAL HIGH (ref 65–99)
Glucose-Capillary: 125 mg/dL — ABNORMAL HIGH (ref 65–99)
Glucose-Capillary: 100 mg/dL — ABNORMAL HIGH (ref 65–99)

## 2017-09-11 MED ORDER — CHLORHEXIDINE GLUCONATE 0.12 % MT SOLN
OROMUCOSAL | Status: AC
  Filled 2017-09-11: qty 15

## 2017-09-11 NOTE — Progress Notes (Signed)
Occupational Therapy Treatment Patient Details Name: Dillon Garcia MRN: [id number] DOB: 1955/07/10 Today's Date: 09/11/2017    History of present illness 62 yo M with hx HTN, HLD, CAD, GERD, and Active tobacco abuse, presents with an acute CVA.  MRI of brain showed Acute LEFT greater than RIGHT pontocerebellar nonhemorrhagic ast medical history of GERD (gastroesophageal reflux disease), Heart disease, HTN (hypertension), Hyperlipidemia, Hypothyroid, Infectious endocarditis (2012), and Mitral valve disease.  Past surgical history that includes Median sternotomy and Mitral valve replacement.   OT comments  Pt transferred OOB to chair 1 person (A) this session. Recommend OOB for all meal times even though NPO to help with activity tolerance. Pt noted to have large amount of oral secretions from Right side of mouth this session. Pt able to use yonker with L UE to help clear.    Follow Up Recommendations  CIR;Supervision/Assistance - 24 hour    Equipment Recommendations  None recommended by OT    Recommendations for Other Services Rehab consult    Precautions / Restrictions Precautions Precautions: Fall Precaution Comments: right sided deficits, dizziness, double vision.       Mobility Bed Mobility Overal bed mobility: Needs Assistance Bed Mobility: Supine to Sit Rolling: Mod assist   Supine to sit: Mod assist     General bed mobility comments: pt requires (A) to power up from bed surface and pushing with L UE. pt with posterior lean during session requiring max (A) with bil LE blocked to prevent scoot off EOB. pt with cues and incr time able to progress to min (A)   Transfers Overall transfer level: Needs assistance   Transfers: Sit to/from Stand Sit to Stand: Mod assist         General transfer comment: pt with stand pivot to chair with R LE blocked and anterior weight shift with therapist helping guide weight shift    Balance Overall balance assessment: Needs  assistance Sitting-balance support: Single extremity supported;Feet supported Sitting balance-Leahy Scale: Poor Sitting balance - Comments: pushing with L UE at times and posterior lean. pt with trunk rotation attemipting to use L side                                    ADL either performed or assessed with clinical judgement   ADL Overall ADL's : Needs assistance/impaired Eating/Feeding: NPO   Grooming: Wash/dry face;Moderate assistance Grooming Details (indicate cue type and reason): pt able to hold with L hand and needs cues for yonker and wash cloth                  Toilet Transfer: Moderate assistance Toilet Transfer Details (indicate cue type and reason): simulated EOB to chair. pt requires blocking at EOB due to posterior lean attempting to power up . pt able to lock R LE into extension to transfer to chair           General ADL Comments: pt motivated for OOB this session. pt pleasant and smiling during session     Vision   Additional Comments: noted to have nystagmus with eye movement. pt with nystagmus when fixated on object.  glasses noted to be present in room this session   Perception     Praxis      Cognition Arousal/Alertness: Awake/alert Behavior During Therapy: [facility] for tasks assessed/performed   Area of Impairment: Following commands  General Comments: pt giving appropriate thumbs up and down in response to questions to communicate. pt attempting to smile in response to OOB to chair today.         Exercises     Shoulder Instructions       General Comments incr risk for skin break down due to decr mobility. could benfit from frequent transitions/ transters    Pertinent Vitals/ Pain       Pain Assessment: No/denies pain  Home Living                                          Prior Functioning/Environment              Frequency  Min 2X/week        Progress  Toward Goals  OT Goals(current goals can now be found in the care plan section)  Progress towards OT goals: Progressing toward goals  Acute Rehab OT Goals Patient Stated Goal: difficulty speakin, low tone, hushed OT Goal Formulation: Patient unable to participate in goal setting Time For Goal Achievement: 09/19/17 Potential to Achieve Goals: Good ADL Goals Pt Will Perform Grooming: with mod assist;sitting Additional ADL Goal #1: Pt will maintain EOB sitting with mod A x 5 mins during simple grooming tasks Additional ADL Goal #2: Pt will participate in further visual assessment  Plan Discharge plan remains appropriate    Co-evaluation                 AM-PAC PT "6 Clicks" Daily Activity     Outcome Measure   Help from another person eating meals?: Total Help from another person taking care of personal grooming?: A Lot Help from another person toileting, which includes using toliet, bedpan, or urinal?: A Lot Help from another person bathing (including washing, rinsing, drying)?: Total Help from another person to put on and taking off regular upper body clothing?: Total Help from another person to put on and taking off regular lower body clothing?: Total 6 Click Score: 8    End of Session Equipment Utilized During Treatment: Gait belt  OT Visit Diagnosis: Hemiplegia and hemiparesis Hemiplegia - Right/Left: Right Hemiplegia - dominant/non-dominant: Dominant Hemiplegia - caused by: Cerebral infarction   Activity Tolerance Patient tolerated treatment well   Patient Left in chair;with call bell/phone within reach;with nursing/sitter in room   Nurse Communication Mobility status;Precautions        Time: 9201-0071 OT Time Calculation (min): 27 min  Charges: OT General Charges $OT Visit: 1 Visit OT Treatments $Therapeutic Activity: 23-37 mins   Jeri Modena   OTR/L Pager: [phone] Office: [phone] .    Parke Poisson B 09/11/2017, 11:18 AM

## 2017-09-11 NOTE — Progress Notes (Signed)
STROKE TEAM PROGRESS NOTE   SUBJECTIVE (INTERVAL HISTORY) No family members present. Therapist working with pt bedside.Hold off on TEE / Loop for now.  OBJECTIVE Temp:  [97.6 F (36.4 C)-98.8 F (37.1 C)] 98.6 F (37 C) (05/27 0800) Pulse Rate:  [65-89] 78 (05/27 1230) Cardiac Rhythm: Normal sinus rhythm (05/27 0800) Resp:  [14-38] 22 (05/27 1230) BP: (123-159)/(80-116) 139/110 (05/27 1230) SpO2:  [96 %-100 %] 100 % (05/27 1230) Weight:  [129 lb 10.1 oz (58.8 kg)] 129 lb 10.1 oz (58.8 kg) (05/27 0330)  Recent Labs  Lab 09/10/17 1951 09/10/17 2355 09/11/17 0349 09/11/17 0919 09/11/17 1253  GLUCAP 115* 143* 124* 103* 125*   Recent Labs  Lab 09/05/17 0300  09/07/17 0516  09/07/17 1620  09/08/17 0415  09/08/17 0923 09/08/17 1700 09/08/17 2110 09/09/17 0524 09/09/17 0906 09/10/17 0445 09/11/17 0633  NA 148*   < > 153*   < > 153*   < > 153*   < >  --   --  150* 148* 149* 151* 149*  K 4.8   < > 3.3*  --   --   --  3.5  --   --   --   --  3.1*  --  3.3* 3.9  CL 115*   < > 116*  --   --   --  114*  --   --   --   --  112*  --  116* 117*  CO2 26   < > 29  --   --   --  29  --   --   --   --  27  --  29 26  GLUCOSE 113*   < > 130*  --   --   --  95  --   --   --   --  155*  --  94 127*  BUN 17   < > 10  --   --   --  8  --   --   --   --  18  --  14 12  CREATININE 1.40*   < > 1.06  --   --   --  1.02  --   --   --   --  1.02  --  0.94 0.85  CALCIUM 8.4*   < > 9.1  --   --   --  9.4  --   --   --   --  9.0  --  8.9 9.2  MG 2.6*  --  2.0  --  2.0  --   --   --  1.7 1.6*  --   --   --   --   --   PHOS 2.9  --  2.1*  --  3.2  --   --   --  4.1 4.0  --   --   --   --   --    < > = values in this interval not displayed.   Recent Labs  Lab 09/04/17 2118  AST 44*  ALT 24  ALKPHOS 59  BILITOT 1.1  PROT 5.8*  ALBUMIN 3.5   Recent Labs  Lab 09/04/17 2118  09/07/17 0516 09/08/17 0415 09/09/17 0524 09/10/17 0445 09/11/17 0633  WBC 14.6*   < > 10.7* 10.4 8.7 7.8 8.2   NEUTROABS 11.5*  --  7.4  --  6.0  --   --   HGB 15.5   < > 13.2 14.6  15.2 14.0 13.7  HCT 46.5   < > 41.7 46.2 47.2 44.4 43.0  MCV 88.7   < > 93.1 93.1 90.6 90.6 91.3  PLT 156   < > 129* 151 154 160 155   < > = values in this interval not displayed.   Recent Labs  Lab 09/04/17 2118  TROPONINI <0.03   No results for input(s): LABPROT, INR in the last 72 hours. No results for input(s): COLORURINE, LABSPEC, [place], GLUCOSEU, HGBUR, BILIRUBINUR, KETONESUR, PROTEINUR, UROBILINOGEN, NITRITE, LEUKOCYTESUR in the last 72 hours.  Invalid input(s): APPERANCEUR     Component Value Date/Time   CHOL 79 09/05/2017 0300   TRIG 102 09/05/2017 0300   HDL 42 09/05/2017 0300   CHOLHDL 1.9 09/05/2017 0300   VLDL 20 09/05/2017 0300   LDLCALC 17 09/05/2017 0300   Lab Results  Component Value Date   HGBA1C 5.5 09/05/2017      Component Value Date/Time   LABOPIA NONE DETECTED 09/04/2017 2314   COCAINSCRNUR NONE DETECTED 09/04/2017 2314   COCAINSCRNUR NONE DETECTED 09/04/2017 0602   LABBENZ POSITIVE (A) 09/04/2017 2314   AMPHETMU NONE DETECTED 09/04/2017 2314   THCU NONE DETECTED 09/04/2017 2314   LABBARB NONE DETECTED 09/04/2017 2314    No results for input(s): ETH in the last 168 hours.  IMAGING  Ct Head Wo Contrast 09/04/2017 IMPRESSION:  1. Evolving acute ischemic infarct involving the superior left cerebellar hemisphere, left superior cerebellar artery territory. No associated hemorrhage or significant mass effect at this time.  2. Age-related cerebral atrophy with mild chronic small vessel ischemic disease.    Mr Brain Wo Contrast 09/04/2017 IMPRESSION:  MRI HEAD:  1. Motion degraded examination.  2. 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 Regional mass effect without obstructive hydrocephalus.   MRA HEAD:  1. Slow flow versus occluded LEFT vertebral artery.  2. No flow limiting stenosis on this motion degraded  examination.    Ct Head Wo Contrast 09/05/2017 IMPRESSION:  1. Continued interval evolution of acute ischemic left greater than right pontocerebellar infarcts, with slightly increased localized edema as compared to previous. Adjacent fourth ventricle remains patent without hydrocephalus. No evidence for hemorrhagic transformation or other complication.  2. No other new acute intracranial abnormality.   Ct Angio Neck W Or Wo Contrast 09/05/2017 IMPRESSION:  1. 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  2. Separate segment of left vertebral artery origin occlusion/near occlusion.  3. Patent distal left V2, V3, and V4 with hypoenhancement, probably representing retrograde flow.  4. Patent right vertebral and bilateral carotid systems without significant stenosis by NASCET criteria.    Ct Head Wo Contrast 09/06/2017 IMPRESSION:  1. Evolving acute LEFT greater than RIGHT cerebellar and LEFT pontine nonhemorrhagic infarcts. Similar cytotoxic edema with mild mass effect on fourth ventricle. No hydrocephalus.    Ct Head Wo Contrast 09/08/2017 IMPRESSION:  1. Evolving acute LEFT greater than RIGHT cerebellar and LEFT pontine nonhemorrhagic infarcts. Mild mass effect on fourth ventricle without hydrocephalus.  2. New small volume LEFT subarachnoid hemorrhage versus thrombosed cortical vein.    Ct Head Wo Contrast 09/11/2017 IMPRESSION:  1. Unchanged appearance of cerebellar and pontine infarcts with persistent posterior fossa mass effect, but no obstructive hydrocephalus. 2. Unchanged small focus of subarachnoid hemorrhage over the parafalcine left convexity.     Dg Abd Portable 1v 09/04/2017 IMPRESSION:  1. Apparent temperature  probe overlies the inferior anatomic pelvis and appears to contain metallic wire.  2. Otherwise no unexpected metallic foreign body over the  abdomen or pelvis.   Dg Pelvis Portable 09/04/2017 IMPRESSION:  Negative exam for metal in the pelvis.   Dg Abd Portable 1v 09/06/2017 IMPRESSION: OG tube tip in the fundus of the stomach.  Dg Abd Portable 1v 09/06/2017 IMPRESSION:  NG tip in the region of the gastric antrum.     Dg Chest Port 1 View 09/04/2017 IMPRESSION:  Endotracheal tube tip projects 6.4 cm above the carina, consider 1-2 cm advancement. Nasogastric tube past proximal stomach, gas distended stomach. Mild bronchitic changes. Aortic Atherosclerosis   Dg Chest Port 1 View 09/04/2017 IMPRESSION:  1. Endotracheal tube tip just above the clavicular heads 7.4 cm from the carina, advancement of 1-2 cm would lead to optimal placement.  2. Patchy left infrahilar opacities likely atelectasis, partially obscured by overlying monitoring device.   Dg Chest Port 1 View 09/04/2017 IMPRESSION:  1. Right IJ central venous catheter tip overlies the SVC. Negative for pneumothorax.  2. Endotracheal tube tip about 4.1 cm superior to carina  3. Clear lung fields   Dg Chest Port 1 View 09/05/2017 IMPRESSION:  Stable.  No acute cardiopulmonary findings.   Dg Chest Port 1 View 09/06/2017 IMPRESSION:  1. Endotracheal tube, enteric tube, and right central line in appropriate position.  2. Otherwise unchanged appearance of the chest without acute abnormality.   Dg Chest Port 1 View 09/07/2017 IMPRESSION: Tube and catheter positions as described without pneumothorax. No edema or consolidation. There is aortic atherosclerosis. Aortic Atherosclerosis (ICD10-I70.0).   Dg Chest Port 1 View 09/11/2017 IMPRESSION:  1. No acute findings.  No evidence of pneumonia or pulmonary edema. 2. Status post extubation and removal of the right internal jugular central venous line since previous day's study.   TTE - Left ventricle: The cavity size was normal. Wall thickness was normal. Systolic function was vigorous. The estimated ejection  fraction was in the range of 65% to 70%. Wall motion was normal; there were no regional wall motion abnormalities. Doppler parameters are consistent with abnormal left ventricular relaxation (grade 1 diastolic dysfunction). - Mitral valve: A bioprosthesis was present and functioning normally. Mean gradient (D): 3 mm Hg. Valve area by continuity equation (using LVOT flow): 1.88 cm^2. Impressions:  No cardiac source of emboli was indentified.  LE venous doppler  Right: There is no evidence of deep vein thrombosis in the lower extremity.There is no evidence of superficial venous thrombosis. No cystic structure found in the popliteal fossa. Left: There is no evidence of deep vein thrombosis in the lower extremity.There is no evidence of superficial venous thrombosis. No cystic structure found in the popliteal fossa.  EEG The EEG is normal with patient recording awake and drowsy state only.  TEE Pending  Per Dr Leonie Man - pt not quite ready for TEE   PHYSICAL EXAM  General - frail elderly african american male  Cardiovascular - Regular rate and rhythm.  Neuro - awake, alert, oriented to person, place and situation. Follows all simple commands.soft hypophonic voice. Excessive drooling. PERRL, both eyes left gaze palsy, will cross midline with effort, b/l eye right, up and downward gaze with nystagmus. Double vision. Can count fingers all fields but decreased L peripheral vision. Significant right facial droop. Significant dysarthria with lots of secretions. Tongue midline in mouth. No aphasia. LUE 4/5, LLE 4+/5 at least. No babinski. RUE 0/5 and RLE 3-/5, with positive babinski. DTR 1+.  Sensation intact bilaterally. Decreased coordination left. gait not tested.   ASSESSMENT/PLAN [name] is a 62 y.o. male with history of HTN and HLD admitted for unresponsive in car with questionable shaking episode, concerning for seizure. Also found to have right facial droop and was intubated for airway  protection. No tPA given due to OSW.   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 cardioembolic  Resultant intubated, eye movement difficulty, right facial droop, right hemiplegia  MRI left SCA and left pontine large infarcts as well as punctate right PICA infarct  MRA  Left VA slow flow vs. Occlusion  EEG - normal  CTA neck left V4 and VA origin occlusion  CT repeat 5/24 evolving L>R cerebellar and L pontine infarcts. Mild mass effect 4th vent but no hydrocephalus. New sm L SAH vs thrombosed vein. Given questionable hmg vs slow flow cortical vein, will hold plavix for now and repeat CT Monday.  CT repeat Mon 5/27 - unchanged (see above)  2D Echo EF 65-70%  TEE and Loop - hold off until pt is more stable per Dr Leonie Man  LE venous doppler no DVT  LDL 17  HgbA1c 5.5  NPO. Place [place] for feeding  Consider TEE and loop once medically more stabilized - will tentative place on schedule for Tues. Let Wannetta Sender know if pt not stable on Monday for TEE Tues -> done  Heparin subq for VTE prophylaxis  aspirin 81 mg daily prior to admission, now on aspirin 325 mg daily and clopidogrel 75 mg daily. Given questionable hmg will hold plavix for now, continue aspirin and repeat CT Monday.  If no hmg Monday, resume plavix - CT - unchanged - cont to hold Plavix  Ongoing aggressive stroke risk factor management  Therapy recommendations:  Pending. For evals today  Disposition:  Pending  Cerebellar edema  MRI showed large left cerebellar infarct  Repeat CT stable no hydrocephalus  Repeat CT no hydrocephalus  Off 3% saline due to high Na   Respiratory distress  Extubated.  Lots of secretions, high aspiration mix  Seizure-like activity  Doubt the "shaking" saw by EMS was seizure  Could be due to limb posturing due to brain stem infarct  EEG normal   No need AED this  time  Hypertension Stable Keep SBP goal < 180   Long term BP goal normotensive  Hyperlipidemia  Home meds:  lipitor 40   LDL 17, goal < 70  Now lipitor down to 10 mg  Continue lipitor 10 on discharge  Other Stroke Risk Factors  Advanced age  Other Active Problems  Leukocytosis resolved  AKI, resolved   High aspiration risk, failed swallow on TFs  CXR 5/27 -  No acute findings. Status post extubation and removal of the right internal jugular central venous line.  Consider scopolamine patch  Off of Plavix since 5/24 repeat CT in am and may restart (see above)   PLAN  Transfer to telemetry floor today  Cancel TEE/loop for Tuesday - Trish notified - patient not yet stable enough for procedure.  Will hold Plavix for now based on today's Head CT repeat.  Hospital day # [place] PA-C [facility] Pager [phone] 09/11/2017, 1:41 PM I have personally examined this patient, reviewed notes, independently viewed imaging studies, participated in medical decision making and plan of care.ROS completed by me personally and pertinent positives  fully documented  I have made any additions or clarifications directly to the above note. Agree with note above.  No family available at bedside for discussion.  Discussed with physical therapist and Dr. Marolyn Hammock pulmonary critical care medicine.This patient is critically ill and at significant risk of neurological worsening, death and care requires constant monitoring of vital signs, hemodynamics,respiratory and cardiac monitoring, extensive review of multiple databases, frequent neurological assessment, discussion with family, other specialists and medical decision making of high complexity.I have made any additions or clarifications directly to the above note.This critical care time does not reflect procedure time, or teaching time or supervisory time of PA/NP/Med Resident etc but could involve care discussion  time.  I spent 30 minutes of neurocritical care time  in the care of  this patient.     Antony Contras, MD Medical Director [facility] Pager: [phone] 09/11/2017 2:28 PM    To contact Stroke Continuity provider, please refer to [email or web address] After hours, contact General Neurology

## 2017-09-11 NOTE — Progress Notes (Signed)
Inpatient Rehabilitation Admissions Coordinator  [place], patient's brother, returned my call. He states patient lives alone and was independent pta, living in [place]. Danny lives in [place] and works full time. He is unable to provide assistance as a caregiver. [name] states Marjory Lies was at a SNF 4 years ago for 8 months after an admit at [facility]. [name] would like for patient to return to same facility. He will investigate name of facility and provide to [place]. I have alerted SW, Mardene Celeste, of need for SNF. We will sign off at this time. Please call me for any questions.   Danne Baxter, RN, MSN Rehab Admissions Coordinator [phone] 09/11/2017 1:34 PM

## 2017-09-11 NOTE — Progress Notes (Signed)
Patient transferred to 3W from 4N he is stable, has core track appears in right position SCDs are on IV fluid is running he is at goal with Jevity 1.2 which is 60. He had a cough when he was initially arrived on floor but that has subsided. Will continue to monitor.

## 2017-09-11 NOTE — Progress Notes (Signed)
Inpatient Rehabilitation Admissions Coordinator  I met with patient at bedside briefly. I then left a voicemail for his brother, [name], to contact me to begin discussions concerning patient's rehab venue needs and options. I will follow up once I receive call back.  Danne Baxter, RN, MSN Rehab Admissions Coordinator [phone] 09/11/2017 12:42 PM

## 2017-09-11 NOTE — Progress Notes (Signed)
By 2233, morning labs had not been drawn for pt... Notified phlebotomist and was informed that she would come draw labs as soon as possible. Had to clarify that pt was now a lab draw after IJ was D/C yesterday.  Guadalupe Maple, RN

## 2017-09-11 NOTE — Progress Notes (Signed)
  Speech Language Pathology Treatment: Dysphagia;Cognitive-Linquistic  Patient Details Name: Dillon Garcia MRN: [id number] DOB: Jun 30, 1955 Today's Date: 09/11/2017 Time: 1200-1220 SLP Time Calculation (min) (ACUTE ONLY): 20 min  Assessment / Plan / Recommendation Clinical Impression  Pt continues to struggle to manage secretions: he is able to initiate a swallow, but his ability to cough and expectorate is severely impaired. This, combined with moderate to severe dysphonia raises concern for possible vocal fold paralysis. Pt was unable to achieve a breath hold and throat clear despite cueing. SLP applied suction to base of tongue to aid in clearance of secretions. Subsequent trials of honey thick liquids via teaspoon eliced a subjectively strong swallow response, though slightly delayed. Given this presentation, recommend FEES tomorrow for objective assessment of swallow function and direct visualization of vocal fold movement.   In activities targeting communication, pt able to respond accurately to complex Y/N questions. Phrase length speech about 50 % intelligible due to severity of dysphonia. Attempted writing which was totally illegible, possibly due to visual deficits. Tested accuracy while pointing to a board, but pt unable to locate targets. With one eye covered accuracy improved significantly and pt clearly verbalized "I can see two of you" with one eye covered, "I can see one."    HPI HPI: 43 yoM with hx HTN, HLD, CAD, GERD, and active tobacco abuse, presents with an acute CVA.  MRI of brain showed acute bilateral (larger left, smaller right) cerebellar nonhemorrhagic infarcts, left pontine infarct. Intubated 5/20-5/23.      SLP Plan  Continue with current plan of care       Recommendations  Diet recommendations: NPO                Oral Care Recommendations: Oral care QID Follow up Recommendations: Inpatient Rehab Plan: Continue with current plan of care       GO                 Braylea Brancato, Katherene Ponto 09/11/2017, 1:16 PM

## 2017-09-11 NOTE — NC FL2 (Signed)
[place] LEVEL OF CARE SCREENING TOOL     IDENTIFICATION  Patient Name: Dillon Garcia Birthdate: 05-23-1955 Sex: male Admission Date (Current Location): 09/04/2017  [facility] and [place] Number:  Engineering geologist and Address:  [facility], [place] [address], [place], [place] [address]      Provider Number: [id number]  Attending Physician Name and Address:  Rosalin Hawking, MD  Relative Name and Phone Number:  [place], [place], [phone]    Current Level of Care: Hospital Recommended Level of Care: [place] Prior Approval Number:    Date Approved/Denied:   PASRR Number: [id number]  Discharge Plan: SNF    Current Diagnoses: Patient Active Problem List   Diagnosis Date Noted  . Aspiration into airway   . Benign essential HTN   . Tobacco abuse   . Seizures ([place])   . Tachypnea   . Bradycardia   . Diastolic dysfunction   . Hypernatremia   . Malnutrition of moderate degree 09/07/2017  . Encounter for orogastric (OG) tube placement   . Cerebral edema ([place]) 09/05/2017  . Stroke (cerebrum) ([place]) 09/04/2017  . Acute ischemic stroke ([place]) 09/04/2017    Orientation RESPIRATION BLADDER Height & Weight     Self, Situation, Place  Normal Continent Weight: 129 lb 10.1 oz (58.8 kg) Height:  5' 8"  (172.7 cm)(per pt report)  BEHAVIORAL SYMPTOMS/MOOD NEUROLOGICAL BOWEL NUTRITION STATUS      Continent Feeding tube  AMBULATORY STATUS COMMUNICATION OF NEEDS Skin   Limited Assist Verbally Normal                       Personal Care Assistance Level of Assistance  Feeding, Bathing, Dressing Bathing Assistance: Maximum assistance Feeding assistance: Limited assistance Dressing Assistance: Maximum assistance     Functional Limitations Info  Sight, Hearing, Speech Sight Info: Adequate Hearing Info: Impaired Speech Info: Adequate    SPECIAL CARE FACTORS FREQUENCY  PT (By licensed PT), OT (By licensed OT)     PT  Frequency: 4x week OT Frequency: 2x week            Contractures      Additional Factors Info  Code Status, Allergies, Insulin Sliding Scale Code Status Info: Full  Allergies Info: No Known Allergies   Insulin Sliding Scale Info: Insulin Daily       Current Medications (09/11/2017):  This is the current hospital active medication list Current Facility-Administered Medications  Medication Dose Route Frequency Provider Last Rate Last Dose  . 0.9 %  sodium chloride infusion   Intravenous Continuous Rosalin Hawking, MD 50 mL/hr at 09/11/17 1100    . acetaminophen (TYLENOL) tablet 650 mg  650 mg Oral Q4H PRN Amie Portland, MD       Or  . acetaminophen (TYLENOL) solution 650 mg  650 mg Per Tube Q4H PRN Amie Portland, MD   650 mg at 09/09/17 2213   Or  . acetaminophen (TYLENOL) suppository 650 mg  650 mg Rectal Q4H PRN Amie Portland, MD      . aspirin tablet 325 mg  325 mg Oral Daily Rosalin Hawking, MD   325 mg at 09/11/17 1055   Or  . aspirin suppository 300 mg  300 mg Rectal Daily Rosalin Hawking, MD      . atorvastatin (LIPITOR) tablet 40 mg  40 mg Oral q1800 Sampson Goon, MD   40 mg at 09/10/17 1747  . chlorhexidine gluconate (MEDLINE KIT) (PERIDEX) 0.12 %  solution 15 mL  15 mL Mouth Rinse BID Amie Portland, MD   15 mL at 09/11/17 0829  . docusate (COLACE) 50 MG/5ML liquid 100 mg  100 mg Per Tube BID Hammonds, Sharyn Blitz, MD   100 mg at 09/11/17 1055  . feeding supplement (JEVITY 1.2 CAL) liquid 1,000 mL  1,000 mL Per Tube Continuous Rosalin Hawking, MD 60 mL/hr at 09/11/17 1100    . feeding supplement (PRO-STAT SUGAR FREE 64) liquid 30 mL  30 mL Per Tube Daily Rosalin Hawking, MD   30 mL at 09/11/17 1055  . guaiFENesin (ROBITUSSIN) 100 MG/5ML solution 200 mg  10 mL Per Tube Q4H Mannam, Praveen, MD   200 mg at 09/11/17 1055  . heparin injection 5,000 Units  5,000 Units Subcutaneous Q8H Rosalin Hawking, MD   5,000 Units at 09/11/17 0603  . insulin aspart (novoLOG) injection 1-3 Units  1-3 Units  Subcutaneous Q4H Hammonds, Sharyn Blitz, MD   1 Units at 09/11/17 1255  . multivitamin liquid 15 mL  15 mL Per Tube Daily Sampson Goon, MD   15 mL at 09/11/17 1137  . ondansetron (ZOFRAN) injection 4 mg  4 mg Intravenous Q6H PRN Hammonds, Sharyn Blitz, MD      . pantoprazole sodium (PROTONIX) 40 mg/20 mL oral suspension 40 mg  40 mg Per Tube Daily Rosalin Hawking, MD   40 mg at 09/11/17 1055  . senna-docusate (Senokot-S) tablet 1 tablet  1 tablet Oral QHS PRN Amie Portland, MD   1 tablet at 09/06/17 2309     Discharge Medications: Please see discharge summary for a list of discharge medications.  Relevant Imaging Results:  Relevant Lab Results:   Additional Information SS#: [place], [place]

## 2017-09-11 NOTE — Progress Notes (Signed)
PULMONARY / CRITICAL CARE MEDICINE   Name: Dillon Garcia MRN: [id number] DOB: 1955/08/24    ADMISSION DATE:  09/04/2017  CHIEF COMPLAINT:  Found unresponsive  HISTORY OF PRESENT ILLNESS:    62yoM with hx HTN, HLD, CAD, GERD, and Active tobacco abuse, presents with an acute cerebellar CVA. Reportedly he was found unresponsive in his car outside [place] with questionable seizure activity. He was taken initially to [place] where he was seen to have right right facial droop, and Head CT showed an acute left cerebellar infarct. He was initially following simple commands, but his mental status then began to decline. He received a bolus of mannitol on arrival here and was started on 3% saline which has subsequently been discontinued. Extubated on 5/23.  SUBJECTIVE:  He is sitting up in a chair today and nodding to questions for me.  He does attempt to mouth some words.  He tells me that he is able to swallow his secretions adequately and is not having any difficulties with dyspnea.  He does have diplopia.  VITAL SIGNS: BP (!) 140/106   Pulse 77   Temp 98.6 F (37 C) (Oral)   Resp (!) 25   Ht 5\' 8"  (1.727 m) Comment: per pt report  Wt 129 lb 10.1 oz (58.8 kg)   SpO2 100%   BMI 19.71 kg/m   HEMODYNAMICS:    VENTILATOR SETTINGS:    INTAKE / OUTPUT: I/O last 3 completed shifts: In: 8299 [I.V.:1870; NG/GT:2220] Out: 1400 [Urine:1400]  PHYSICAL EXAMINATION: Gen:      Eating up in a chair, nodding appropriately to questions and attempting some speech.  In no distress.   HEENT: He is not drooling at present and seems to be swallowing his own secretions better than when last seen by me.   Neck:     No masses; no thyromegaly, No LAD Lungs:    Respirations are unlabored, there is symmetric air movement, no wheezes.   CV:         S1 and S2 are regular without murmur rub or gallop  Abd:      + bowel sounds; soft, non-tender; no palpable masses, no distension, Non-obese Ext:    No  edema; adequate peripheral perfusion, warm to touch with brisk cap refill Skin:      Warm and dry and intact, ; no rash Neuro:    He nods appropriately to questions, he does attempt some speech.  He appears to have a conjugate gaze,  Without exaggerated nystagmus.  Pupils are equal, he has a right hemiplegia  LABS:  BMET Recent Labs  Lab 09/09/17 0524 09/09/17 0906 09/10/17 0445 09/11/17 0633  NA 148* 149* 151* 149*  K 3.1*  --  3.3* 3.9  CL 112*  --  116* 117*  CO2 27  --  29 26  BUN 18  --  14 12  CREATININE 1.02  --  0.94 0.85  GLUCOSE 155*  --  94 127*    Electrolytes Recent Labs  Lab 09/07/17 1620  09/08/17 0923 09/08/17 1700 09/09/17 0524 09/10/17 0445 09/11/17 3716  CALCIUM  --    < >  --   --  9.0 8.9 9.2  MG 2.0  --  1.7 1.6*  --   --   --   PHOS 3.2  --  4.1 4.0  --   --   --    < > = values in this interval not displayed.    CBC Recent Labs  Lab 09/09/17 0524 09/10/17 0445 09/11/17 0633  WBC 8.7 7.8 8.2  HGB 15.2 14.0 13.7  HCT 47.2 44.4 43.0  PLT 154 160 155    Coag's No results for input(s): APTT, INR in the last 168 hours.  Sepsis Markers Recent Labs  Lab 09/04/17 2118 09/04/17 2314  LATICACIDVEN  --  1.0  PROCALCITON <0.10  --     ABG Recent Labs  Lab 09/04/17 2247 09/05/17 0300 09/07/17 0400  PHART 7.389 7.418 7.456*  PCO2ART 40.7 39.0 39.3  PO2ART 108.0 104 98.3    Liver Enzymes Recent Labs  Lab 09/04/17 2118  AST 44*  ALT 24  ALKPHOS 59  BILITOT 1.1  ALBUMIN 3.5    Cardiac Enzymes Recent Labs  Lab 09/04/17 2118  TROPONINI <0.03    Glucose Recent Labs  Lab 09/10/17 1132 09/10/17 1527 09/10/17 1951 09/10/17 2355 09/11/17 0349 09/11/17 0919  GLUCAP 147* 100* 115* 143* 124* 103*    Imaging Ct Head Wo Contrast  Result Date: 09/11/2017 CLINICAL DATA:  Stroke follow-up EXAM: CT HEAD WITHOUT CONTRAST TECHNIQUE: Contiguous axial images were obtained from the base of the skull through the vertex without  intravenous contrast. COMPARISON:  09/08/2017 FINDINGS: Brain: Small focus of superior left convexity parafalcine subarachnoid hemorrhage is unchanged. Bilateral cerebellar infarcts, left-greater-than-right, and left pontine infarct are unchanged. There is mass effect in the posterior fossa but the cerebral aqueduct and fourth ventricle remain patent. No hydrocephalus. No new area of hemorrhage. Focus of hypoattenuation in the right frontal lobe is unchanged. Vascular: No abnormal hyperdensity of the major intracranial arteries or dural venous sinuses. No intracranial atherosclerosis. Skull: The visualized skull base, calvarium and extracranial soft tissues are normal. Sinuses/Orbits: No fluid levels or advanced mucosal thickening of the visualized paranasal sinuses. No mastoid or middle ear effusion. The orbits are normal. IMPRESSION: 1. Unchanged appearance of cerebellar and pontine infarcts with persistent posterior fossa mass effect, but no obstructive hydrocephalus. 2. Unchanged small focus of subarachnoid hemorrhage over the parafalcine left convexity. Electronically Signed   By: Ulyses Jarred M.D.   On: 09/11/2017 02:57   Dg Chest Port 1 View  Result Date: 09/11/2017 CLINICAL DATA:  Respiratory failure. EXAM: PORTABLE CHEST 1 VIEW COMPARISON:  09/07/2017 FINDINGS: Since the previous exam, the endotracheal tube and right internal jugular central venous line have been removed. The nasogastric tube has been replaced with an enteric tube, which passes well below the diaphragm. Changes from cardiac surgery are stable. Lungs show prominent bronchovascular markings, also stable. No evidence of pneumonia or pulmonary edema. No pleural effusion or pneumothorax. IMPRESSION: 1. No acute findings.  No evidence of pneumonia or pulmonary edema. 2. Status post extubation and removal of the right internal jugular central venous line since previous day's study. Electronically Signed   By: Lajean Manes M.D.   On:  09/11/2017 07:50    STUDIES: Chest x-ray 09/07/2017- ET tube in good position.  Lungs are clear.  No acute abnormality I have reviewed the images personally.  CULTURES: Sputum of 5/21 is growing moderate Haemophilus which is a beta-lactamase producer ANTIBIOTICS: Zosyn started 5/20 for possible aspiration, now discontinued  SIGNIFICANT EVENTS:  LINES/TUBES: Right IJ CVP line placed 5/20  DISCUSSION: This is a 62 year old with a history of MVR and presumed CAD as well as active smoking who was found with altered level of consciousness.  CT scan has shown a large left cerebellar infarct with pontine involvement.  ASSESSMENT / PLAN:  PULMONARY Respiratory failure secondary to CVA Extubation  has been well-tolerated.  He is having less problem with gurgling secretions.  He likely remains at some increased risk of aspiration however the critical care service will not routinely follow please reconsult [place] as needed.  Lars Masson, MD  Critical Care Pager # [phone] 09/11/2017, 12:42 PM

## 2017-09-11 NOTE — Progress Notes (Signed)
Physical Therapy Treatment Patient Details Name: Dillon Garcia MRN: [id number] DOB: 1956-01-14 Today's Date: 09/11/2017    History of Present Illness 62 yo M with hx HTN, HLD, CAD, GERD, and Active tobacco abuse, presents with an acute CVA.  MRI of brain showed Acute LEFT greater than RIGHT pontocerebellar nonhemorrhagic ast medical history of GERD (gastroesophageal reflux disease), Heart disease, HTN (hypertension), Hyperlipidemia, Hypothyroid, Infectious endocarditis (2012), and Mitral valve disease.  Past surgical history that includes Median sternotomy and Mitral valve replacement.    PT Comments    Pt improving slowly.  Participates well and follows simple directions.  Pt has trouble managing his secretions and need wiping or suctioning frequently.  Emphasis on sitting balance and standing balance/tolerance.    Follow Up Recommendations  CIR     Equipment Recommendations  Wheelchair (measurements PT);Wheelchair cushion (measurements PT)    Recommendations for Other Services Rehab consult     Precautions / Restrictions Precautions Precautions: Fall Precaution Comments: right sided deficits, dizziness,    Mobility  Bed Mobility Overal bed mobility: Needs Assistance Bed Mobility: Sidelying to Sit;Rolling Rolling: Mod assist Sidelying to sit: Mod assist       General bed mobility comments: pt requires (A) to power up from bed surface and pushing with L UE.   Transfers Overall transfer level: Needs assistance Equipment used: (chair back) Transfers: Sit to/from Stand Sit to Stand: Mod assist;+2 physical assistance         General transfer comment: Worked on standing at EOB x3 sessions pt needed truncal support and significant hip extension assist.  Ambulation/Gait                 Stairs             Wheelchair Mobility    Modified Rankin (Stroke Patients Only) Modified Rankin (Stroke Patients Only) Pre-Morbid Rankin Score: No symptoms Modified  Rankin: Severe disability     Balance Overall balance assessment: Needs assistance Sitting-balance support: Single extremity supported;Feet supported Sitting balance-Leahy Scale: Poor Sitting balance - Comments: Worked at EOB on balance >10 min.  Emphasis on upright, midline orientation, w/shifting and coming back to midline, kicking legs on individually and maintaining balance.  pt needing min to mod assist overally with short episodes of min guard.   Standing balance support: Single extremity supported Standing balance-Leahy Scale: Poor Standing balance comment: 3 trials working on fully upright stance.  Pt had difficulty with "tucking" his hip into neural tilt and stayed forward even with significant attempts to help translate hips forward.                            Cognition Arousal/Alertness: Awake/alert Behavior During Therapy: WFL for tasks assessed/performed Overall Cognitive Status: Impaired/Different from baseline Area of Impairment: Following commands                       Following Commands: Follows multi-step commands with increased time              Exercises      General Comments        Pertinent Vitals/Pain Pain Assessment: No/denies pain    Home Living                      Prior Function            PT Goals (current goals can now be found in the care plan section) Acute  Rehab PT Goals Patient Stated Goal: difficulty speakin, low tone, hushed PT Goal Formulation: Patient unable to participate in goal setting Time For Goal Achievement: 09/19/17 Potential to Achieve Goals: Good Progress towards PT goals: Progressing toward goals    Frequency    Min 4X/week      PT Plan Current plan remains appropriate    Co-evaluation              AM-PAC PT "6 Clicks" Daily Activity  Outcome Measure  Difficulty turning over in bed (including adjusting bedclothes, sheets and blankets)?: Unable Difficulty moving from  lying on back to sitting on the side of the bed? : Unable Difficulty sitting down on and standing up from a chair with arms (e.g., wheelchair, bedside commode, etc,.)?: Unable Help needed moving to and from a bed to chair (including a wheelchair)?: A Lot Help needed walking in hospital room?: Total Help needed climbing 3-5 steps with a railing? : Total 6 Click Score: 7    End of Session   Activity Tolerance: Patient tolerated treatment well;Patient limited by fatigue Patient left: in chair;with call bell/phone within reach;with chair alarm set Nurse Communication: Mobility status PT Visit Diagnosis: Other abnormalities of gait and mobility (R26.89);Hemiplegia and hemiparesis Hemiplegia - Right/Left: Right Hemiplegia - dominant/non-dominant: Dominant Hemiplegia - caused by: Cerebral infarction     Time: 1610-9604 PT Time Calculation (min) (ACUTE ONLY): 37 min  Charges:  $Therapeutic Activity: 23-37 mins                    G Codes:       October 04, 2017  Donnella Sham, PT [phone]  (pager)   [name] 10/04/2017, 5:15 PM

## 2017-09-11 NOTE — Progress Notes (Addendum)
Patient was asleep and started to repeatedly cough and a small amount of liquid came out of his mouth.  It smelled like vomit and the coughing continued for about 5 minutes.  Once episode subsided, patient was asked if he vomited and he said burped up some.  There is concern that some was aspirated.  Dr Pearline Cables was notified and will continue to assess.  Patient is now comfortable and resting with no remarkable respiratory changes. Leelynn Whetsel C 5:45 PM  Zofran IV was given.

## 2017-09-12 ENCOUNTER — Encounter (HOSPITAL_COMMUNITY)
Admission: AD | Disposition: A | Discharge status: Skilled Nursing Facility | Payer: Self-pay | Source: Other Acute Inpatient Hospital | Attending: Neurology

## 2017-09-12 ENCOUNTER — Encounter (HOSPITAL_COMMUNITY): Payer: Self-pay | Admitting: Anesthesiology

## 2017-09-12 LAB — GLUCOSE, CAPILLARY
GLUCOSE-CAPILLARY: 121 mg/dL — AB (ref 65–99)
GLUCOSE-CAPILLARY: 95 mg/dL (ref 65–99)
Glucose-Capillary: 105 mg/dL — ABNORMAL HIGH (ref 65–99)
Glucose-Capillary: 82 mg/dL (ref 65–99)
Glucose-Capillary: 111 mg/dL — ABNORMAL HIGH (ref 65–99)
Glucose-Capillary: 108 mg/dL — ABNORMAL HIGH (ref 65–99)

## 2017-09-12 SURGERY — ECHOCARDIOGRAM, TRANSESOPHAGEAL
Anesthesia: Monitor Anesthesia Care

## 2017-09-12 MED ORDER — INSULIN ASPART 100 UNIT/ML ~~LOC~~ SOLN: 1.0000 [IU] | SUBCUTANEOUS | Status: DC

## 2017-09-12 MED ORDER — CHLORHEXIDINE GLUCONATE 0.12 % MT SOLN
OROMUCOSAL | Status: AC
  Filled 2017-09-12: qty 15

## 2017-09-12 MED ORDER — RESOURCE THICKENUP CLEAR PO POWD
ORAL | Status: DC | PRN
  Filled 2017-09-12: qty 125

## 2017-09-12 NOTE — Progress Notes (Signed)
Pts tube feeds stopped. Pt tolerating PO intake and states he feels full and beginning to have discomfort.

## 2017-09-12 NOTE — Procedures (Signed)
Objective Swallowing Evaluation: Type of Study: FEES-Fiberoptic Endoscopic Evaluation of Swallow   Patient Details  Name: Dillon Garcia MRN: [id number] Date of Birth: [birth year]  Today's Date: 09/12/2017 Time: SLP Start Time (ACUTE ONLY): 1000 -SLP Stop Time (ACUTE ONLY): 1030  SLP Time Calculation (min) (ACUTE ONLY): 30 min   Past Medical History:  Past Medical History:  Diagnosis Date  . GERD (gastroesophageal reflux disease)   . Heart disease   . HTN (hypertension)   . Hyperlipidemia   . Hypothyroid   . Infectious endocarditis 2012  . Mitral valve disease 2012   Past Surgical History:  Past Surgical History:  Procedure Laterality Date  . MEDIAN STERNOTOMY     CABG? valve replacement?  Marland Kitchen MITRAL VALVE REPLACEMENT  2012   HPI: 75 yoM with hx HTN, HLD, CAD, GERD, and active tobacco abuse, presents with an acute CVA.  MRI of brain showed acute bilateral (larger left, smaller right) cerebellar nonhemorrhagic infarcts, left pontine infarct. Intubated 5/20-5/23.   Subjective: alert, attempting to communicate    Assessment / Plan / Recommendation  CHL IP CLINICAL IMPRESSIONS 09/12/2017  Clinical Impression Pt demonstrates a moderate to severe oral dysphagia due to lingual and buccal weakness and discoordination leading to poorly controlled oral transit. Thin liquids spill quickly and prematurely, thickened liquids and puree require a longer passive transit with slow pumping and mild anteior spill. Pt needs slow pacing to ensure swallow trigger prior to administration of next bolus. Pharyngeal function is strong, but swallow initiation is significantly delayed suggesting sensory-motor deficits. Honey thick liquids and puree reach pyriform sinuses prior to the swallow, but regardless of bolus size do not penetrate airway.  Primary impairment is airway protection with pts secretions and thinner textures; pt cannot trigger a swallow fast enough to prevent aspiration and laryngeal closure is  incomplete due to indentations on bialteral vocal folds following intubation (though there may be a neuromuscular component: pt has equal adduction but cannot achieve full glottic closure at this time). Though sensation of aspiration is consistent, force of expectoration is impaired due to aforementioned glottic incompetence but also suspected neuromuscular impairment to cough mechanism. Recommend pt initiate a dys 1 (puree) diet and honey thick liquids with f/u with SLP for laryngeal closure exercises including respiratory muscle strength training.   SLP Visit Diagnosis Dysphagia, oral phase (R13.11);Dysphagia, pharyngeal phase (R13.13)  Attention and concentration deficit following --  Frontal lobe and executive function deficit following --  Impact on safety and function Moderate aspiration risk      CHL IP TREATMENT RECOMMENDATION 09/12/2017  Treatment Recommendations Therapy as outlined in treatment plan below     Prognosis 09/12/2017  Prognosis for Safe Diet Advancement Good  Barriers to Reach Goals --  Barriers/Prognosis Comment --    CHL IP DIET RECOMMENDATION 09/12/2017  SLP Diet Recommendations Dysphagia 1 (Puree) solids;Honey thick liquids  Liquid Administration via Cup;Spoon  Medication Administration Crushed with puree  Compensations Slow rate;Small sips/bites  Postural Changes Remain semi-upright after after feeds/meals (Comment);Seated upright at 90 degrees      CHL IP OTHER RECOMMENDATIONS 09/12/2017  Recommended Consults --  Oral Care Recommendations Oral care BID  Other Recommendations Order thickener from pharmacy;Have oral suction available      CHL IP FOLLOW UP RECOMMENDATIONS 09/12/2017  Follow up Recommendations Skilled Nursing facility      [facility] IP FREQUENCY AND DURATION 09/12/2017  Speech Therapy Frequency (ACUTE ONLY) min 3x week  Treatment Duration 2 weeks  CHL IP ORAL PHASE 09/12/2017  Oral Phase Impaired  Oral - Pudding Teaspoon --  Oral -  Pudding Cup --  Oral - Honey Teaspoon Right anterior bolus loss;Lingual pumping;Reduced posterior propulsion;Piecemeal swallowing;Delayed oral transit;Decreased bolus cohesion;Premature spillage  Oral - Honey Cup Right anterior bolus loss;Lingual pumping;Reduced posterior propulsion;Piecemeal swallowing;Delayed oral transit;Decreased bolus cohesion;Premature spillage  Oral - Nectar Teaspoon Right anterior bolus loss;Lingual pumping;Reduced posterior propulsion;Piecemeal swallowing;Delayed oral transit;Decreased bolus cohesion;Premature spillage  Oral - Nectar Cup --  Oral - Nectar Straw --  Oral - Thin Teaspoon --  Oral - Thin Cup --  Oral - Thin Straw --  Oral - Puree Right anterior bolus loss;Lingual pumping;Reduced posterior propulsion;Piecemeal swallowing;Delayed oral transit;Decreased bolus cohesion;Premature spillage  Oral - Mech Soft --  Oral - Regular --  Oral - Multi-Consistency --  Oral - Pill --  Oral Phase - Comment --    CHL IP PHARYNGEAL PHASE 09/12/2017  Pharyngeal Phase Impaired  Pharyngeal- Pudding Teaspoon --  Pharyngeal --  Pharyngeal- Pudding Cup --  Pharyngeal --  Pharyngeal- Honey Teaspoon Delayed swallow initiation-pyriform sinuses  Pharyngeal --  Pharyngeal- Honey Cup Delayed swallow initiation-pyriform sinuses  Pharyngeal --  Pharyngeal- Nectar Teaspoon Delayed swallow initiation-pyriform sinuses;Penetration/Aspiration before swallow;Trace aspiration  Pharyngeal Material enters airway, passes BELOW cords then ejected out  Pharyngeal- Nectar Cup --  Pharyngeal --  Pharyngeal- Nectar Straw --  Pharyngeal --  Pharyngeal- Thin Teaspoon --  Pharyngeal --  Pharyngeal- Thin Cup --  Pharyngeal --  Pharyngeal- Thin Straw --  Pharyngeal --  Pharyngeal- Puree Delayed swallow initiation-pyriform sinuses  Pharyngeal --  Pharyngeal- Mechanical Soft --  Pharyngeal --  Pharyngeal- Regular --  Pharyngeal --  Pharyngeal- Multi-consistency --  Pharyngeal --   Pharyngeal- Pill --  Pharyngeal --  Pharyngeal Comment --     No flowsheet data found.  No flowsheet data found. Herbie [place], [place] CCC-SLP [phone]  Lynann Beaver 09/12/2017, 10:47 AM

## 2017-09-12 NOTE — Progress Notes (Addendum)
STROKE TEAM PROGRESS NOTE   SUBJECTIVE (INTERVAL HISTORY) Lying in bed. R eye patch helps with double vision. States he ate all his breakfast. Therapy working with him. Plans for SNF for rehab. Awaiting dietary input for medical readiness.  OBJECTIVE Temp:  [98.4 F (36.9 C)-98.6 F (37 C)] 98.6 F (37 C) (05/28 1115) Pulse Rate:  [66-84] 66 (05/29 0423) Cardiac Rhythm: Normal sinus rhythm (05/29 0700) Resp:  [14-25] 20 (05/29 0423) BP: (132-163)/(86-96) 155/95 (05/29 0423) SpO2:  [95 %-100 %] 96 % (05/29 0423) Weight:  [63.6 kg (140 lb 3.4 oz)] 63.6 kg (140 lb 3.4 oz) (05/29 0423)  Recent Labs  Lab 09/12/17 0813 09/12/17 1113 09/12/17 1631 09/12/17 2026 09/13/17 0000  GLUCAP 82 111* 95 108* 89   Recent Labs  Lab 09/07/17 0516  09/07/17 1620  09/08/17 0415  09/08/17 0923 09/08/17 1700 09/08/17 2110 09/09/17 0524 09/09/17 0906 09/10/17 0445 09/11/17 0633  NA 153*   < > 153*   < > 153*   < >  --   --  150* 148* 149* 151* 149*  K 3.3*  --   --   --  3.5  --   --   --   --  3.1*  --  3.3* 3.9  CL 116*  --   --   --  114*  --   --   --   --  112*  --  116* 117*  CO2 29  --   --   --  29  --   --   --   --  27  --  29 26  GLUCOSE 130*  --   --   --  95  --   --   --   --  155*  --  94 127*  BUN 10  --   --   --  8  --   --   --   --  18  --  14 12  CREATININE 1.06  --   --   --  1.02  --   --   --   --  1.02  --  0.94 0.85  CALCIUM 9.1  --   --   --  9.4  --   --   --   --  9.0  --  8.9 9.2  MG 2.0  --  2.0  --   --   --  1.7 1.6*  --   --   --   --   --   PHOS 2.1*  --  3.2  --   --   --  4.1 4.0  --   --   --   --   --    < > = values in this interval not displayed.    Recent Labs  Lab 09/07/17 0516 09/08/17 0415 09/09/17 0524 09/10/17 0445 09/11/17 0633  WBC 10.7* 10.4 8.7 7.8 8.2  NEUTROABS 7.4  --  6.0  --   --   HGB 13.2 14.6 15.2 14.0 13.7  HCT 41.7 46.2 47.2 44.4 43.0  MCV 93.1 93.1 90.6 90.6 91.3  PLT 129* 151 154 160 155     PHYSICAL EXAM General  - frail african [place] male  Cardiovascular - Regular rate and rhythm. Neuro - awake, alert, oriented to person, place and situation. Follows all simple commands.soft hypophonic voice. Significant dysarthria with lots of secretions. Excessive drooling. PERRL, both eyes left gaze palsy, will cross midline with effort,  b/l eye right, up and downward gaze with nystagmus. Double vision. Can count fingers all fields but decreased L peripheral vision. Significant right facial droop. Tongue midline in mouth. No aphasia. LUE 4/5, LLE 4+/5 at least. No babinski. RUE 0/5 and RLE 3-/5, with positive babinski. Sensation intact bilaterally. Decreased coordination left. Gait not tested.   ASSESSMENT/PLAN [name] is a 62 y.o. male with history of HTN and HLD admitted for unresponsive in car with questionable shaking episode, concerning for seizure. Also found to have right facial droop and was intubated for airway protection. No tPA given due to OSW.   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 cardioembolic  Resultant intubated, eye movement difficulty, right facial droop, right hemiplegia  MRI left SCA and left pontine large infarcts as well as punctate right PICA infarct  MRA  Left VA slow flow vs. Occlusion  EEG - normal  CTA neck left V4 and VA origin occlusion  CT repeat 5/24 evolving L>R cerebellar and L pontine infarcts. Mild mass effect 4th vent but no hydrocephalus. New sm L SAH vs thrombosed vein. Given questionable hmg vs slow flow cortical vein, will hold plavix for now and repeat CT Monday.  CT repeat 5/27 - stable  2D Echo EF 65-70%  TEE and Loop - consider as an OP when more medically stable   LE venous doppler no DVT  LDL 17  HgbA1c 5.5 Diet Order           DIET - DYS 1 Room service appropriate? Yes; Fluid consistency: Honey Thick  Diet effective now           Heparin subq for VTE prophylaxis  aspirin 81 mg daily prior to admission, now on aspirin 325 mg daily. Given hmg continue to holding plavix for now, continue aspirin   Ongoing aggressive stroke risk factor management  Therapy recommendations:  CIR (but lives alone and has no one to care for following) ->SNF, wheel chair and w/c cushion  Disposition:  Pending  Seizure-like activity (on admission)  Doubt the "shaking" saw by EMS was seizure  Could be due to limb posturing due to brain stem infarct  EEG normal   No need AED this time  Cerebellar edema  MRI showed large left cerebellar infarct  Repeat CT stable no hydrocephalus  Repeat CT no hydrocephalus  Off 3% saline due to high Na   Last NA 149  Check labs in am  Respiratory distress  Intubated, now extubated.  Lots of secretions, high aspiration risk  Dysphagia  Secondary to stroke  Diet Order                 DIET - DYS 1 Room service appropriate? Yes; Fluid consistency: Honey Thick  Diet effective now          panda remains in. TF not running. SLP will let us know when ok to remove  Coughs intermittently, working on self feeds  Hypertension Elevated 157/116 Keep SBP goal < 180 Long term BP goal normotensive  Hyperlipidemia  Home meds:  lipitor 40   LDL 17, goal < 70  Now lipitor down to 10 mg  Continue lipitor 10 on discharge  Other Stroke Risk Factors  Advanced age  Other Active Problems  Leukocytosis resolved  AKI, resolved   High aspiration risk, failed swallow on TFs  CXR 5/27 -  No acute findings. Status  post extubation and removal of the right internal jugular central venous line.  Consider scopolamine patch  Hospital day # [place], MSN, APRN, ANVP-BC, AGPCNP-BC Advanced Practice Stroke Nurse [place] for Schedule & Pager information 09/13/2017 2:03 PM  Agree with above plan. Monitor nutritional intake for the next day or 2 and  transfer to rehabilitation. No family available at the bedside for discussion. Antony Contras, MD  To contact Stroke Continuity provider, please refer to [email or web address] After hours, contact General Neurology

## 2017-09-12 NOTE — Progress Notes (Signed)
STROKE TEAM PROGRESS NOTE   SUBJECTIVE (INTERVAL HISTORY) No family members present. Therapist working with pt bedside.Hold off on TEE / Loop for now.may consider later as outpatient depending on his recovery and fall risk sutuation. Speech therapy has cleared him for dysphagia 1 diet and nursing home recommendation has been made for rehabilitation  OBJECTIVE Temp:  [98.1 F (36.7 C)-98.6 F (37 C)] 98.6 F (37 C) (05/28 1115) Pulse Rate:  [71-85] 74 (05/28 0348) Cardiac Rhythm: Normal sinus rhythm (05/28 0700) Resp:  [14-23] 18 (05/28 0348) BP: (126-152)/(69-96) 152/87 (05/28 1115) SpO2:  [51 %-99 %] 97 % (05/28 1115) Weight:  [139 lb 1.8 oz (63.1 kg)-140 lb 3.4 oz (63.6 kg)] 140 lb 3.4 oz (63.6 kg) (05/28 0500)  Recent Labs  Lab 09/11/17 2000 09/12/17 0014 09/12/17 0307 09/12/17 0813 09/12/17 1113  GLUCAP 98 121* 105* 82 111*   Recent Labs  Lab 09/07/17 0516  09/07/17 1620  09/08/17 0415  09/08/17 0923 09/08/17 1700 09/08/17 2110 09/09/17 0524 09/09/17 0906 09/10/17 0445 09/11/17 0633  NA 153*   < > 153*   < > 153*   < >  --   --  150* 148* 149* 151* 149*  K 3.3*  --   --   --  3.5  --   --   --   --  3.1*  --  3.3* 3.9  CL 116*  --   --   --  114*  --   --   --   --  112*  --  116* 117*  CO2 29  --   --   --  29  --   --   --   --  27  --  29 26  GLUCOSE 130*  --   --   --  95  --   --   --   --  155*  --  94 127*  BUN 10  --   --   --  8  --   --   --   --  18  --  14 12  CREATININE 1.06  --   --   --  1.02  --   --   --   --  1.02  --  0.94 0.85  CALCIUM 9.1  --   --   --  9.4  --   --   --   --  9.0  --  8.9 9.2  MG 2.0  --  2.0  --   --   --  1.7 1.6*  --   --   --   --   --   PHOS 2.1*  --  3.2  --   --   --  4.1 4.0  --   --   --   --   --    < > = values in this interval not displayed.   No results for input(s): AST, ALT, ALKPHOS, BILITOT, PROT, ALBUMIN in the last 168 hours. Recent Labs  Lab 09/07/17 0516 09/08/17 0415 09/09/17 0524 09/10/17 0445  09/11/17 0633  WBC 10.7* 10.4 8.7 7.8 8.2  NEUTROABS 7.4  --  6.0  --   --   HGB 13.2 14.6 15.2 14.0 13.7  HCT 41.7 46.2 47.2 44.4 43.0  MCV 93.1 93.1 90.6 90.6 91.3  PLT 129* 151 154 160 155   No results for input(s): CKTOTAL, CKMB, CKMBINDEX, TROPONINI in the last 168 hours. No results for input(s): LABPROT,  INR in the last 72 hours. No results for input(s): COLORURINE, LABSPEC, [place], GLUCOSEU, HGBUR, BILIRUBINUR, KETONESUR, PROTEINUR, UROBILINOGEN, NITRITE, LEUKOCYTESUR in the last 72 hours.  Invalid input(s): APPERANCEUR     Component Value Date/Time   CHOL 79 09/05/2017 0300   TRIG 102 09/05/2017 0300   HDL 42 09/05/2017 0300   CHOLHDL 1.9 09/05/2017 0300   VLDL 20 09/05/2017 0300   LDLCALC 17 09/05/2017 0300   Lab Results  Component Value Date   HGBA1C 5.5 09/05/2017      Component Value Date/Time   LABOPIA NONE DETECTED 09/04/2017 2314   COCAINSCRNUR NONE DETECTED 09/04/2017 2314   COCAINSCRNUR NONE DETECTED 09/04/2017 0602   LABBENZ POSITIVE (A) 09/04/2017 2314   AMPHETMU NONE DETECTED 09/04/2017 2314   THCU NONE DETECTED 09/04/2017 2314   LABBARB NONE DETECTED 09/04/2017 2314    No results for input(s): ETH in the last 168 hours.  IMAGING  Ct Head Wo Contrast 09/04/2017 IMPRESSION:  1. Evolving acute ischemic infarct involving the superior left cerebellar hemisphere, left superior cerebellar artery territory. No associated hemorrhage or significant mass effect at this time.  2. Age-related cerebral atrophy with mild chronic small vessel ischemic disease.    Mr Brain Wo Contrast 09/04/2017 IMPRESSION:  MRI HEAD:  1. Motion degraded examination.  2. 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 Regional mass effect without obstructive hydrocephalus.   MRA HEAD:  1. Slow flow versus occluded LEFT vertebral artery.  2. No flow limiting stenosis on this motion degraded examination.    Ct Head Wo  Contrast 09/05/2017 IMPRESSION:  1. Continued interval evolution of acute ischemic left greater than right pontocerebellar infarcts, with slightly increased localized edema as compared to previous. Adjacent fourth ventricle remains patent without hydrocephalus. No evidence for hemorrhagic transformation or other complication.  2. No other new acute intracranial abnormality.   Ct Angio Neck W Or Wo Contrast 09/05/2017 IMPRESSION:  1. 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  2. Separate segment of left vertebral artery origin occlusion/near occlusion.  3. Patent distal left V2, V3, and V4 with hypoenhancement, probably representing retrograde flow.  4. Patent right vertebral and bilateral carotid systems without significant stenosis by NASCET criteria.    Ct Head Wo Contrast 09/06/2017 IMPRESSION:  1. Evolving acute LEFT greater than RIGHT cerebellar and LEFT pontine nonhemorrhagic infarcts. Similar cytotoxic edema with mild mass effect on fourth ventricle. No hydrocephalus.    Ct Head Wo Contrast 09/08/2017 IMPRESSION:  1. Evolving acute LEFT greater than RIGHT cerebellar and LEFT pontine nonhemorrhagic infarcts. Mild mass effect on fourth ventricle without hydrocephalus.  2. New small volume LEFT subarachnoid hemorrhage versus thrombosed cortical vein.    Ct Head Wo Contrast 09/11/2017 IMPRESSION:  1. Unchanged appearance of cerebellar and pontine infarcts with persistent posterior fossa mass effect, but no obstructive hydrocephalus. 2. Unchanged small focus of subarachnoid hemorrhage over the parafalcine left convexity.     Dg Abd Portable 1v 09/04/2017 IMPRESSION:  1. Apparent temperature probe overlies the inferior anatomic pelvis and appears to contain metallic wire.  2. Otherwise no unexpected metallic foreign body over the abdomen or pelvis.   Dg Pelvis  Portable 09/04/2017 IMPRESSION:  Negative exam for metal in the pelvis.   Dg Abd Portable 1v 09/06/2017 IMPRESSION: OG tube tip in the fundus of the stomach.  Dg Abd Portable 1v 09/06/2017 IMPRESSION:  NG tip  in the region of the gastric antrum.     Dg Chest Port 1 View 09/04/2017 IMPRESSION:  Endotracheal tube tip projects 6.4 cm above the carina, consider 1-2 cm advancement. Nasogastric tube past proximal stomach, gas distended stomach. Mild bronchitic changes. Aortic Atherosclerosis   Dg Chest Port 1 View 09/04/2017 IMPRESSION:  1. Endotracheal tube tip just above the clavicular heads 7.4 cm from the carina, advancement of 1-2 cm would lead to optimal placement.  2. Patchy left infrahilar opacities likely atelectasis, partially obscured by overlying monitoring device.   Dg Chest Port 1 View 09/04/2017 IMPRESSION:  1. Right IJ central venous catheter tip overlies the SVC. Negative for pneumothorax.  2. Endotracheal tube tip about 4.1 cm superior to carina  3. Clear lung fields   Dg Chest Port 1 View 09/05/2017 IMPRESSION:  Stable.  No acute cardiopulmonary findings.   Dg Chest Port 1 View 09/06/2017 IMPRESSION:  1. Endotracheal tube, enteric tube, and right central line in appropriate position.  2. Otherwise unchanged appearance of the chest without acute abnormality.   Dg Chest Port 1 View 09/07/2017 IMPRESSION: Tube and catheter positions as described without pneumothorax. No edema or consolidation. There is aortic atherosclerosis. Aortic Atherosclerosis (ICD10-I70.0).   Dg Chest Port 1 View 09/11/2017 IMPRESSION:  1. No acute findings.  No evidence of pneumonia or pulmonary edema. 2. Status post extubation and removal of the right internal jugular central venous line since previous day's study.   TTE - Left ventricle: The cavity size was normal. Wall thickness was normal. Systolic function was vigorous. The estimated ejection fraction was in the range of 65% to  70%. Wall motion was normal; there were no regional wall motion abnormalities. Doppler parameters are consistent with abnormal left ventricular relaxation (grade 1 diastolic dysfunction). - Mitral valve: A bioprosthesis was present and functioning normally. Mean gradient (D): 3 mm Hg. Valve area by continuity equation (using LVOT flow): 1.88 cm^2. Impressions:  No cardiac source of emboli was indentified.  LE venous doppler  Right: There is no evidence of deep vein thrombosis in the lower extremity.There is no evidence of superficial venous thrombosis. No cystic structure found in the popliteal fossa. Left: There is no evidence of deep vein thrombosis in the lower extremity.There is no evidence of superficial venous thrombosis. No cystic structure found in the popliteal fossa.  EEG The EEG is normal with patient recording awake and drowsy state only.  TEE Pending  Per Dr Leonie Man - pt not quite ready for TEE may consider later as outpatient   PHYSICAL EXAM  General - frail elderly african american male  Cardiovascular - Regular rate and rhythm.  Neuro - awake, alert, oriented to person, place and situation. Follows all simple commands.soft hypophonic voice. Excessive drooling. PERRL,.hypertropia left eye.  left gaze palsy, will cross midline with effort, b/l eye right, up and downward gaze with nystagmus and saccadic dysmetria. Double vision. Can count fingers all fields but decreased L peripheral vision. Significant right facial droop. Significant dysarthria with lots of secretions. Tongue midline in mouth. No aphasia. LUE 4/5, LLE 4+/5 at least. No babinski. RUE 0/5 and RLE 3-/5, with positive babinski. DTR 1+. Sensation intact bilaterally. Decreased coordination left. gait not tested.   ASSESSMENT/PLAN Dillon Garcia is a 62 y.o. male with history of HTN and HLD admitted for unresponsive in car with questionable shaking episode, concerning for seizure. Also found to have right facial droop  and was intubated for airway protection. No tPA given due to OSW.  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 Occlusion  EEG - normal  CTA neck left V4 and VA origin occlusion  CT repeat 5/24 evolving L>R cerebellar and L pontine infarcts. Mild mass effect 4th vent but no hydrocephalus. New sm L SAH vs thrombosed vein. Given questionable hmg vs slow flow cortical vein, will hold plavix for now and repeat CT Monday.  CT repeat Mon 5/27 - unchanged (see above)  2D Echo EF 65-70%  TEE and Loop - hold off until pt is more stable per Dr Leonie Man  LE venous doppler no DVT  LDL 17  HgbA1c 5.5  NPO. Place [place] for feeding  Consider TEE and loop once medically more stabilized -    Heparin subq for VTE prophylaxis  aspirin 81 mg daily prior to admission, now on aspirin 325 mg daily and clopidogrel 75 mg daily. Given questionable hmg will hold plavix for now, continue aspirin and repeat CT Monday.  If no hmg Monday, resume plavix - CT - unchanged - cont to hold Plavix  Ongoing aggressive stroke risk factor management  Therapy recommendations:  Pending. For evals today  Disposition:  Pending  Cerebellar edema  MRI showed large left cerebellar infarct  Repeat CT stable no hydrocephalus  Repeat CT no hydrocephalus  Off 3% saline due to high Na   Respiratory distress  Extubated.  Lots of secretions, high aspiration mix  Seizure-like activity  Doubt the "shaking" saw by EMS was seizure  Could be due to limb posturing due to brain stem infarct  EEG normal   No need AED this time  Hypertension Stable Keep SBP goal < 180   Long term BP goal normotensive  Hyperlipidemia  Home meds:  lipitor 40    LDL 17, goal < 70  Now lipitor down to 10 mg  Continue lipitor 10 on discharge  Other Stroke Risk Factors  Advanced age  Other Active Problems  Leukocytosis resolved  AKI, resolved   High aspiration risk, failed swallow on TFs  CXR 5/27 -  No acute findings. Status post extubation and removal of the right internal jugular central venous line.  Consider scopolamine patch  Off of Plavix since 5/24 repeat CT in am and may restart (see above)   PLAN  Start dysphgia 1 diet per speech therapy     Will hold Plavix for now based on 09/11/17 Head CT repeat.  Anticipate SNF placement next few days  Hospital day # 8      start dysphagia 1 diet. Continue physical occupational speech therapy. Anticipate transfer to skilled nursing facility for rehabilitation in the next few days. Will consider elective TEE/group when medically more improved as an outpatient. Continue aspirin for now. Greater than 50% time during this 25 minute visit was spent on coordination of care and stroke and dysphagia   Antony Contras, [place] Pager: [phone] 09/12/2017 12:34 PM    To contact Stroke Continuity provider, please refer to [email or web address] After hours, contact General Neurology

## 2017-09-12 NOTE — Progress Notes (Signed)
Physical Therapy Treatment Patient Details Name: Dillon Garcia MRN: [id number] DOB: [birth year] Today's Date: 09/12/2017    History of Present Illness 62 yo M with hx HTN, HLD, CAD, GERD, and Active tobacco abuse, presents with an acute CVA.  MRI of brain showed Acute LEFT greater than RIGHT pontocerebellar nonhemorrhagic ast medical history of GERD (gastroesophageal reflux disease), Heart disease, HTN (hypertension), Hyperlipidemia, Hypothyroid, Infectious endocarditis (2012), and Mitral valve disease.  Past surgical history that includes Median sternotomy and Mitral valve replacement.    PT Comments    Patient is making progress toward mobility goals and participatory and following multi step commands with increased time. Pt able to stand X2 with use of Stedy and mod A +2 due to R side weakness. Pt assisted R UE/LE with L hand without cues and able to use suction for secretions during session. Continue to progress as tolerated.   Follow Up Recommendations  CIR     Equipment Recommendations  Wheelchair (measurements PT);Wheelchair cushion (measurements PT)    Recommendations for Other Services Rehab consult     Precautions / Restrictions Precautions Precautions: Fall Precaution Comments: right sided deficits, dizziness, Restrictions Weight Bearing Restrictions: No    Mobility  Bed Mobility Overal bed mobility: Needs Assistance Bed Mobility: Supine to Sit     Supine to sit: Mod assist     General bed mobility comments: increased time and effort and cues required for sequencing; pt able to bring L LE off of bed without assist and needed min A for R LE to EOB; mod A to elevate trunk into sitting and to bring hips toward EOB  Transfers Overall transfer level: Needs assistance   Transfers: Sit to/from Stand Sit to Stand: Mod assist;+2 physical assistance         General transfer comment: cues for sequencing; pt able to stand with use of Stedy with +2 assist to bring hips  anterior   Ambulation/Gait                 Stairs             Wheelchair Mobility    Modified Rankin (Stroke Patients Only) Modified Rankin (Stroke Patients Only) Pre-Morbid Rankin Score: No symptoms Modified Rankin: Severe disability     Balance Overall balance assessment: Needs assistance Sitting-balance support: Single extremity supported;Feet supported Sitting balance-Leahy Scale: Poor Sitting balance - Comments: worked on sitting balance EOB with mod A for weight shifting and max A for anterior pelvic tilt and upright posture; pt assisted as able with cues Postural control: Posterior lean;Right lateral lean Standing balance support: Single extremity supported Standing balance-Leahy Scale: Poor                              Cognition Arousal/Alertness: Awake/alert Behavior During Therapy: WFL for tasks assessed/performed Overall Cognitive Status: Impaired/Different from baseline Area of Impairment: Following commands                       Following Commands: Follows multi-step commands with increased time       General Comments: pt responds appropriately with head nods and thumbs up/down      Exercises      General Comments General comments (skin integrity, edema, etc.): pt with bandage over R eye upon arrival       Pertinent Vitals/Pain Pain Assessment: No/denies pain Faces Pain Scale: No hurt    Home Living  Prior Function            PT Goals (current goals can now be found in the care plan section) Acute Rehab PT Goals PT Goal Formulation: Patient unable to participate in goal setting Time For Goal Achievement: 09/19/17 Potential to Achieve Goals: Good Progress towards PT goals: Progressing toward goals    Frequency    Min 4X/week      PT Plan Current plan remains appropriate    Co-evaluation              AM-PAC PT "6 Clicks" Daily Activity  Outcome Measure   Difficulty turning over in bed (including adjusting bedclothes, sheets and blankets)?: Unable Difficulty moving from lying on back to sitting on the side of the bed? : Unable Difficulty sitting down on and standing up from a chair with arms (e.g., wheelchair, bedside commode, etc,.)?: Unable Help needed moving to and from a bed to chair (including a wheelchair)?: A Lot Help needed walking in hospital room?: Total Help needed climbing 3-5 steps with a railing? : Total 6 Click Score: 7    End of Session Equipment Utilized During Treatment: Gait belt Activity Tolerance: Patient tolerated treatment well Patient left: in chair;with chair alarm set;with call bell/phone within reach Nurse Communication: Mobility status PT Visit Diagnosis: Other abnormalities of gait and mobility (R26.89);Hemiplegia and hemiparesis Hemiplegia - Right/Left: Right Hemiplegia - dominant/non-dominant: Dominant Hemiplegia - caused by: Cerebral infarction     Time: 5009-3818 PT Time Calculation (min) (ACUTE ONLY): 35 min  Charges:  $Therapeutic Activity: 23-37 mins                    G Codes:       Earney Navy, PTA Pager: [phone]     Darliss Cheney 09/12/2017, 3:55 PM

## 2017-09-12 NOTE — Progress Notes (Signed)
Patients cough was keeping him from falling asleep I gave his 0200 cough medicine early so he could sleep, will continue to monitor.

## 2017-09-13 LAB — GLUCOSE, CAPILLARY
GLUCOSE-CAPILLARY: 89 mg/dL (ref 65–99)
Glucose-Capillary: 127 mg/dL — ABNORMAL HIGH (ref 65–99)

## 2017-09-13 NOTE — Progress Notes (Signed)
Physical Therapy Treatment Patient Details Name: Dillon Garcia MRN: [id number] DOB: 01/29/1956 Today's Date: 09/13/2017    History of Present Illness 62 yo M with hx HTN, HLD, CAD, GERD, and Active tobacco abuse, presents with an acute CVA.  MRI of brain showed Acute LEFT greater than RIGHT pontocerebellar nonhemorrhagic ast medical history of GERD (gastroesophageal reflux disease), Heart disease, HTN (hypertension), Hyperlipidemia, Hypothyroid, Infectious endocarditis (2012), and Mitral valve disease.  Past surgical history that includes Median sternotomy and Mitral valve replacement.    PT Comments    Patient seen for mobility progression and very pleasant and participatory throughout session. Pt able to stand X 3 and stand pivot bed to recliner with +2 assist due to R side weakness. Pt continues to c/o diplopia and dizziness. Pt attempted to speak more this session. Pt continues to be a good candidate for CIR level therapies.   Follow Up Recommendations  CIR     Equipment Recommendations  Wheelchair (measurements PT);Wheelchair cushion (measurements PT)    Recommendations for Other Services Rehab consult     Precautions / Restrictions Precautions Precautions: Fall Precaution Comments: right side weakness, dizziness, diplopia Restrictions Weight Bearing Restrictions: No    Mobility  Bed Mobility Overal bed mobility: Needs Assistance Bed Mobility: Supine to Sit Rolling: Min assist Sidelying to sit: Mod assist       General bed mobility comments: cues for sequencing and to use bed rail to assist with rolling toward R side; assistance required to bring R LE to EOB and to elevate trunk into sitting  Transfers Overall transfer level: Needs assistance Equipment used: (chair back) Transfers: Sit to/from [facility] Sit to Stand: Mod assist;+2 physical assistance Stand pivot transfers: Mod assist;+2 physical assistance       General transfer comment: pt stood  X 3 from EOB; attempted marching in place with assist to weight shift; pt assisted to stand with L UE/LE and with R knee blocked and R UE supported; multimodal cues for upright posture and facilitation at glutes to bring hips anterior   Ambulation/Gait                 Stairs             Wheelchair Mobility    Modified Rankin (Stroke Patients Only) Modified Rankin (Stroke Patients Only) Pre-Morbid Rankin Score: No symptoms Modified Rankin: Severe disability     Balance Overall balance assessment: Needs assistance Sitting-balance support: Single extremity supported;Feet supported Sitting balance-Leahy Scale: Poor   Postural control: Posterior lean;Right lateral lean Standing balance support: Single extremity supported Standing balance-Leahy Scale: Poor                              Cognition Arousal/Alertness: Awake/alert Behavior During Therapy: WFL for tasks assessed/performed Overall Cognitive Status: Impaired/Different from baseline Area of Impairment: Following commands                       Following Commands: Follows multi-step commands with increased time              Exercises      General Comments        Pertinent Vitals/Pain Pain Assessment: Faces Faces Pain Scale: No hurt    Home Living                      Prior Function  PT Goals (current goals can now be found in the care plan section) Progress towards PT goals: Progressing toward goals    Frequency    Min 4X/week      PT Plan Current plan remains appropriate    Co-evaluation PT/OT/SLP Co-Evaluation/Treatment: Yes Reason for Co-Treatment: For patient/therapist safety;To address functional/ADL transfers PT goals addressed during session: Mobility/safety with mobility;Strengthening/ROM;Balance        AM-PAC PT "6 Clicks" Daily Activity  Outcome Measure  Difficulty turning over in bed (including adjusting bedclothes, sheets  and blankets)?: A Lot Difficulty moving from lying on back to sitting on the side of the bed? : Unable Difficulty sitting down on and standing up from a chair with arms (e.g., wheelchair, bedside commode, etc,.)?: Unable Help needed moving to and from a bed to chair (including a wheelchair)?: A Lot Help needed walking in hospital room?: Total Help needed climbing 3-5 steps with a railing? : Total 6 Click Score: 8    End of Session Equipment Utilized During Treatment: Gait belt Activity Tolerance: Patient tolerated treatment well Patient left: in chair;with call bell/phone within reach;with chair alarm set Nurse Communication: Mobility status PT Visit Diagnosis: Other abnormalities of gait and mobility (R26.89);Hemiplegia and hemiparesis Hemiplegia - Right/Left: Right Hemiplegia - dominant/non-dominant: Dominant Hemiplegia - caused by: Cerebral infarction     Time: 5400-8676 PT Time Calculation (min) (ACUTE ONLY): 44 min  Charges:  $Therapeutic Activity: 23-37 mins                    G Codes:       Earney Navy, PTA Pager: [phone]     Darliss Cheney 09/13/2017, 9:42 AM

## 2017-09-13 NOTE — Progress Notes (Signed)
Occupational Therapy Treatment Patient Details Name: Dillon Garcia MRN: [id number] DOB: February 02, 1956 Today's Date: 09/13/2017    History of present illness 62 yo M with hx HTN, HLD, CAD, GERD, and Active tobacco abuse, presents with an acute CVA.  MRI of brain showed Acute LEFT greater than RIGHT pontocerebellar nonhemorrhagic ast medical history of GERD (gastroesophageal reflux disease), Heart disease, HTN (hypertension), Hyperlipidemia, Hypothyroid, Infectious endocarditis (2012), and Mitral valve disease.  Past surgical history that includes Median sternotomy and Mitral valve replacement.   OT comments  Pt seen specifically for visual assessment and diplopia. Pt with decreased right eye tracking fluidity and reports of double vision which goes away with closing of right eye. Attempted nasal side taping on glasses, however - Pt reports continued double vision - so entire R lens taped. Pt also reports fatigue in eye so eye patch also provided - encouraged Pt to use glasses during mobility and self-feeding as it gives him better depth perception and peripheral vision. Pt nodded understanding and agreement. OT will continue to follow in acute setting and CIR remains appropriate for this highly motivated individual.   Follow Up Recommendations  CIR;Supervision/Assistance - 24 hour    Equipment Recommendations  None recommended by OT    Recommendations for Other Services Rehab consult    Precautions / Restrictions Precautions Precautions: Fall Precaution Comments: right side weakness, dizziness, diplopia Restrictions Weight Bearing Restrictions: No       Mobility Bed Mobility Overal bed mobility: Needs Assistance Bed Mobility: Supine to Sit Rolling: Min assist Sidelying to sit: Mod assist   Sit to supine: Max assist;+2 for physical assistance   General bed mobility comments: cues for sequencing and to use bed rail to assist with rolling toward R side; assistance required to bring R LE  to EOB and to elevate trunk into sitting  Transfers Overall transfer level: Needs assistance Equipment used: (chair back) Transfers: Sit to/from [facility] Sit to Stand: Mod assist;+2 physical assistance Stand pivot transfers: Mod assist;+2 physical assistance       General transfer comment: NT this session    Balance Overall balance assessment: Needs assistance Sitting-balance support: Single extremity supported;Feet supported Sitting balance-Leahy Scale: Poor   Postural control: Posterior lean;Right lateral lean Standing balance support: Single extremity supported Standing balance-Leahy Scale: Poor                             ADL either performed or assessed with clinical judgement   ADL Overall ADL's : Needs assistance/impaired Eating/Feeding: Maximal assistance;Bed level Eating/Feeding Details (indicate cue type and reason): sitting in bed - NT feeding when therapy entered Grooming: Minimal assistance;Wash/dry face;Sitting Grooming Details (indicate cue type and reason): improved coordination with LUE                 Toilet Transfer: Moderate assistance;+2 for safety/equipment;Stand-pivot             General ADL Comments: Pt pleasant and motivated throughout session     Vision   Vision Assessment?: Yes Eye Alignment: Impaired (comment) Ocular Range of Motion: Restricted on the right Alignment/Gaze Preference: Within Defined Limits Tracking/Visual Pursuits: Unable to hold eye position out of midline;Decreased smoothness of horizontal tracking(R eye specifically) Convergence: Impaired - to be further tested in functional context Diplopia Assessment: Disappears with one eye closed;Present all the time/all directions Additional Comments: eye patch and occluded glasses provided for Pt with PA/MD verbal order   Perception  Praxis      Cognition Arousal/Alertness: Awake/alert Behavior During Therapy: WFL for tasks  assessed/performed Overall Cognitive Status: Difficult to assess Area of Impairment: Following commands                       Following Commands: Follows multi-step commands with increased time       General Comments: pt responds appropriately with head nods and thumbs up/down        Exercises     Shoulder Instructions       General Comments Pt continues to complain of double vision - patched eye    Pertinent Vitals/ Pain       Pain Assessment: Faces Faces Pain Scale: No hurt Pain Intervention(s): Monitored during session  Home Living                                          Prior Functioning/Environment              Frequency  Min 2X/week        Progress Toward Goals  OT Goals(current goals can now be found in the care plan section)  Progress towards OT goals: Progressing toward goals  Acute Rehab OT Goals Patient Stated Goal: difficulty speakin, low tone, hushed OT Goal Formulation: Patient unable to participate in goal setting Time For Goal Achievement: 09/19/17 Potential to Achieve Goals: Good  Plan Discharge plan remains appropriate    Co-evaluation    PT/OT/SLP Co-Evaluation/Treatment: Yes Reason for Co-Treatment: For patient/therapist safety;To address functional/ADL transfers PT goals addressed during session: Mobility/safety with mobility;Balance;Strengthening/ROM OT goals addressed during session: ADL's and self-care;Other (comment)(vision)      AM-PAC PT "6 Clicks" Daily Activity     Outcome Measure   Help from another person eating meals?: A Lot Help from another person taking care of personal grooming?: A Lot Help from another person toileting, which includes using toliet, bedpan, or urinal?: A Lot Help from another person bathing (including washing, rinsing, drying)?: A Lot Help from another person to put on and taking off regular upper body clothing?: A Lot Help from another person to put on and taking  off regular lower body clothing?: Total 6 Click Score: 11    End of Session Equipment Utilized During Treatment: Other (comment)(eye patch and occluded glasses)  OT Visit Diagnosis: Hemiplegia and hemiparesis Hemiplegia - Right/Left: Right Hemiplegia - dominant/non-dominant: Dominant Hemiplegia - caused by: Cerebral infarction   Activity Tolerance Patient tolerated treatment well   Patient Left in bed;with call bell/phone within reach;with SCD's reapplied   Nurse Communication Mobility status;Precautions        Time: 4696-2952 OT Time Calculation (min): 16 min  Charges: OT General Charges $OT Visit: 1 Visit OT Treatments $Self Care/Home Management : 8-22 mins $Therapeutic Activity: 8-22 mins  Hulda Humphrey OTR/L [place] 09/13/2017, 3:46 PM

## 2017-09-13 NOTE — Progress Notes (Signed)
  Speech Language Pathology Treatment: Dysphagia;Cognitive-Linquistic  Patient Details Name: Dillon Garcia MRN: [id number] DOB: 1955/04/26 Today's Date: 09/13/2017 Time: 1040-1140 SLP Time Calculation (min) (ACUTE ONLY): 60 min  Assessment / Plan / Recommendation Clinical Impression  Session targeted pt education and awareness of deficits, thickening strategy, and need for appropriate hydration given pts communication and swallowing deficits. Pt continues to cough intermittently and weakly on secretions, but is using oral suction adequately. Honey thick liquids tolerated via self feeding, discussed findings and needs with RN. Initiated IMST on lowest setting with max tactile assist for labial closure. Pt with max effort intermittently successful in pulling air through device. Unable with EMST 150 (respironics device not available). Encourage increased breath support for volume with sustained phonation, repetition of bilabial syllables. Pt particularly struggled with lingual tip elevation for /t/ and /d/ but can use mandibular elevation for /s/. Intelligibility severely compromised given dysphonia. Will continue efforts.   HPI HPI: 89 yoM with hx HTN, HLD, CAD, GERD, and active tobacco abuse, presents with an acute CVA.  MRI of brain showed acute bilateral (larger left, smaller right) cerebellar nonhemorrhagic infarcts, left pontine infarct. Intubated 5/20-5/23.      SLP Plan  Continue with current plan of care       Recommendations  Diet recommendations: Dysphagia 1 (puree);Honey-thick liquid Liquids provided via: Cup Medication Administration: Crushed with puree Supervision: Staff to assist with self feeding Compensations: Slow rate;Small sips/bites                Plan: Continue with current plan of care       GO                Khamiya Varin, Katherene Ponto 09/13/2017, 12:17 PM

## 2017-09-13 NOTE — Progress Notes (Signed)
RN spoke to [employer] and SLP, B. Deblois regarding removal of pt's cortrak. Both parties agreed for RN to remove line at this time.    Ave Filter, RN

## 2017-09-13 NOTE — Progress Notes (Signed)
Occupational Therapy Treatment Patient Details Name: Dillon Garcia MRN: [id number] DOB: Nov 15, 1955 Today's Date: 09/13/2017    History of present illness 62 yo M with hx HTN, HLD, CAD, GERD, and Active tobacco abuse, presents with an acute CVA.  MRI of brain showed Acute LEFT greater than RIGHT pontocerebellar nonhemorrhagic ast medical history of GERD (gastroesophageal reflux disease), Heart disease, HTN (hypertension), Hyperlipidemia, Hypothyroid, Infectious endocarditis (2012), and Mitral valve disease.  Past surgical history that includes Median sternotomy and Mitral valve replacement.   OT comments  Pt progressing towards OT goals this session, able to participate in transfer to chair with mod to max A +2 assist. Pt with improved coordination in LUE for use of oral care, seated grooming tasks. Pt smiles and is very motivated to work with therapies. CIR continues to be appropriate to maximize safety and independence in ADL.   Follow Up Recommendations  CIR;Supervision/Assistance - 24 hour    Equipment Recommendations  None recommended by OT    Recommendations for Other Services Rehab consult    Precautions / Restrictions Precautions Precautions: Fall Precaution Comments: right side weakness, dizziness, diplopia Restrictions Weight Bearing Restrictions: No       Mobility Bed Mobility Overal bed mobility: Needs Assistance Bed Mobility: Supine to Sit Rolling: Min assist Sidelying to sit: Mod assist   Sit to supine: Max assist;+2 for physical assistance   General bed mobility comments: cues for sequencing and to use bed rail to assist with rolling toward R side; assistance required to bring R LE to EOB and to elevate trunk into sitting  Transfers Overall transfer level: Needs assistance Equipment used: (chair back) Transfers: Sit to/from [facility] Sit to Stand: Mod assist;+2 physical assistance Stand pivot transfers: Mod assist;+2 physical assistance        General transfer comment: pt stood X 3 from EOB; attempted marching in place with assist to weight shift; pt assisted to stand with L UE/LE and with R knee blocked and R UE supported; multimodal cues for upright posture and facilitation at glutes to bring hips anterior     Balance Overall balance assessment: Needs assistance Sitting-balance support: Single extremity supported;Feet supported Sitting balance-Leahy Scale: Poor   Postural control: Posterior lean;Right lateral lean Standing balance support: Single extremity supported Standing balance-Leahy Scale: Poor                             ADL either performed or assessed with clinical judgement   ADL Overall ADL's : Needs assistance/impaired Eating/Feeding: Maximal assistance;Bed level Eating/Feeding Details (indicate cue type and reason): sitting in bed - NT feeding when therapy entered Grooming: Minimal assistance;Wash/dry face;Sitting Grooming Details (indicate cue type and reason): improved coordination with LUE                 Toilet Transfer: Moderate assistance;+2 for safety/equipment;Stand-pivot             General ADL Comments: Pt pleasant and motivated throughout session     Vision       Perception     Praxis      Cognition Arousal/Alertness: Awake/alert Behavior During Therapy: WFL for tasks assessed/performed Overall Cognitive Status: Impaired/Different from baseline Area of Impairment: Following commands                       Following Commands: Follows multi-step commands with increased time  Exercises     Shoulder Instructions       General Comments Pt continues to complain of double vision - patched eye    Pertinent Vitals/ Pain       Pain Assessment: Faces Faces Pain Scale: No hurt Pain Intervention(s): Monitored during session  Home Living                                          Prior Functioning/Environment               Frequency  Min 2X/week        Progress Toward Goals  OT Goals(current goals can now be found in the care plan section)  Progress towards OT goals: Progressing toward goals  Acute Rehab OT Goals Patient Stated Goal: difficulty speakin, low tone, hushed OT Goal Formulation: Patient unable to participate in goal setting Time For Goal Achievement: 09/19/17 Potential to Achieve Goals: Good  Plan Discharge plan remains appropriate    Co-evaluation    PT/OT/SLP Co-Evaluation/Treatment: Yes Reason for Co-Treatment: For patient/therapist safety;To address functional/ADL transfers PT goals addressed during session: Mobility/safety with mobility;Balance;Strengthening/ROM OT goals addressed during session: ADL's and self-care;Strengthening/ROM      AM-PAC PT "6 Clicks" Daily Activity     Outcome Measure   Help from another person eating meals?: A Lot Help from another person taking care of personal grooming?: A Lot Help from another person toileting, which includes using toliet, bedpan, or urinal?: A Lot Help from another person bathing (including washing, rinsing, drying)?: A Lot Help from another person to put on and taking off regular upper body clothing?: A Lot Help from another person to put on and taking off regular lower body clothing?: Total 6 Click Score: 11    End of Session Equipment Utilized During Treatment: Gait belt  OT Visit Diagnosis: Hemiplegia and hemiparesis Hemiplegia - Right/Left: Right Hemiplegia - dominant/non-dominant: Dominant Hemiplegia - caused by: Cerebral infarction   Activity Tolerance Patient tolerated treatment well   Patient Left in chair;with call bell/phone within reach;with nursing/sitter in room   Nurse Communication Mobility status;Precautions        Time: 5409-8119 OT Time Calculation (min): 44 min  Charges: OT General Charges $OT Visit: 1 Visit OT Treatments $Self Care/Home Management : 8-22 mins  Hulda Humphrey OTR/L [place] 09/13/2017, 3:30 PM

## 2017-09-13 NOTE — Care Management Note (Signed)
Case Management Note  Patient Details  Name: NISHANTH MCCAUGHAN MRN: [id number] Date of Birth: [birth year]  Subjective/Objective:                    Action/Plan: Plan is SNF when ready for d/c. CM following for d/c disposition.   Expected Discharge Date:                  Expected Discharge Plan:  [place]  In-House Referral:     Discharge planning Services  CM Consult  Post Acute Care Choice:    Choice offered to:     DME Arranged:    DME Agency:     HH Arranged:    [place] Agency:     Status of Service:  In process, will continue to follow  If discussed at Long Length of Stay Meetings, dates discussed:    Additional Comments:  Pollie Friar, RN 09/13/2017, 12:39 PM

## 2017-09-14 LAB — CBC
HCT: 42.4 % (ref 39.0–52.0)
Hemoglobin: 13.9 g/dL (ref 13.0–17.0)
MCH: 29.2 pg (ref 26.0–34.0)
MCHC: 32.8 g/dL (ref 30.0–36.0)
MCV: 89.1 fL (ref 78.0–100.0)
PLATELETS: 214 10*3/uL (ref 150–400)
RBC: 4.76 MIL/uL (ref 4.22–5.81)
RDW: 14 % (ref 11.5–15.5)
WBC: 9.8 10*3/uL (ref 4.0–10.5)

## 2017-09-14 LAB — BASIC METABOLIC PANEL
Anion gap: 6 (ref 5–15)
BUN: 10 mg/dL (ref 6–20)
CO2: 22 mmol/L (ref 22–32)
CREATININE: 0.82 mg/dL (ref 0.61–1.24)
Calcium: 8.8 mg/dL — ABNORMAL LOW (ref 8.9–10.3)
Chloride: 111 mmol/L (ref 101–111)
GFR calc Af Amer: >60 mL/min (ref >60)
Glucose, Bld: 95 mg/dL (ref 65–99)
Potassium: 3.9 mmol/L (ref 3.5–5.1)
SODIUM: 139 mmol/L (ref 135–145)

## 2017-09-14 LAB — GLUCOSE, CAPILLARY: GLUCOSE-CAPILLARY: 84 mg/dL (ref 65–99)

## 2017-09-14 NOTE — Clinical Social Work Note (Signed)
Clinical Social Work Assessment  Patient Details  Name: Dillon Garcia MRN: [id number] Date of Birth: 10/10/1955  Date of referral:  09/14/17               Reason for consult:  Facility Placement                Permission sought to share information with:  Facility Sport and exercise psychologist, Family Supports Permission granted to share information::  Yes, Verbal Permission Granted  Name::     Retail banker::  SNF  Relationship::  Brother  Contact Information:     Housing/Transportation Living arrangements for the past 2 months:  Single Family Home Source of Information:  Siblings Patient Interpreter Needed:  None Criminal Activity/Legal Involvement Pertinent to Current Situation/Hospitalization:  No - Comment as needed Significant Relationships:  Siblings Lives with:  Self Do you feel safe going back to the place where you live?  Yes Need for family participation in patient care:  Yes (Comment)  Care giving concerns:  Patient from home alone and will benefit from short term rehab at discharge.   Social Worker assessment / plan:  CSW discussed recommendation for SNF with patient's brother, and discussed facility options. CSW to follow up with referral and check on preferences.  Employment status:  Retired Nurse, adult PT Recommendations:  [place] / Referral to community resources:  [place]  Patient/Family's Response to care:  Patient's brother agreeable to SNF placement.  Patient/Family's Understanding of and Emotional Response to Diagnosis, Current Treatment, and Prognosis:  Patient's brother understands that patient needs some additional help right now, and is hopeful that he might eventually be able to recover and come home. Patient's brother would like him located closer to where he is in [place].   Emotional Assessment Appearance:  Appears stated age Attitude/Demeanor/Rapport:  Unable to Assess Affect  (typically observed):  Unable to Assess Orientation:  Oriented to Self Alcohol / Substance use:  Not Applicable Psych involvement (Current and /or in the community):  No (Comment)  Discharge Needs  Concerns to be addressed:  Care Coordination Readmission within the last 30 days:  No Current discharge risk:  Dependent with Mobility, Lives alone Barriers to Discharge:  Continued Medical Work up, [place], [place] 09/14/2017, 3:30 PM

## 2017-09-14 NOTE — Progress Notes (Signed)
CM spoke to patients brother, [name], over the phone about d/c planning. Danny asked that patient go to [facility] in [place]. They did not offer a bed to the patient. [facility] in [place] offered. Danny in agreement with [facility]. CSW updated. CM following.

## 2017-09-14 NOTE — Progress Notes (Signed)
Physical Therapy Treatment Patient Details Name: Dillon Garcia MRN: [id number] DOB: 1956/01/24 Today's Date: 09/14/2017    History of Present Illness 62 yo M with hx HTN, HLD, CAD, GERD, and Active tobacco abuse, presents with an acute CVA.  MRI of brain showed Acute LEFT greater than RIGHT pontocerebellar nonhemorrhagic ast medical history of GERD (gastroesophageal reflux disease), Heart disease, HTN (hypertension), Hyperlipidemia, Hypothyroid, Infectious endocarditis (2012), and Mitral valve disease.  Past surgical history that includes Median sternotomy and Mitral valve replacement.    PT Comments    Patient continues to make gradual progress toward PT goals. Continue to progress as tolerated.    Follow Up Recommendations  CIR     Equipment Recommendations  Wheelchair (measurements PT);Wheelchair cushion (measurements PT)    Recommendations for Other Services Rehab consult     Precautions / Restrictions Precautions Precautions: Fall Precaution Comments: right side weakness, dizziness, diplopia Restrictions Weight Bearing Restrictions: No    Mobility  Bed Mobility Overal bed mobility: Needs Assistance Bed Mobility: Rolling;Sidelying to Sit Rolling: Min assist Sidelying to sit: Mod assist       General bed mobility comments: pt began initiating task when asked to sit on the EOB and required cues for reaching with L hand for the bed rail; assistance needed to bring R LE to EOB and then bring trunk into sitting all the way however pt is beginning to get closer on his own before requiring assistance  Transfers Overall transfer level: Needs assistance   Transfers: Sit to/from Stand;Stand Pivot Transfers Sit to Stand: Mod assist Stand pivot transfers: Mod assist;+2 safety/equipment       General transfer comment: sit to stands X3 with mod A for powering up into standing and for balance upon stand; pt able to pivot L foot without assistance and R knee blocked for  safety  Ambulation/Gait                 Stairs             Wheelchair Mobility    Modified Rankin (Stroke Patients Only) Modified Rankin (Stroke Patients Only) Pre-Morbid Rankin Score: No symptoms Modified Rankin: Severe disability     Balance Overall balance assessment: Needs assistance Sitting-balance support: Single extremity supported;Feet supported Sitting balance-Leahy Scale: Poor   Postural control: Posterior lean;Right lateral lean Standing balance support: Single extremity supported Standing balance-Leahy Scale: Poor                              Cognition Arousal/Alertness: Awake/alert Behavior During Therapy: WFL for tasks assessed/performed Overall Cognitive Status: Impaired/Different from baseline Area of Impairment: Following commands                       Following Commands: Follows multi-step commands with increased time              Exercises      General Comments        Pertinent Vitals/Pain Pain Assessment: No/denies pain    Home Living                      Prior Function            PT Goals (current goals can now be found in the care plan section) Progress towards PT goals: Progressing toward goals    Frequency    Min 4X/week      PT Plan Current plan remains  appropriate    Co-evaluation              AM-PAC PT "6 Clicks" Daily Activity  Outcome Measure  Difficulty turning over in bed (including adjusting bedclothes, sheets and blankets)?: A Lot Difficulty moving from lying on back to sitting on the side of the bed? : Unable Difficulty sitting down on and standing up from a chair with arms (e.g., wheelchair, bedside commode, etc,.)?: Unable Help needed moving to and from a bed to chair (including a wheelchair)?: A Lot Help needed walking in hospital room?: A Lot Help needed climbing 3-5 steps with a railing? : Total 6 Click Score: 9    End of Session Equipment Utilized  During Treatment: Gait belt Activity Tolerance: Patient tolerated treatment well Patient left: with call bell/phone within reach;in chair;with chair alarm set Nurse Communication: Mobility status PT Visit Diagnosis: Other abnormalities of gait and mobility (R26.89);Hemiplegia and hemiparesis Hemiplegia - Right/Left: Right Hemiplegia - dominant/non-dominant: Dominant Hemiplegia - caused by: Cerebral infarction     Time: 1021-1100 PT Time Calculation (min) (ACUTE ONLY): 39 min  Charges:  $Therapeutic Activity: 38-52 mins                    G Codes:       Earney Navy, PTA Pager: [phone]     Darliss Cheney 09/14/2017, 3:23 PM

## 2017-09-14 NOTE — Progress Notes (Addendum)
Nutrition Follow-up  DOCUMENTATION CODES:   Non-severe (moderate) malnutrition in context of chronic illness  INTERVENTION:  Magic cup TID with meals, each supplement provides 290 kcal and 9 grams of protein  MVI w/ Minerals  Calorie Count ordered per MD, envelope hung on door, entered care order for nursing. Discussed with RN.  NUTRITION DIAGNOSIS:   Moderate Malnutrition related to chronic illness as evidenced by moderate muscle depletion, moderate fat depletion. -ongoing  GOAL:   Patient will meet greater than or equal to 90% of their needs -unmet  MONITOR:   PO intake, Diet advancement, Supplement acceptance  ASSESSMENT:   Pt with PMH of HTN, HLD, CAD, GERD, active tobacco abuse, hypothyroidism, MV disorder s/p cardiac valve replacement admitted with acute L cerebellar territory infarct. Pt intubated and transferred to [facility] from [facility].    He has since been extubated, transferred to floor. Working PT/OT Underwent FEES 5/28 - tolerating NDD1-HT liquids currently. Per SLP, unexpected to see his diet advanced before d/c Eating 50-75% at meals.  Ate 75% of Egg omelet, sausage, grits, apple juce and peaches for breakfast. Calorie Count started, envelope hung on door. Disposition: SNF  Labs reviewed Medications reviewed and include:  MVI liquid NS at 45mL/hr   Diet Order:   Diet Order           DIET - DYS 1 Room service appropriate? Yes; Fluid consistency: Honey Thick  Diet effective now          EDUCATION NEEDS:   No education needs have been identified at this time  Skin:  Skin Assessment: Reviewed RN Assessment  Last BM:  09/10/2017  Height:   Ht Readings from Last 1 Encounters:  09/12/17 5\' 8"  (1.727 m)    Weight:   Wt Readings from Last 1 Encounters:  09/13/17 140 lb 3.4 oz (63.6 kg)    Ideal Body Weight:  70 kg  BMI:  Body mass index is 21.32 kg/m.  Estimated Nutritional Needs:   Kcal:  1600-1800  Protein:  87-104  Fluid:  > 1.7  L/day  Dillon Garcia. Dillon Hedgepeth, MS, RD LDN Inpatient Clinical Dietitian Pager [phone]

## 2017-09-14 NOTE — Progress Notes (Signed)
  Speech Language Pathology Treatment: Dysphagia;Cognitive-Linquistic  Patient Details Name: Dillon Garcia MRN: [id number] DOB: 1955/07/20 Today's Date: 09/14/2017 Time: 6269-4854 SLP Time Calculation (min) (ACUTE ONLY): 25 min  Assessment / Plan / Recommendation Clinical Impression  Session focused on respiratory muscle training and use of trainers.  Pt with improved ability to maintain labial seal during expiratory/inhalation trials.  IMST remains on lowest setting with min-mod tactile assist; max verbal cues to slow rate and demonstrate more control over process.  Pt with persisting copious saliva -  Educated re: increasing frequency of deliberate swallows.  Pt uses oral suctioning independently.  He presents with improved intelligibility of whispered speech, but remains at around 60% clarity.  Unable to achieve phonation except when expending great effort during short expirations.  Mod cues needed for deep inhalation, oral exhalation with sustained "ah" for a minimum of four seconds.  Pt is tolerating a dysphagia 1 diet with honey-thick liquids - given poor vocal fold adduction per 5/28 FEES and delayed airway protection, doubt diet will be able to be advanced prior to D/C to SNF.  Pt will need to consume adequate volumes of honey thick liquid in order to maintain hydration.  SLP will continue to follow pending D/C.   HPI HPI: 69 yoM with hx HTN, HLD, CAD, GERD, and active tobacco abuse, presents with an acute CVA.  MRI of brain showed acute bilateral (larger left, smaller right) cerebellar nonhemorrhagic infarcts, left pontine infarct. Intubated 5/20-5/23.      SLP Plan  Continue with current plan of care       Recommendations  Diet recommendations: Dysphagia 1 (puree);Honey-thick liquid Liquids provided via: Cup Medication Administration: Crushed with puree Supervision: Staff to assist with self feeding Compensations: Slow rate;Small sips/bites Postural Changes and/or Swallow Maneuvers:  Seated upright 90 degrees                Oral Care Recommendations: Oral care BID SLP Visit Diagnosis: Dysphagia, oropharyngeal phase (R13.12) Plan: Continue with current plan of care       GO                [name] 09/14/2017, 3:48 PM

## 2017-09-14 NOTE — Progress Notes (Addendum)
STROKE TEAM PROGRESS NOTE   SUBJECTIVE (INTERVAL HISTORY) Working with therapy to get out of bed into the chair.  Ate 100% of breakfast per nurse.  Panda tube now out.  Approaching readiness for SNF.  OBJECTIVE Temp:  [98.4 F (36.9 C)-98.8 F (37.1 C)] 98.8 F (37.1 C) (05/30 1300) Pulse Rate:  [73-126] 73 (05/30 0325) Cardiac Rhythm: Normal sinus rhythm (05/30 0700) Resp:  [17-21] 17 (05/30 0325) BP: (122-156)/(87-103) 122/92 (05/30 1300) SpO2:  [96 %-100 %] 96 % (05/30 0325)  Recent Labs  Lab 09/12/17 1113 09/12/17 1631 09/12/17 2026 09/13/17 0000 09/13/17 1146  GLUCAP 111* 95 108* 89 127*   Recent Labs  Lab 09/07/17 1620  09/08/17 0415  09/08/17 0923 09/08/17 1700  09/09/17 0524 09/09/17 0906 09/10/17 0445 09/11/17 0633 09/14/17 0650  NA 153*   < > 153*   < >  --   --    < > 148* 149* 151* 149* 139  K  --   --  3.5  --   --   --   --  3.1*  --  3.3* 3.9 3.9  CL  --   --  114*  --   --   --   --  112*  --  116* 117* 111  CO2  --   --  29  --   --   --   --  27  --  29 26 22   GLUCOSE  --   --  95  --   --   --   --  155*  --  94 127* 95  BUN  --   --  8  --   --   --   --  18  --  14 12 10   CREATININE  --   --  1.02  --   --   --   --  1.02  --  0.94 0.85 0.82  CALCIUM  --    < > 9.4  --   --   --   --  9.0  --  8.9 9.2 8.8*  MG 2.0  --   --   --  1.7 1.6*  --   --   --   --   --   --   PHOS 3.2  --   --   --  4.1 4.0  --   --   --   --   --   --    < > = values in this interval not displayed.    Recent Labs  Lab 09/08/17 0415 09/09/17 0524 09/10/17 0445 09/11/17 0633 09/14/17 0650  WBC 10.4 8.7 7.8 8.2 9.8  NEUTROABS  --  6.0  --   --   --   HGB 14.6 15.2 14.0 13.7 13.9  HCT 46.2 47.2 44.4 43.0 42.4  MCV 93.1 90.6 90.6 91.3 89.1  PLT 151 154 160 155 214     PHYSICAL EXAM General - frail african [place] male  Cardiovascular - Regular rate and rhythm. Neuro - awake, alert, oriented to person, place and situation. Follows all simple commands. soft  hypophonic voice. Significant dysarthria. Secretions decreasing but still with drooling (seuctions self). PERRL, both eyes left gaze palsy, will cross midline with effort, b/l eye right, up and downward gaze with nystagmus. Double vision. Can count fingers all fields but decreased L peripheral vision. Significant right facial droop. Tongue midline in mouth. No aphasia. LUE 4+/5, LLE 4+/5 at least. No babinski.  RUE 0/5 and RLE 3-/5, with positive babinski. Sensation intact bilaterally. Decreased coordination left. Gait not tested.   ASSESSMENT/PLAN [name] is a 62 y.o. male with history of HTN and HLD admitted for unresponsive in car with questionable shaking episode, concerning for seizure. Also found to have right facial droop and was intubated for airway protection. No tPA given due to OSW.   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 cardioembolic  Resultant intubated, eye movement difficulty, right facial droop, right hemiplegia  MRI left SCA and left pontine large infarcts as well as punctate right PICA infarct  MRA  Left VA slow flow vs. Occlusion  EEG - normal  CTA neck left V4 and VA origin occlusion  CT repeat 5/24 evolving L>R cerebellar and L pontine infarcts. Mild mass effect 4th vent but no hydrocephalus. New sm L SAH vs thrombosed vein. Given questionable hmg vs slow flow cortical vein, will hold plavix for now and repeat CT Monday.  CT repeat 5/27 - stable  2D Echo EF 65-70%  TEE and Loop - consider as an OP when respiratory more stable   LE venous doppler no DVT  LDL 17  HgbA1c 5.5  Heparin subq for VTE prophylaxis  aspirin 81 mg daily prior to admission, now on aspirin 325 mg daily. Given hmg continue to holding plavix for now, continue aspirin   Ongoing aggressive stroke risk factor management  Therapy recommendations:  CIR (but lives alone and has  no one to care for following) ->SNF, wheel chair and w/c cushion  Disposition:  Pending.  SNF bed available tomorrow after insurance approval obtained.  Anticipate discharge at that time.  Seizure-like activity (on admission)  Doubt the "shaking" saw by EMS was seizure  Could be due to limb posturing due to brain stem infarct  EEG normal   No need AED this time  Cerebellar edema, induced hypernatremia, resolved  MRI showed large left cerebellar infarct  Repeat CT stable no hydrocephalus  Repeat CT no hydrocephalus  Off 3% saline due to high Na   NA 139 today  Respiratory distress  Intubated, now extubated.  Lots of secretions, high aspiration risk  Dysphagia  Secondary to stroke Diet Order           DIET - DYS 1 Room service appropriate? Yes; Fluid consistency: Honey Thick  Diet effective now         panda remains in. TF not running. SLP will let us know when ok to remove  Coughs intermittently, working on self feeds  Hypertension Elevated 157/116 Keep SBP goal < 180 Long term BP goal normotensive  Hyperlipidemia  Home meds:  lipitor 40   LDL 17, goal < 70  Now lipitor down to 10 mg  Continue lipitor 10 on discharge  Other Stroke Risk Factors  Advanced age  Other Active Problems  Leukocytosis, resolved  AKI, resolved   High aspiration risk  Hospital day # [place], MSN, APRN, ANVP-BC, AGPCNP-BC Advanced Practice Stroke Nurse [place] for Schedule & Pager information 09/14/2017 1:36 PM  The patient's condition appears stable. He is eating well and feeding tube has been discontinued. Await transfer for rehabilitation in the next 1-2 days. Antony Contras, MD To contact Stroke Continuity provider, please refer to [email or web address] After hours, contact General Neurology

## 2017-09-15 LAB — GLUCOSE, CAPILLARY: Glucose-Capillary: 122 mg/dL — ABNORMAL HIGH (ref 65–99)

## 2017-09-15 MED ORDER — PROSIGHT PO TABS
1.0000 | ORAL_TABLET | Freq: Every day | ORAL | Status: DC
  Administered 2017-09-15: 1 via ORAL
  Filled 2017-09-15: qty 1

## 2017-09-15 MED ORDER — FAMOTIDINE 20 MG PO TABS: 20.0000 mg | ORAL_TABLET | Freq: Two times a day (BID) | ORAL | 6 refills | Status: DC

## 2017-09-15 MED ORDER — SENNOSIDES-DOCUSATE SODIUM 8.6-50 MG PO TABS: 1.0000 | ORAL_TABLET | Freq: Every evening | ORAL | 2 refills | Status: DC | PRN

## 2017-09-15 MED ORDER — ATORVASTATIN CALCIUM 10 MG PO TABS: 10.0000 mg | ORAL_TABLET | Freq: Every day | ORAL | 6 refills | Status: DC

## 2017-09-15 MED ORDER — RESOURCE THICKENUP CLEAR PO POWD: ORAL | 6 refills | Status: DC

## 2017-09-15 MED ORDER — ATORVASTATIN CALCIUM 40 MG PO TABS: 40.0000 mg | ORAL_TABLET | Freq: Every day | ORAL | 6 refills | Status: DC

## 2017-09-15 MED ORDER — ATORVASTATIN CALCIUM 10 MG PO TABS: 10.0000 mg | ORAL_TABLET | Freq: Every day | ORAL | Status: DC

## 2017-09-15 MED ORDER — PROSIGHT PO TABS: 1.0000 | ORAL_TABLET | Freq: Every day | ORAL | 0 refills | Status: DC

## 2017-09-15 MED ORDER — ASPIRIN 325 MG PO TABS: 325.0000 mg | ORAL_TABLET | Freq: Every day | ORAL | 11 refills | Status: DC

## 2017-09-15 MED ORDER — ACETAMINOPHEN 325 MG PO TABS: 650.0000 mg | ORAL_TABLET | ORAL | Status: DC | PRN

## 2017-09-15 MED ORDER — FAMOTIDINE 20 MG PO TABS
20.0000 mg | ORAL_TABLET | Freq: Two times a day (BID) | ORAL | Status: DC
  Administered 2017-09-15: 20 mg via ORAL
  Filled 2017-09-15: qty 1

## 2017-09-15 NOTE — Care Management Note (Signed)
Case Management Note  Patient Details  Name: Dillon Garcia MRN: [id number] Date of Birth: Jul 26, 1955  Subjective/Objective:                    Action/Plan: Pt discharging to [facility] today. CM signing off.   Expected Discharge Date:  09/15/17               Expected Discharge Plan:  Skilled Nursing Facility  In-House Referral:  Clinical Social Work  Discharge planning Services  CM Consult  Post Acute Care Choice:    Choice offered to:     DME Arranged:    DME Agency:     HH Arranged:    [place] Agency:     Status of Service:  Completed, signed off  If discussed at [employer], dates discussed:    Additional Comments:  Pollie Friar, RN 09/15/2017, 4:11 PM

## 2017-09-15 NOTE — Clinical Social Work Note (Signed)
Clinical Social Worker facilitated patient discharge including contacting patient family and facility to confirm patient discharge plans.  Clinical information faxed to facility and family agreeable with plan.  CSW arranged ambulance transport via [facility] to [facility].  RN to call [phone] for report prior to discharge.  Clinical Social Worker will sign off for now as social work intervention is no longer needed. Please consult [place] again if new need arises.  [place], [place]

## 2017-09-15 NOTE — Clinical Social Work Note (Signed)
Clinical Social Worker facilitated patient discharge including contacting patient family and facility to confirm patient discharge plans.  Clinical information faxed to facility and family agreeable with plan.  CSW arranged ambulance transport via [facility] to [facility].  RN to call [phone] for report prior to discharge.  Clinical Social Worker will sign off for now as social work intervention is no longer needed. Please consult [place] again if new need arises.  [place], [place]

## 2017-09-15 NOTE — Discharge Summary (Addendum)
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 GERD (gastroesophageal reflux disease)   . Heart disease   . HTN (hypertension)   . Hyperlipidemia   . Hypothyroid   . Infectious endocarditis 2012  . Mitral valve disease 2012   Past Surgical History:  Procedure Laterality Date  . MEDIAN STERNOTOMY     CABG? valve replacement?  Marland Kitchen MITRAL VALVE REPLACEMENT  2012    Allergies as of 09/15/2017   No Known Allergies     Medication List    STOP taking these medications   aspirin EC 81 MG tablet Replaced by:  aspirin 325 MG tablet   lisinopril-hydrochlorothiazide 20-12.5 MG tablet Commonly known as:  PRINZIDE,ZESTORETIC   metoprolol succinate 100 MG 24 hr tablet Commonly known as:  TOPROL-XL     TAKE these medications   acetaminophen 325 MG tablet Commonly known as:  TYLENOL Take 2 tablets (650 mg total) by mouth every 4 (four) hours as needed for mild pain (or temp > 37.5 C (99.5 F)).   amLODipine 5 MG tablet Commonly known as:  NORVASC Take 10 mg by mouth daily.   aspirin 325 MG tablet Take 1 tablet (325 mg total) by mouth daily. Start taking on:  09/16/2017 Replaces:  aspirin EC 81 MG tablet    atorvastatin 10 MG tablet Commonly known as:  LIPITOR Take 1 tablet (10 mg total) by mouth daily at 6 PM.   famotidine 20 MG tablet Commonly known as:  PEPCID Take 1 tablet (20 mg total) by mouth 2 (two) times daily. What changed:    medication strength  how much to take  when to take this  reasons to take this   multivitamin Tabs tablet Take 1 tablet by mouth daily. Start taking on:  09/16/2017   RESOURCE THICKENUP CLEAR Powd Thicken liquids to honey thick texture   senna-docusate 8.6-50 MG tablet Commonly known as:  Senokot-S Take 1 tablet by mouth at bedtime as needed for mild constipation.       LABORATORY STUDIES CBC    Component Value Date/Time   WBC 9.8 09/14/2017 0650   RBC 4.76 09/14/2017 0650   HGB 13.9 09/14/2017 0650   HCT 42.4 09/14/2017 0650   PLT 214 09/14/2017 0650   MCV 89.1 09/14/2017 0650   MCH 29.2 09/14/2017 0650   MCHC 32.8 09/14/2017 0650   RDW 14.0 09/14/2017 0650   LYMPHSABS 1.4 09/09/2017 0524   MONOABS 1.0 09/09/2017 0524   EOSABS 0.2 09/09/2017 0524   BASOSABS 0.0 09/09/2017 0524   CMP    Component Value Date/Time   NA 139 09/14/2017 0650   K 3.9 09/14/2017 0650   CL 111 09/14/2017  0650   CO2 22 09/14/2017 0650   GLUCOSE 95 09/14/2017 0650   BUN 10 09/14/2017 0650   CREATININE 0.82 09/14/2017 0650   CALCIUM 8.8 (L) 09/14/2017 0650   PROT 5.8 (L) 09/04/2017 2118   ALBUMIN 3.5 09/04/2017 2118   AST 44 (H) 09/04/2017 2118   ALT 24 09/04/2017 2118   ALKPHOS 59 09/04/2017 2118   BILITOT 1.1 09/04/2017 2118   GFRNONAA >60 09/14/2017 0650   GFRAA >60 09/14/2017 0650   COAGS Lab Results  Component Value Date   INR 0.94 09/04/2017   Lipid Panel    Component Value Date/Time   CHOL 79 09/05/2017 0300   TRIG 102 09/05/2017 0300   HDL 42 09/05/2017 0300   CHOLHDL 1.9 09/05/2017 0300   VLDL 20 09/05/2017 0300   LDLCALC 17 09/05/2017 0300   HgbA1C  Lab Results  Component Value Date   HGBA1C 5.5 09/05/2017    Urinalysis    Component Value Date/Time   COLORURINE YELLOW (A) 09/04/2017 0602   APPEARANCEUR CLEAR (A) 09/04/2017 0602   LABSPEC 1.015 09/04/2017 0602   PHURINE 6.0 09/04/2017 0602   GLUCOSEU NEGATIVE 09/04/2017 0602   HGBUR MODERATE (A) 09/04/2017 0602   BILIRUBINUR NEGATIVE 09/04/2017 0602   KETONESUR 5 (A) 09/04/2017 0602   PROTEINUR NEGATIVE 09/04/2017 0602   NITRITE NEGATIVE 09/04/2017 0602   LEUKOCYTESUR NEGATIVE 09/04/2017 0602   Urine Drug Screen     Component Value Date/Time   LABOPIA NONE DETECTED 09/04/2017 2314   COCAINSCRNUR NONE DETECTED 09/04/2017 2314   COCAINSCRNUR NONE DETECTED 09/04/2017 0602   LABBENZ POSITIVE (A) 09/04/2017 2314   AMPHETMU NONE DETECTED 09/04/2017 2314   THCU NONE DETECTED 09/04/2017 2314   LABBARB NONE DETECTED 09/04/2017 2314    Alcohol Level    Component Value Date/Time   ETH <10 09/04/2017 0524     SIGNIFICANT DIAGNOSTIC STUDIES  Ct Head Wo Contrast  Result Date: 09/11/2017 CLINICAL DATA:  Stroke follow-up EXAM: CT HEAD WITHOUT CONTRAST TECHNIQUE: Contiguous axial images were obtained from the base of the skull through the vertex without intravenous contrast. COMPARISON:  09/08/2017 FINDINGS: Brain: Small focus of superior left convexity parafalcine subarachnoid hemorrhage is unchanged. Bilateral cerebellar infarcts, left-greater-than-right, and left pontine infarct are unchanged. There is mass effect in the posterior fossa but the cerebral aqueduct and fourth ventricle remain patent. No hydrocephalus. No new area of hemorrhage. Focus of hypoattenuation in the right frontal lobe is unchanged. Vascular: No abnormal hyperdensity of the major intracranial arteries or dural venous sinuses. No intracranial atherosclerosis. Skull: The visualized skull base, calvarium and extracranial soft tissues are normal. Sinuses/Orbits: No fluid levels or advanced mucosal thickening of the visualized paranasal sinuses. No mastoid or middle ear  effusion. The orbits are normal. IMPRESSION: 1. Unchanged appearance of cerebellar and pontine infarcts with persistent posterior fossa mass effect, but no obstructive hydrocephalus. 2. Unchanged small focus of subarachnoid hemorrhage over the parafalcine left convexity. Electronically Signed   By: Ulyses Jarred M.D.   On: 09/11/2017 02:57   Ct Head Wo Contrast  Result Date: 09/08/2017 CLINICAL DATA:  Follow-up stroke. EXAM: CT HEAD WITHOUT CONTRAST TECHNIQUE: Contiguous axial images were obtained from the base of the skull through the vertex without intravenous contrast. COMPARISON:  CT HEAD Sep 06, 2017 FINDINGS: BRAIN: Evolving acute large LEFT and small RIGHT cerebellar and LEFT pontine nonhemorrhagic infarcts. Similar mild mass effect on fourth ventricle and cerebral aqua duct which remain patent. No hydrocephalus. No intraparenchymal hemorrhage. Old small RIGHT frontal lobe  infarct. Nose supratentorial midline shift or mass effect. New linear density LEFT central sulcus extending to the superior sagittal sinus (axial image 24). Basal cisterns are patent. VASCULAR: Moderate calcific atherosclerosis of the carotid siphons. SKULL: No skull fracture. No significant scalp soft tissue swelling. SINUSES/ORBITS: Trace paranasal sinus mucosal thickening without air-fluid levels. Mastoid air cells are well aerated.The included ocular globes and orbital contents are non-suspicious. OTHER: None. IMPRESSION: 1. Evolving acute LEFT greater than RIGHT cerebellar and LEFT pontine nonhemorrhagic infarcts. Mild mass effect on fourth ventricle without hydrocephalus. 2. New small volume LEFT subarachnoid hemorrhage versus thrombosed cortical vein. Electronically Signed   By: Elon Alas M.D.   On: 09/08/2017 05:48   Ct Head Wo Contrast  Result Date: 09/06/2017 CLINICAL DATA:  Follow-up stroke. History of hypertension, hyperlipidemia, LEFT vertebral artery occlusion. EXAM: CT HEAD WITHOUT CONTRAST TECHNIQUE:  Contiguous axial images were obtained from the base of the skull through the vertex without intravenous contrast. COMPARISON:  CT HEAD Sep 05, 2017. FINDINGS: BRAIN: Evolving acute large LEFT and small RIGHT cerebellar and LEFT pontine nonhemorrhagic infarcts. Mild mass effect on the fourth ventricle and cerebral aqua duct which remain patent. No hydrocephalus. No intraparenchymal hemorrhage. Old small RIGHT frontal lobe better seen today. No parenchymal brain volume loss for age. No supratentorial midline shift or mass effect. No abnormal extra-axial fluid collections. Basal cisterns are patent. VASCULAR: Unremarkable. SKULL: No skull fracture. No significant scalp soft tissue swelling. SINUSES/ORBITS: Mild paranasal sinus mucosal thickening. Mastoid air cells are well aerated.The included ocular globes and orbital contents are non-suspicious. OTHER: None. IMPRESSION: 1. Evolving acute LEFT greater than RIGHT cerebellar and LEFT pontine nonhemorrhagic infarcts. Similar cytotoxic edema with mild mass effect on fourth ventricle. No hydrocephalus. Electronically Signed   By: Elon Alas M.D.   On: 09/06/2017 05:20   Ct Head Wo Contrast  Result Date: 09/05/2017 CLINICAL DATA:  Follow-up examination for acute stroke. EXAM: CT HEAD WITHOUT CONTRAST TECHNIQUE: Contiguous axial images were obtained from the base of the skull through the vertex without intravenous contrast. COMPARISON:  Prior CT and MRI from 09/04/2017. FINDINGS: Brain: Continued interval evolution of left greater than right acute ischemic nonhemorrhagic bilateral cerebellar infarcts, left greater than right, with large confluent left SCA territory infarct. Associated left pontine infarct noted as well, better seen on recent MRI. Overall, size and distribution relatively similar to previous. Mildly increased localized edema with increased partial effacement of the left perimesencephalic cistern. Fourth ventricle remains widely patent. No  hydrocephalus. No evidence for hemorrhagic transformation. Otherwise stable appearance of the brain. No other acute intracranial infarct or hemorrhage. No mass lesion or midline shift. No hydrocephalus. No extra-axial fluid collection. Vascular: No hyperdense vessel. Skull: Scalp soft tissues and calvarium within normal limits. Sinuses/Orbits: Globes and orbital soft tissues demonstrate no acute finding. Mucosal thickening throughout the ethmoidal air cells and maxillary sinuses with air-fluid level within the right maxillary sinus. Mastoid air cells remain clear. Other: None. IMPRESSION: 1. Continued interval evolution of acute ischemic left greater than right pontocerebellar infarcts, with slightly increased localized edema as compared to previous. Adjacent fourth ventricle remains patent without hydrocephalus. No evidence for hemorrhagic transformation or other complication. 2. No other new acute intracranial abnormality. Electronically Signed   By: Jeannine Boga M.D.   On: 09/05/2017 01:18   Ct Head Wo Contrast  Result Date: 09/04/2017 CLINICAL DATA:  Initial evaluation for acute unresponsiveness. EXAM: CT HEAD WITHOUT CONTRAST TECHNIQUE: Contiguous axial images were obtained from the base of the skull through  the vertex without intravenous contrast. COMPARISON:  None. FINDINGS: Brain: Generalized age-related cerebral atrophy. Mild chronic small vessel ischemic change present within the hemispheric cerebral white matter. Confluent hypodensity involving the superior left cerebellar hemisphere, consistent with evolving acute ischemic left superior cerebellar artery territory infarct. No associated hemorrhage. No significant mass effect at this time. Adjacent fourth ventricle remains patent. No inferior herniation. No other evidence for acute large vessel territory infarct. No intracranial hemorrhage. No mass lesion or midline shift. No hydrocephalus. No extra-axial fluid collection. Vascular: No  hyperdense vessel. Scattered vascular calcifications noted within the carotid siphons. Skull: Scalp soft tissues and calvarium within normal limits. Sinuses/Orbits: Globes and orbital soft tissues within normal limits. Small air-fluid level noted within the right maxillary sinus. Scattered mucosal thickening within the ethmoidal air cells. Visualized mastoids are clear. Other: None. IMPRESSION: 1. Evolving acute ischemic infarct involving the superior left cerebellar hemisphere, left superior cerebellar artery territory. No associated hemorrhage or significant mass effect at this time. 2. Age-related cerebral atrophy with mild chronic small vessel ischemic disease. Critical Value/emergent results were called by telephone at the time of interpretation on 09/04/2017 at 5:56 am to Dr. Lurline Hare , who verbally acknowledged these results. Electronically Signed   By: Jeannine Boga M.D.   On: 09/04/2017 05:56   Ct Angio Neck W Or Wo Contrast  Result Date: 09/05/2017 CLINICAL DATA:  62 y/o  M; follow-up of stroke. EXAM: CT ANGIOGRAPHY NECK TECHNIQUE: Multidetector CT imaging of the neck was performed using the standard protocol during bolus administration of intravenous contrast. Multiplanar CT image reconstructions and MIPs were obtained to evaluate the vascular anatomy. Carotid stenosis measurements (when applicable) are obtained utilizing NASCET criteria, using the distal internal carotid diameter as the denominator. CONTRAST:  52mL ISOVUE-370 IOPAMIDOL (ISOVUE-370) INJECTION 76% COMPARISON:  09/04/2017 MRI and MRA of the head. 09/05/2017 CT of the head. FINDINGS: Aortic arch: Bovine variant branching. Imaged portion shows no evidence of aneurysm or dissection. No significant stenosis of the major arch vessel origins. Mild calcific atherosclerosis. Right carotid system: No evidence of dissection, stenosis (50% or greater) or occlusion. Left carotid system: No evidence of dissection, stenosis (50% or greater) or  occlusion. Vertebral arteries: Left vertebral artery origin occlusion/near occlusion, short segment of patency of V1, occlusion of V2 to the C2 level, patent vertebral artery from the C2 level to the vertebrobasilar junction. Poor opacification of the distal V2, V3, and V4 segments, probably due to retrograde flow. No evidence of dissection, stenosis (50% or greater) or occlusion of right vertebral artery. Skeleton: C2-C5 posterior instrumented fusion and C3-4 interbody fusion with laminectomy. C7-T1 grade 1 anterolisthesis with prominent facet arthropathy. Other neck: Right central venous catheter tip extends into the SVC below the field of view. Enteric tube within the esophagus extending below the field of view. Endotracheal tube tip 4 cm above carina. Upper chest: Negative. IMPRESSION: 1. 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 2. Separate segment of left vertebral artery origin occlusion/near occlusion. 3. Patent distal left V2, V3, and V4 with hypoenhancement, probably representing retrograde flow. 4. Patent right vertebral and bilateral carotid systems without significant stenosis by NASCET criteria. Electronically Signed   By: Kristine Garbe M.D.   On: 09/05/2017 18:16   Mr Brain Wo Contrast  Result Date: 09/04/2017 CLINICAL DATA:  Follow-up LEFT cerebellar infarct. Found unresponsive in car at [employer]. Critically ill. History of hypertension. EXAM:  MRI HEAD WITHOUT CONTRAST MRA HEAD WITHOUT CONTRAST TECHNIQUE: Multiplanar, multiecho pulse sequences of the brain and surrounding structures were obtained without intravenous contrast. Angiographic images of the head were obtained using MRA technique without contrast. COMPARISON:  CT HEAD Sep 04, 2017 at 0527 hours FINDINGS: Multiple sequences are moderately motion degraded. MRI HEAD FINDINGS INTRACRANIAL CONTENTS: Confluent  reduced diffusion LEFT pons, LEFT cerebellum predominately involving the superior portion. Patchy reduced diffusion medial mesial RIGHT cerebellum. All areas of reduced diffusion demonstrate low ADC values and bright FLAIR signal. No susceptibility artifact to suggest hemorrhage. Fourth ventricle is open though, there is mild upward herniation and narrowed LEFT quadrigeminal cistern. No parenchymal brain volume loss for age. No supratentorial midline shift. No masses. No abnormal extra-axial fluid collections. VASCULAR: T2 bright signal LEFT C1 foramen transversarium corresponding to LEFT vertebral artery. Otherwise normal major intracranial vascular flow voids present at skull base. SKULL AND UPPER CERVICAL SPINE: No abnormal sellar expansion. No suspicious calvarial bone marrow signal. Posterior cervical spine susceptibility artifact likely reflects hardware. Craniocervical junction maintained. SINUSES/ORBITS: Mild paranasal sinus mucosal thickening. Air-fluid level in the pharynx due to life-support lines. Mastoid air cells are well aerated.The included ocular globes and orbital contents are non-suspicious. OTHER: Life-support lines in place. MRA HEAD FINDINGS ANTERIOR CIRCULATION: Normal flow related enhancement of the included cervical, petrous, cavernous and supraclinoid internal carotid arteries. Patent anterior communicating artery. Patent anterior and middle cerebral arteries, mild luminal irregularity seen with atherosclerosis or motion artifact. No large vessel occlusion, flow limiting stenosis though limited by motion, aneurysm. POSTERIOR CIRCULATION: RIGHT vertebral artery is dominant. For flow related enhancement LEFT vertebral artery with retrograde flow at vertebrobasilar junction. Patent RIGHT vertebral artery mild luminal irregularity seen with motion artifact or atherosclerosis. No flow limiting stenosis though limited by motion, aneurysm. ANATOMIC VARIANTS: None. Source images and MIP images were  reviewed. IMPRESSION: MRI HEAD: 1. Motion degraded examination. 2. 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 Regional mass effect without obstructive hydrocephalus. MRA HEAD: 1. Slow flow versus occluded LEFT vertebral artery. 2. No flow limiting stenosis on this motion degraded examination. Electronically Signed   By: Elon Alas M.D.   On: 09/04/2017 15:33   Dg Pelvis Portable  Result Date: 09/04/2017 CLINICAL DATA:  For MRI clearance. EXAM: PORTABLE PELVIS 1-2 VIEWS COMPARISON:  None. FINDINGS: Foley catheter in place. No metallic devices or foreign bodies are visible. Bones appear normal. Bowel gas pattern is normal. IMPRESSION: Negative exam for metal in the pelvis. Electronically Signed   By: Lorriane Shire M.D.   On: 09/04/2017 09:59   Dg Chest Port 1 View  Result Date: 09/11/2017 CLINICAL DATA:  Respiratory failure. EXAM: PORTABLE CHEST 1 VIEW COMPARISON:  09/07/2017 FINDINGS: Since the previous exam, the endotracheal tube and right internal jugular central venous line have been removed. The nasogastric tube has been replaced with an enteric tube, which passes well below the diaphragm. Changes from cardiac surgery are stable. Lungs show prominent bronchovascular markings, also stable. No evidence of pneumonia or pulmonary edema. No pleural effusion or pneumothorax. IMPRESSION: 1. No acute findings.  No evidence of pneumonia or pulmonary edema. 2. Status post extubation and removal of the right internal jugular central venous line since previous day's study. Electronically Signed   By: Lajean Manes M.D.   On: 09/11/2017 07:50   Dg Chest Port 1 View  Result Date: 09/07/2017 CLINICAL DATA:  Hypoxia EXAM: PORTABLE CHEST 1 VIEW COMPARISON:  Sep 06, 2017 FINDINGS: Endotracheal tube  tip is 4.1 cm above the carina. Central catheter tip is in the superior vena cava. Nasogastric tube tip and side port are in the stomach. No adenopathy. There  is no evident edema or consolidation. The heart size and pulmonary vascularity are normal. No adenopathy. There is aortic atherosclerosis. No evident bone lesions. IMPRESSION: Tube and catheter positions as described without pneumothorax. No edema or consolidation. There is aortic atherosclerosis. Aortic Atherosclerosis (ICD10-I70.0). Electronically Signed   By: Lowella Grip III M.D.   On: 09/07/2017 09:18   Dg Chest Port 1 View  Result Date: 09/06/2017 CLINICAL DATA:  Encounter for intubation. EXAM: PORTABLE CHEST 1 VIEW COMPARISON:  Yesterday at 05:55 hour FINDINGS: Endotracheal tube tip 5.3 cm from the carina. Tip and side port of the enteric tube below the diaphragm in the stomach. Right internal jugular central venous catheter tip in the mid SVC. Patient is post median sternotomy. Normal heart size. Atherosclerosis of the aortic arch. Mild hyperinflation. No consolidation, pleural effusion or pneumothorax. IMPRESSION: 1. Endotracheal tube, enteric tube, and right central line in appropriate position. 2. Otherwise unchanged appearance of the chest without acute abnormality. Electronically Signed   By: Jeb Levering M.D.   On: 09/06/2017 01:51   Dg Chest Port 1 View  Result Date: 09/05/2017 CLINICAL DATA:  Ventilator dependence. EXAM: PORTABLE CHEST 1 VIEW COMPARISON:  09/04/2017 FINDINGS: 0555 hours. Endotracheal tube tip is 5.8 cm above the base of the carina. The NG tube passes into the stomach although the distal tip position is not included on the film. Right IJ central line tip overlies the mid SVC. The lungs are clear without focal pneumonia, edema, pneumothorax or pleural effusion. Symmetric nodular densities overlying the lower lungs bilaterally are likely nipple shadows. Cardiopericardial silhouette is at upper limits of normal for size. The visualized bony structures of the thorax are intact. Telemetry leads overlie the chest. IMPRESSION: Stable.  No acute cardiopulmonary findings.  Electronically Signed   By: Misty Stanley M.D.   On: 09/05/2017 08:19   Dg Chest Port 1 View  Result Date: 09/04/2017 CLINICAL DATA:  Central line placement EXAM: PORTABLE CHEST 1 VIEW COMPARISON:  09/04/2017 FINDINGS: Endotracheal tube tip is about 4.1 cm superior to the carina. Esophageal tube tip extends below diaphragm but the tip is non included. Right central venous catheter tip overlies the SVC. No pneumothorax. Post sternotomy changes. Clear lung fields. Stable cardiomediastinal silhouette. IMPRESSION: 1. Right IJ central venous catheter tip overlies the SVC. Negative for pneumothorax. 2. Endotracheal tube tip about 4.1 cm superior to carina 3. Clear lung fields Electronically Signed   By: Donavan Foil M.D.   On: 09/04/2017 23:48   Dg Chest Port 1 View  Result Date: 09/04/2017 CLINICAL DATA:  Intubation. EXAM: PORTABLE CHEST 1 VIEW COMPARISON:  Chest radiograph Sep 12, 2017 at 0532 hours FINDINGS: Endotracheal tube tip projects 6.4 cm above the carina. Nasogastric tube past proximal stomach. Gas distended stomach. Cardiomediastinal silhouette is normal. Calcified aortic arch. Mild bronchitic changes without pleural effusion or focal consolidation. No pneumothorax. Osseous structures are unchanged. IMPRESSION: Endotracheal tube tip projects 6.4 cm above the carina, consider 1-2 cm advancement. Nasogastric tube past proximal stomach, gas distended stomach. Mild bronchitic changes. Aortic Atherosclerosis (ICD10-I70.0). Electronically Signed   By: Elon Alas M.D.   On: 09/04/2017 21:58   Dg Chest Port 1 View  Result Date: 09/04/2017 CLINICAL DATA:  Intubation. EXAM: PORTABLE CHEST 1 VIEW COMPARISON:  None. FINDINGS: Endotracheal tube tip just above the clavicular heads 7.4  cm from the carina. Advancement of 1-2 cm would lead to optimal placement. Post median sternotomy. Heart size is normal. No pulmonary edema. Patchy left infrahilar opacities partially obscured by overlying monitoring  devices. No large pleural effusion or pneumothorax. IMPRESSION: 1. Endotracheal tube tip just above the clavicular heads 7.4 cm from the carina, advancement of 1-2 cm would lead to optimal placement. 2. Patchy left infrahilar opacities likely atelectasis, partially obscured by overlying monitoring device. Electronically Signed   By: Jeb Levering M.D.   On: 09/04/2017 06:47   Dg Abd Portable 1v  Result Date: 09/06/2017 CLINICAL DATA:  OG tube placement EXAM: PORTABLE ABDOMEN - 1 VIEW COMPARISON:  09/06/2017 FINDINGS: OG tube is in the stomach with the tip in the fundus of the stomach. Nonobstructive bowel gas pattern. IMPRESSION: OG tube tip in the fundus of the stomach. Electronically Signed   By: Rolm Baptise M.D.   On: 09/06/2017 17:32   Dg Abd Portable 1v  Result Date: 09/06/2017 CLINICAL DATA:  Gastric tube placement EXAM: PORTABLE ABDOMEN - 1 VIEW COMPARISON:  09/04/2017 FINDINGS: Gastric tube has been advanced in the stomach with the tip now in the region of the gastric antrum. Stomach decompressed. No bowel obstruction. IMPRESSION: NG tip in the region of the gastric antrum. Electronically Signed   By: Franchot Gallo M.D.   On: 09/06/2017 12:04   Dg Abd Portable 1v  Result Date: 09/04/2017 CLINICAL DATA:  Screening for MRI. EXAM: PORTABLE ABDOMEN - 1 VIEW COMPARISON:  None. FINDINGS: Portable supine view the abdomen and pelvis shows gaseous distention of the stomach despite the presence of an NG tube with the distal tip positioned in the mid stomach. No gaseous bowel dilatation. Visualized lung bases are unremarkable. The patient does have a temperature probe overlying the inferior pelvis. Otherwise, no findings to suggest metallic foreign body in the abdomen or pelvis. IMPRESSION: 1. Apparent temperature probe overlies the inferior anatomic pelvis and appears to contain metallic wire. 2. Otherwise no unexpected metallic foreign body over the abdomen or pelvis. Electronically Signed   By: Misty Stanley M.D.   On: 09/04/2017 10:07   Mr Jodene Nam Head/brain NW Cm  Result Date: 09/04/2017 CLINICAL DATA:  Follow-up LEFT cerebellar infarct. Found unresponsive in car at [employer]. Critically ill. History of hypertension. EXAM: MRI HEAD WITHOUT CONTRAST MRA HEAD WITHOUT CONTRAST TECHNIQUE: Multiplanar, multiecho pulse sequences of the brain and surrounding structures were obtained without intravenous contrast. Angiographic images of the head were obtained using MRA technique without contrast. COMPARISON:  CT HEAD Sep 04, 2017 at 0527 hours FINDINGS: Multiple sequences are moderately motion degraded. MRI HEAD FINDINGS INTRACRANIAL CONTENTS: Confluent reduced diffusion LEFT pons, LEFT cerebellum predominately involving the superior portion. Patchy reduced diffusion medial mesial RIGHT cerebellum. All areas of reduced diffusion demonstrate low ADC values and bright FLAIR signal. No susceptibility artifact to suggest hemorrhage. Fourth ventricle is open though, there is mild upward herniation and narrowed LEFT quadrigeminal cistern. No parenchymal brain volume loss for age. No supratentorial midline shift. No masses. No abnormal extra-axial fluid collections. VASCULAR: T2 bright signal LEFT C1 foramen transversarium corresponding to LEFT vertebral artery. Otherwise normal major intracranial vascular flow voids present at skull base. SKULL AND UPPER CERVICAL SPINE: No abnormal sellar expansion. No suspicious calvarial bone marrow signal. Posterior cervical spine susceptibility artifact likely reflects hardware. Craniocervical junction maintained. SINUSES/ORBITS: Mild paranasal sinus mucosal thickening. Air-fluid level in the pharynx due to life-support lines. Mastoid air cells are well aerated.The included ocular globes and orbital  contents are non-suspicious. OTHER: Life-support lines in place. MRA HEAD FINDINGS ANTERIOR CIRCULATION: Normal flow related enhancement of the included cervical, petrous, cavernous and  supraclinoid internal carotid arteries. Patent anterior communicating artery. Patent anterior and middle cerebral arteries, mild luminal irregularity seen with atherosclerosis or motion artifact. No large vessel occlusion, flow limiting stenosis though limited by motion, aneurysm. POSTERIOR CIRCULATION: RIGHT vertebral artery is dominant. For flow related enhancement LEFT vertebral artery with retrograde flow at vertebrobasilar junction. Patent RIGHT vertebral artery mild luminal irregularity seen with motion artifact or atherosclerosis. No flow limiting stenosis though limited by motion, aneurysm. ANATOMIC VARIANTS: None. Source images and MIP images were reviewed. IMPRESSION: MRI HEAD: 1. Motion degraded examination. 2. 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 Regional mass effect without obstructive hydrocephalus. MRA HEAD: 1. Slow flow versus occluded LEFT vertebral artery. 2. No flow limiting stenosis on this motion degraded examination. Electronically Signed   By: Elon Alas M.D.   On: 09/04/2017 15:33      HISTORY OF PRESENT ILLNESS Dillon Garcia is a 62 y.o. male, was brought in intubated from [facility] hospital as a transfer for higher level of care for a cerebellar stroke. History is obtained from chart review per Dr. Omer Jack note from earlier this morning.  Reportedly the patient was brought to [facility] emergency department via EMS after being found in his car at [facility].  He was unresponsive and appeared to be shaking at that point.  EMS believed that he might have had some seizure activity.  In the emergency department he was found to have obvious facial droop on the right side of his face, CT revealed an acute left cerebellar infarct.  He was admitted for further evaluation.  Initial NIH on arrival by Dr. Doy Mince was 31.  There is no last known normal.  He was not given TPA because of no reliable last  known normal.  No family member available at this time to provide any history.  He remains intubated.  According to Dr. Doy Mince, who I spoke with over the phone, his mental status started to decline while the transfer was being arranged.  He was following simple commands but that had started to change.  He was started on hypertonic saline.  Upon my assessment of the patient in the neuro ICU, he did not have hypertonic saline running on the way and it was started upon arrival in the ICU.  LKW: unable to determine tpa given?: no, due to no last known well time. Premorbid modified Rankin scale (mRS): 0 (presuming he was driving and was found in his car)    [place] Mr. WERNER LABELLA is a 62 y.o. male with history of HTN and HLD admitted for unresponsive in car with questionable shaking episode, concerning for seizure. Also found to have right facial droop and was intubated for airway protection. No tPA given due to OSW.   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 cardioembolic  Resultant intubated, eye movement difficulty, right facial droop, right hemiplegia  MRI left SCA and left pontine large infarcts as well as punctate right PICA infarct  MRA  Left VA slow flow vs. Occlusion  EEG - normal  CTA neck left V4 and VA origin occlusion  CT repeat 5/24 evolving L>R cerebellar and L pontine infarcts. Mild mass effect 4th vent but no hydrocephalus. New sm  L SAH vs thrombosed vein. Given questionable hmg vs slow flow cortical vein, will hold plavix for now and repeat CT Monday.  CT repeat 5/27 - stable  2D Echo EF 65-70%  TEE and Loop - consider as an OP when respiratory more stable   LE venous doppler no DVT  LDL 17  HgbA1c 5.5  Heparin subq for VTE prophylaxis  aspirin 81 mg daily prior to admission, now on aspirin 325 mg daily. Given hmg continue to holding plavix for  now, continue aspirin   Ongoing aggressive stroke risk factor management  Therapy recommendations:  CIR (but lives alone and has no one to care for following) ->SNF, wheel chair and w/c cushion  Disposition:  Pending.  SNF bed available tomorrow after insurance approval obtained.  Anticipate discharge at that time.  Seizure-like activity (on admission)  Doubt the "shaking" saw by EMS was seizure  Could be due to limb posturing due to brain stem infarct  EEG normal   No need AED this time  Cerebellar edema, induced hypernatremia, resolved  MRI showed large left cerebellar infarct  Repeat CT stable no hydrocephalus  Repeat CT no hydrocephalus  Off 3% saline due to high Na    Respiratory distress  Intubated, now extubated.  Lots of secretions, high aspiration risk  Dysphagia  Secondary to stroke       Diet Order                 DIET - DYS 1 Room service appropriate? Yes; Fluid consistency: Honey Thick  Diet effective now             panda remains in. TF not running. SLP will let us know when ok to remove  Coughs intermittently, working on self feeds  Hypertension  Elevated 157/116  Keep SBP goal < 180  Long term BP goal normotensive  Hyperlipidemia  Home meds:  lipitor 40   LDL 17, goal < 70  Now lipitor down to 10 mg  Continue lipitor 10 on discharge  Other Stroke Risk Factors  Advanced age  Other Active Problems  Leukocytosis, resolved  AKI, resolved   High aspiration risk      DISCHARGE EXAM Blood pressure (!) 139/107, pulse 90, temperature 99.1 F (37.3 C), temperature source Oral, resp. rate (!) 22, height 5\' 8"  (1.727 m), weight 137 lb 12.6 oz (62.5 kg), SpO2 100 %. General - frail african [place] male  Cardiovascular - Regular rate and rhythm. Neuro - awake, alert, oriented to person, place and situation. Follows all simple commands. soft hypophonic voice. Significant dysarthria. Secretions decreasing  but still with drooling (seuctions self). PERRL, both eyes left gaze palsy, will cross midline with effort, b/l eye right, up and downward gaze with nystagmus. Double vision. Can count fingers all fields but decreased L peripheral vision. Significant right facial droop. Tongue midline in mouth. No aphasia. LUE 4+/5, LLE 4+/5 at least. No babinski. RUE 0/5 and RLE 3-/5, with positive babinski. Sensation intact bilaterally. Decreased coordination left. Gait not tested.    Discharge Diet    Diet Order           DIET - DYS 1 Room service appropriate? Yes; Fluid consistency: Honey Thick  Diet effective now         liquids  DISCHARGE PLAN  Disposition:  Skilled nursing facility  aspirin 325 mg daily for secondary stroke prevention.  Ongoing risk factor control by Primary Care Physician at time of discharge  Follow-up Patient, No Pcp  Per in 2 weeks.  Follow-up in [facility] in 2 months, office to schedule an appointment.   40 minutes were spent preparing discharge.  Mikey Bussing PA-C [facility] Pager [phone] 09/15/2017, 3:49 PM I have personally examined this patient, reviewed notes, independently viewed imaging studies, participated in medical decision making and plan of care.ROS completed by me personally and pertinent positives fully documented  I have made any additions or clarifications directly to the above note. Agree with note above.   Antony Contras, MD Medical Director [facility] Pager: [phone] 09/15/2017 4:45 PM

## 2017-09-15 NOTE — Clinical Social Work Placement (Signed)
   CLINICAL SOCIAL WORK PLACEMENT  NOTE  Date:  09/15/2017  Patient Details  Name: Dillon Garcia MRN: [id number] Date of Birth: 12-18-1955  Clinical Social Work is seeking post-discharge placement for this patient at the [place] level of care (*CSW will initial, date and re-position this form in  chart as items are completed):  Yes   Patient/family provided with [facility] [facility] Work Department's list of facilities offering this level of care within the geographic area requested by the patient (or if unable, by the patient's family).  Yes   Patient/family informed of their freedom to choose among providers that offer the needed level of care, that participate in Medicare, Medicaid or managed care program needed by the patient, have an available bed and are willing to accept the patient.  Yes   Patient/family informed of [facility]'s ownership interest in [facility] and [facility], as well as of the fact that they are under no obligation to receive care at these facilities.  PASRR submitted to EDS on       PASRR number received on       Existing PASRR number confirmed on 09/11/17     FL2 transmitted to all facilities in geographic area requested by pt/family on 09/11/17     FL2 transmitted to all facilities within larger geographic area on       Patient informed that his/her managed care company has contracts with or will negotiate with certain facilities, including the following:        Yes   Patient/family informed of bed offers received.  Patient chooses bed at [facility]     Physician recommends and patient chooses bed at      Patient to be transferred to [facility] on 09/15/17.  Patient to be transferred to facility by [facility]     Patient family notified on 09/15/17 of transfer.  Name of family member notified:  [name], brother     PHYSICIAN       Additional Comment:     _______________________________________________ Eileen Stanford, LCSW 09/15/2017, 2:59 PM

## 2017-11-14 NOTE — Progress Notes (Deleted)
[facility] [address] [place]. [place] [address] [phone]       OFFICE FOLLOW UP NOTE  [name] Date of Birth:  06/08/1955 Medical Record Number:  [id number]   Reason for Referral:  hospital stroke follow up  CHIEF COMPLAINT:  No chief complaint on file.   HPI: Dillon Garcia is being seen today for initial visit in the office for bilateral cerebellar and left pontine infarcts secondary to likely left vertebral occlusion with distal embolization on 09/04/2017. History obtained from patient and chart review. Reviewed all radiology images and labs personally.  Dillon Garcia is a 62 year old male with PMH of HTN and HLD who was admitted to [facility] regional hospital via EMS after being found in his car at [facility] unresponsive and shaking per ED notes.  It was believed that patient possibly could have seizure activity but it was observed in the ED per patient having obvious facial droop on the right side of his face.  CT head showed acute left cerebellar infarct.  Unable to give TPA due to not knowing last known normal.  Patient was transferred to [facility] for further stroke work-up.  MRI head reviewed and showed left SCA and left pontine large infarcts as well as punctate right PICA infarcts.  MRA of head showed left VA slow flow versus occlusion.  EEG was normal without seizure activity.  CTA head and neck showed left before and VA origin occlusion.  Repeat CT on 09/06/2017 showed evolving L>R cerebellar and left pontine infarcts along with a new small left subarachnoid hemorrhage versus thrombosed vein.  Repeat CT on 5/27 was stable.  2D echo showed an EF of 65 to 70%.  Recommended TEE and loop recorder placement as outpatient due to embolic etiology with unclear source possibly cardioembolic versus large VA dissection/occlusion.  LDL 17 and A1c 5.5.  As patient was on aspirin 81 mg CTA he was started on aspirin 325 but was held due to hemorrhage and recommend he  continue aspirin at discharge.  Therapies recommended CIR but due to patient living alone patient was discharged to SNF.    ROS:   14 system review of systems performed and negative with exception of ***  PMH:  Past Medical History:  Diagnosis Date  . GERD (gastroesophageal reflux disease)   . Heart disease   . HTN (hypertension)   . Hyperlipidemia   . Hypothyroid   . Infectious endocarditis 2012  . Mitral valve disease 2012    PSH:  Past Surgical History:  Procedure Laterality Date  . MEDIAN STERNOTOMY     CABG? valve replacement?  Marland Kitchen MITRAL VALVE REPLACEMENT  2012    Social History:  Social History   Socioeconomic History  . Marital status: Single    Spouse name: Not on file  . Number of children: Not on file  . Years of education: Not on file  . Highest education level: Not on file  Occupational History  . Not on file  Social Needs  . Financial resource strain: Not on file  . Food insecurity:    Worry: Not on file    Inability: Not on file  . Transportation needs:    Medical: Not on file    Non-medical: Not on file  Tobacco Use  . Smoking status: Current Every Day Smoker  . Smokeless tobacco: Current User  Substance and Sexual Activity  . Alcohol use: Not on file  . Drug use: Not on file  .  Sexual activity: Not on file  Lifestyle  . Physical activity:    Days per week: Not on file    Minutes per session: Not on file  . Stress: Not on file  Relationships  . Social connections:    Talks on phone: Not on file    Gets together: Not on file    Attends religious service: Not on file    Active member of club or organization: Not on file    Attends meetings of clubs or organizations: Not on file    Relationship status: Not on file  . Intimate partner violence:    Fear of current or ex partner: Not on file    Emotionally abused: Not on file    Physically abused: Not on file    Forced sexual activity: Not on file  Other Topics Concern  . Not on file    Social History Narrative  . Not on file    Family History: No family history on file.  Medications:   Current Outpatient Medications on File Prior to Visit  Medication Sig Dispense Refill  . acetaminophen (TYLENOL) 325 MG tablet Take 2 tablets (650 mg total) by mouth every 4 (four) hours as needed for mild pain (or temp > 37.5 C (99.5 F)).    Marland Kitchen amLODipine (NORVASC) 5 MG tablet Take 10 mg by mouth daily.     Marland Kitchen aspirin 325 MG tablet Take 1 tablet (325 mg total) by mouth daily. 30 tablet 11  . atorvastatin (LIPITOR) 10 MG tablet Take 1 tablet (10 mg total) by mouth daily at 6 PM. 30 tablet 6  . famotidine (PEPCID) 20 MG tablet Take 1 tablet (20 mg total) by mouth 2 (two) times daily. 60 tablet 6  . Maltodextrin-Xanthan Gum (RESOURCE THICKENUP CLEAR) POWD Thicken liquids to honey thick texture 1 Can 6  . multivitamin (PROSIGHT) TABS tablet Take 1 tablet by mouth daily. 30 each 0  . senna-docusate (SENOKOT-S) 8.6-50 MG tablet Take 1 tablet by mouth at bedtime as needed for mild constipation. 30 tablet 2   No current facility-administered medications on file prior to visit.     Allergies:  No Known Allergies   Physical Exam  There were no vitals filed for this visit. There is no height or weight on file to calculate BMI. No exam data present  General: well developed, well nourished, seated, in no evident distress Head: head normocephalic and atraumatic.   Neck: supple with no carotid or supraclavicular bruits Cardiovascular: regular rate and rhythm, no murmurs Musculoskeletal: no deformity Skin:  no rash/petichiae Vascular:  Normal pulses all extremities  Neurologic Exam Mental Status: Awake and fully alert. Oriented to place and time. Recent and remote memory intact. Attention span, concentration and fund of knowledge appropriate. Mood and affect appropriate.  No flowsheet data found. Cranial Nerves: Fundoscopic exam reveals sharp disc margins. Pupils equal, briskly reactive to  light. Extraocular movements full without nystagmus. Visual fields full to confrontation. Hearing intact. Facial sensation intact. Face, tongue, palate moves normally and symmetrically.  Motor: Normal bulk and tone. Normal strength in all tested extremity muscles. Sensory.: intact to touch , pinprick , position and vibratory sensation.  Coordination: Rapid alternating movements normal in all extremities. Finger-to-nose and heel-to-shin performed accurately bilaterally. Gait and Station: Arises from chair without difficulty. Stance is normal. Gait demonstrates normal stride length and balance . Able to heel, toe and tandem walk without difficulty.  Reflexes: 1+ and symmetric. Toes downgoing.    NIHSS  ***  Modified Rankin  ***   Diagnostic Data (Labs, Imaging, Testing)  CT head without contrast 09/04/2017 IMPRESSION: 1. Evolving acute ischemic infarct involving the superior left cerebellar hemisphere, left superior cerebellar artery territory. No associated hemorrhage or significant mass effect at this time. 2. Age-related cerebral atrophy with mild chronic small vessel ischemic disease.  MRI brain without contrast MR MRA head without contrast 09/04/2017 IMPRESSION: MRI HEAD: 1. Motion degraded examination. 2. 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 Regional mass effect without obstructive hydrocephalus. MRA HEAD: 1. Slow flow versus occluded LEFT vertebral artery. 2. No flow limiting stenosis on this motion degraded examination.  CT head without contrast (repeat) 09/05/2017 IMPRESSION: 1. Continued interval evolution of acute ischemic left greater than right pontocerebellar infarcts, with slightly increased localized edema as compared to previous. Adjacent fourth ventricle remains patent without hydrocephalus. No evidence for hemorrhagic transformation or other complication. 2. No other new acute intracranial  abnormality.  CT angios neck with and without contrast 09/05/2017 IMPRESSION: 1. 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 2. Separate segment of left vertebral artery origin occlusion/near occlusion. 3. Patent distal left V2, V3, and V4 with hypoenhancement, probably representing retrograde flow. 4. Patent right vertebral and bilateral carotid systems without significant stenosis by NASCET criteria.  CT head without contrast (repeat) 09/06/2017 IMPRESSION: 1. Evolving acute LEFT greater than RIGHT cerebellar and LEFT pontine nonhemorrhagic infarcts. Similar cytotoxic edema with mild mass effect on fourth ventricle. No hydrocephalus.  CT head without contrast (repeat) 09/08/2017 IMPRESSION: 1. Evolving acute LEFT greater than RIGHT cerebellar and LEFT pontine nonhemorrhagic infarcts. Mild mass effect on fourth ventricle without hydrocephalus. 2. New small volume LEFT subarachnoid hemorrhage versus thrombosed cortical vein  CT head without contrast (repeat) 09/11/2017 IMPRESSION: 1. Unchanged appearance of cerebellar and pontine infarcts with persistent posterior fossa mass effect, but no obstructive hydrocephalus. 2. Unchanged small focus of subarachnoid hemorrhage over the parafalcine left convexity.     ASSESSMENT: Dillon Garcia is a 62 y.o. year old male here with bilateral cerebellar and left pontine infarcts on 08/25/2017 secondary to likely left vertebral occlusion and distal embolization. Vascular risk factors include HTN and HLD.     PLAN: -Continue {anticoagulants:31417}  and ***  for secondary stroke prevention -F/u with PCP regarding your *** management -continue to monitor BP at home  -Maintain strict control of hypertension with blood pressure goal below 130/90, diabetes with hemoglobin A1c goal below 6.5% and cholesterol with LDL cholesterol  (bad cholesterol) goal below 70 mg/dL. I also advised the patient to eat a healthy diet with plenty of whole grains, cereals, fruits and vegetables, exercise regularly and maintain ideal body weight.  Follow up in *** or call earlier if needed   Greater than 50% of time during this 25 minute visit was spent on counseling,explanation of diagnosis of ***, reviewing risk factor management of ***, planning of further management, discussion with patient and family and coordination of care    Venancio Poisson, [facility]  [facility] [address] [place], [place] [address]  Phone [phone] Fax [phone]

## 2017-11-15 ENCOUNTER — Ambulatory Visit: Payer: Self-pay | Admitting: Adult Health

## 2017-11-16 ENCOUNTER — Encounter: Payer: Self-pay | Admitting: Adult Health

## 2017-11-16 ENCOUNTER — Telehealth: Payer: Self-pay

## 2017-11-16 NOTE — Telephone Encounter (Signed)
Patient no show for appt on 11/15/2017.

## 2018-09-11 ENCOUNTER — Emergency Department: Payer: Medicare Other

## 2018-09-11 ENCOUNTER — Other Ambulatory Visit: Payer: Self-pay

## 2018-09-11 ENCOUNTER — Observation Stay (HOSPITAL_COMMUNITY)
Admission: AD | Admit: 2018-09-11 | Discharge: 2018-09-12 | Disposition: A | Discharge status: Skilled Nursing Facility | Payer: Medicare Other | Source: Other Acute Inpatient Hospital | Attending: Internal Medicine | Admitting: Internal Medicine

## 2018-09-11 ENCOUNTER — Emergency Department
Admission: EM | Admit: 2018-09-11 | Discharge: 2018-09-11 | Disposition: A | Discharge status: Other Acute Inpatient Hospital | Payer: Medicare Other | Attending: Emergency Medicine | Admitting: Emergency Medicine

## 2018-09-11 DIAGNOSIS — G40909 Epilepsy, unspecified, not intractable, without status epilepticus: Secondary | ICD-10-CM | POA: Insufficient documentation

## 2018-09-11 DIAGNOSIS — K219 Gastro-esophageal reflux disease without esophagitis: Secondary | ICD-10-CM | POA: Insufficient documentation

## 2018-09-11 DIAGNOSIS — G8929 Other chronic pain: Secondary | ICD-10-CM | POA: Insufficient documentation

## 2018-09-11 DIAGNOSIS — J069 Acute upper respiratory infection, unspecified: Secondary | ICD-10-CM | POA: Diagnosis present

## 2018-09-11 DIAGNOSIS — Z8673 Personal history of transient ischemic attack (TIA), and cerebral infarction without residual deficits: Secondary | ICD-10-CM | POA: Insufficient documentation

## 2018-09-11 DIAGNOSIS — E039 Hypothyroidism, unspecified: Secondary | ICD-10-CM | POA: Diagnosis not present

## 2018-09-11 DIAGNOSIS — E785 Hyperlipidemia, unspecified: Secondary | ICD-10-CM | POA: Diagnosis not present

## 2018-09-11 DIAGNOSIS — I69322 Dysarthria following cerebral infarction: Secondary | ICD-10-CM | POA: Insufficient documentation

## 2018-09-11 DIAGNOSIS — R079 Chest pain, unspecified: Secondary | ICD-10-CM | POA: Insufficient documentation

## 2018-09-11 DIAGNOSIS — J9601 Acute respiratory failure with hypoxia: Secondary | ICD-10-CM | POA: Insufficient documentation

## 2018-09-11 DIAGNOSIS — Z79899 Other long term (current) drug therapy: Secondary | ICD-10-CM | POA: Insufficient documentation

## 2018-09-11 DIAGNOSIS — Z7982 Long term (current) use of aspirin: Secondary | ICD-10-CM | POA: Insufficient documentation

## 2018-09-11 DIAGNOSIS — Z87891 Personal history of nicotine dependence: Secondary | ICD-10-CM | POA: Insufficient documentation

## 2018-09-11 DIAGNOSIS — I10 Essential (primary) hypertension: Secondary | ICD-10-CM | POA: Insufficient documentation

## 2018-09-11 DIAGNOSIS — I69391 Dysphagia following cerebral infarction: Secondary | ICD-10-CM

## 2018-09-11 DIAGNOSIS — Z952 Presence of prosthetic heart valve: Secondary | ICD-10-CM | POA: Diagnosis not present

## 2018-09-11 DIAGNOSIS — E278 Other specified disorders of adrenal gland: Secondary | ICD-10-CM | POA: Diagnosis not present

## 2018-09-11 DIAGNOSIS — U071 COVID-19: Secondary | ICD-10-CM | POA: Diagnosis not present

## 2018-09-11 DIAGNOSIS — I700 Atherosclerosis of aorta: Secondary | ICD-10-CM | POA: Insufficient documentation

## 2018-09-11 DIAGNOSIS — R569 Unspecified convulsions: Secondary | ICD-10-CM

## 2018-09-11 DIAGNOSIS — J439 Emphysema, unspecified: Secondary | ICD-10-CM | POA: Diagnosis not present

## 2018-09-11 LAB — COMPREHENSIVE METABOLIC PANEL
ALT: 40 U/L (ref 0–44)
AST: 39 U/L (ref 15–41)
Albumin: 3.4 g/dL — ABNORMAL LOW (ref 3.5–5.0)
Alkaline Phosphatase: 79 U/L (ref 38–126)
Anion gap: 6 (ref 5–15)
BUN: 11 mg/dL (ref 8–23)
CO2: 27 mmol/L (ref 22–32)
Calcium: 8.8 mg/dL — ABNORMAL LOW (ref 8.9–10.3)
Chloride: 106 mmol/L (ref 98–111)
Creatinine, Ser: 0.84 mg/dL (ref 0.61–1.24)
GFR calc Af Amer: >60 mL/min (ref >60)
GFR calc non Af Amer: >60 mL/min (ref >60)
Glucose, Bld: 91 mg/dL (ref 70–99)
Potassium: 4 mmol/L (ref 3.5–5.1)
Sodium: 139 mmol/L (ref 135–145)
Total Bilirubin: 0.5 mg/dL (ref 0.3–1.2)
Total Protein: 6.4 g/dL — ABNORMAL LOW (ref 6.5–8.1)

## 2018-09-11 LAB — FIBRIN DERIVATIVES D-DIMER (ARMC ONLY): Fibrin derivatives D-dimer (ARMC): 2092.01 ng/mL (FEU) — ABNORMAL HIGH (ref 0.00–499.00)

## 2018-09-11 LAB — CBC
HCT: 42.5 % (ref 39.0–52.0)
Hemoglobin: 13.7 g/dL (ref 13.0–17.0)
MCH: 27.1 pg (ref 26.0–34.0)
MCHC: 32.2 g/dL (ref 30.0–36.0)
MCV: 84.2 fL (ref 80.0–100.0)
Platelets: 222 10*3/uL (ref 150–400)
RBC: 5.05 MIL/uL (ref 4.22–5.81)
RDW: 12.9 % (ref 11.5–15.5)
WBC: 6.5 10*3/uL (ref 4.0–10.5)
nRBC: 0 % (ref 0.0–0.2)

## 2018-09-11 LAB — FERRITIN: Ferritin: 351 ng/mL — ABNORMAL HIGH (ref 24–336)

## 2018-09-11 LAB — SEDIMENTATION RATE: Sed Rate: 23 mm/hr — ABNORMAL HIGH (ref 0–20)

## 2018-09-11 LAB — LIPASE, BLOOD: Lipase: 40 U/L (ref 11–51)

## 2018-09-11 LAB — SARS CORONAVIRUS 2 BY RT PCR (HOSPITAL ORDER, PERFORMED IN ~~LOC~~ HOSPITAL LAB): SARS Coronavirus 2: POSITIVE — AB

## 2018-09-11 LAB — TROPONIN I
Troponin I: <0.03 ng/mL (ref <0.03)
Troponin I: <0.03 ng/mL (ref <0.03)

## 2018-09-11 MED ORDER — IOHEXOL 350 MG/ML SOLN: 75.0000 mL | Freq: Once | INTRAVENOUS | Status: DC | PRN

## 2018-09-11 NOTE — ED Notes (Signed)
Date and time results received: 09/11/18 2:59 PM (use smartphrase ".now" to insert current time)  Test: COVID Critical Value: Positive  Name of Provider Notified: Vallarie Mare  Orders Received? Or Actions Taken?: No new orders at this time.

## 2018-09-11 NOTE — Progress Notes (Signed)
Patient admitted to room 138. Dr. Bonner Puna at the bedside assessing patient.

## 2018-09-11 NOTE — ED Notes (Addendum)
This RN to bedside at this time. Pt requesting to be covered up with a blanket. Pt covered with a blanket. Pt otherwise visualized in NAD at this time.

## 2018-09-11 NOTE — ED Notes (Signed)
Provider at bedside

## 2018-09-11 NOTE — ED Notes (Signed)
Call from [facility] on their way to pick up pt

## 2018-09-11 NOTE — ED Provider Notes (Signed)
Patient seen and evaluated and staffed with physician assistant.  Patient presented for chest pain.  Is very poor historian.  Work-up here demonstrates the patient to be COVID positive which I suspect is causing his chest pain and discomfort.  Patient will be requiring transfer, discussed and patient will be transferred to [facility] accepted to [place] [facility].   Delman Kitten, MD 09/11/18 [phone]

## 2018-09-11 NOTE — ED Notes (Signed)
Pt denies chest pain at this time.

## 2018-09-11 NOTE — ED Notes (Signed)
E-signature not working at this time. Pt gives verbal consent to this RN to be transferred to [facility]. Pt verbalizes understanding of risks and benefits of transfer. Danise Mina, RN as witness to verbal consent.

## 2018-09-11 NOTE — ED Provider Notes (Signed)
[facility] Emergency Department Provider Note  ____________________________________________  Time seen: Approximately 2:36 PM  I have reviewed the triage vital signs and the nursing notes.   HISTORY  Chief Complaint Chest Pain    HPI Dillon Garcia is a 63 y.o. male with a history of CVA, hypertension, smoking hyperlipidemia, GERD and hypothyroidism, presents to the emergency department with chest pain.  Patient resides at [facility].  I called [facility] and spoke with patient's nurse, Ivin Booty, who states that patient initially started complaining of episodic chest pain on Sunday.  He was given sublingual nitro and conveyed that his chest pain improved.  Ivin Booty states that he did not have any chest pain yesterday but began complaining of chest pain again today around 11:40 in the morning. Patient was given sublingual nitro prior to presenting to the emergency department today.  Nurse Ivin Booty denies recent emesis or diarrhea.  No nasal congestion, rhinorrhea or cough at [facility].  Patient has been placed on 2 L of supplemental oxygen over the past 2 days.  He has been afebrile.  No known complaints of abdominal discomfort.  No emesis or diarrhea at home.  Historical information is limited due to deficits in speech. No other alleviating measures have been attempted.         Past Medical History:  Diagnosis Date  . GERD (gastroesophageal reflux disease)   . Heart disease   . HTN (hypertension)   . Hyperlipidemia   . Hypothyroid   . Infectious endocarditis 2012  . Mitral valve disease 2012    Patient Active Problem List   Diagnosis Date Noted  . Aspiration into airway   . Benign essential HTN   . Tobacco abuse   . Seizures ([place])   . Tachypnea   . Bradycardia   . Diastolic dysfunction   . Hypernatremia   . Malnutrition of moderate degree 09/07/2017  . Encounter for orogastric (OG) tube placement   . Cerebral edema ([place]) 09/05/2017  . Stroke  (cerebrum) ([place]) 09/04/2017  . Acute ischemic stroke ([place]) 09/04/2017    Past Surgical History:  Procedure Laterality Date  . MEDIAN STERNOTOMY     CABG? valve replacement?  Marland Kitchen MITRAL VALVE REPLACEMENT  2012    Prior to Admission medications   Medication Sig Start Date End Date Taking? Authorizing Provider  acetaminophen (TYLENOL) 325 MG tablet Take 2 tablets (650 mg total) by mouth every 4 (four) hours as needed for mild pain (or temp > 37.5 C (99.5 F)). 09/15/17   Rinehuls, David L, PA-C  amLODipine (NORVASC) 5 MG tablet Take 10 mg by mouth daily.     [provider]  aspirin 325 MG tablet Take 1 tablet (325 mg total) by mouth daily. 09/16/17   Rinehuls, Early Chars, PA-C  atorvastatin (LIPITOR) 10 MG tablet Take 1 tablet (10 mg total) by mouth daily at 6 PM. 09/15/17   Rinehuls, Early Chars, PA-C  famotidine (PEPCID) 20 MG tablet Take 1 tablet (20 mg total) by mouth 2 (two) times daily. 09/15/17   Rinehuls, Early Chars, PA-C  Maltodextrin-Xanthan Gum (RESOURCE THICKENUP CLEAR) POWD Thicken liquids to honey thick texture 09/15/17   Rinehuls, Early Chars, PA-C  multivitamin (PROSIGHT) TABS tablet Take 1 tablet by mouth daily. 09/16/17   Rinehuls, Early Chars, PA-C  senna-docusate (SENOKOT-S) 8.6-50 MG tablet Take 1 tablet by mouth at bedtime as needed for mild constipation. 09/15/17   Rinehuls, Early Chars, PA-C    Allergies Patient has no  known allergies.  History reviewed. No pertinent family history.  Social History Social History   Tobacco Use  . Smoking status: Former Research scientist (life sciences)  . Smokeless tobacco: Current User  Substance Use Topics  . Alcohol use: Not Currently  . Drug use: Not Currently     Review of Systems  Constitutional: No fever/chills Eyes: No visual changes. No discharge ENT: No upper respiratory complaints. Cardiovascular: Patient has chest pain.  Respiratory: no cough. No SOB. Gastrointestinal: No abdominal pain.  No nausea, no vomiting.  No diarrhea.  No  constipation. Genitourinary: Negative for dysuria. No hematuria Musculoskeletal: Negative for musculoskeletal pain. Skin: Negative for rash, abrasions, lacerations, ecchymosis. Neurological: Negative for headaches, focal weakness or numbness.   ___________________________________  PHYSICAL EXAM:  VITAL SIGNS: ED Triage Vitals  Enc Vitals Group     BP 09/11/18 1258 95/73     Pulse Rate 09/11/18 1249 66     Resp 09/11/18 1249 (!) 23     Temp 09/11/18 1249 98 F (36.7 C)     Temp Source 09/11/18 1249 Oral     SpO2 09/11/18 1258 100 %     Weight 09/11/18 1251 140 lb (63.5 kg)     Height 09/11/18 1251 5\' 5"  (1.651 m)     Head Circumference --      Peak Flow --      Pain Score --      Pain Loc --      Pain Edu? --      Excl. in [place]? --      Constitutional: Alert and oriented. Well appearing and in no acute distress. Eyes: Conjunctivae are normal. PERRL. EOMI. Head: Atraumatic. ENT:      Ears: TMs are pearly.       Nose: No congestion/rhinnorhea.      Mouth/Throat: Mucous membranes are moist.  Neck: No stridor.  No cervical spine tenderness to palpation. Cardiovascular: Normal rate, regular rhythm. Normal S1 and S2.  Good peripheral circulation. Respiratory: Normal respiratory effort without tachypnea or retractions. Lungs CTAB. Good air entry to the bases with no decreased or absent breath sounds. Gastrointestinal: Bowel sounds 4 quadrants. Soft and nontender to palpation. No guarding or rigidity. No palpable masses. No distention. No CVA tenderness. Musculoskeletal: Full range of motion to all extremities. No gross deformities appreciated. Neurologic:  Normal speech and language. No gross focal neurologic deficits are appreciated.  Skin:  Skin is warm, dry and intact. No rash noted. Psychiatric: Mood and affect are normal. Speech and behavior are normal. Patient exhibits appropriate insight and judgement.   ____________________________________________   LABS (all labs  ordered are listed, but only abnormal results are displayed)  Labs Reviewed  COMPREHENSIVE METABOLIC PANEL - Abnormal; Notable for the following components:      Result Value   Calcium 8.8 (*)    Total Protein 6.4 (*)    Albumin 3.4 (*)    All other components within normal limits  SARS CORONAVIRUS 2 (HOSPITAL ORDER, [place] [facility])  CBC  TROPONIN I   ____________________________________________  EKG   ____________________________________________  RADIOLOGY I personally viewed and evaluated these images as part of my medical decision making, as well as reviewing the written report by the radiologist.  Dg Chest Port 1 View  Result Date: 09/11/2018 CLINICAL DATA:  63 year old male with a history chest pain EXAM: PORTABLE CHEST 1 VIEW COMPARISON:  09/11/2017, 09/07/2017 FINDINGS: Cardiomediastinal silhouette unchanged in size and contour. No pneumothorax. No pleural effusion. Coarsened interstitial markings  of the lungs. No confluent airspace disease. Surgical changes of median sternotomy. No displaced fracture IMPRESSION: Chronic lung changes without evidence of acute cardiopulmonary disease Electronically Signed   By: Corrie Mckusick D.O.   On: 09/11/2018 13:21    ____________________________________________    PROCEDURES  Procedure(s) performed:    Procedures    Medications - No data to display   ____________________________________________   INITIAL IMPRESSION / ASSESSMENT AND PLAN / ED COURSE  Pertinent labs & imaging results that were available during my care of the patient were reviewed by me and considered in my medical decision making (see chart for details).  Review of the [place] CSRS was performed in accordance of the [place] prior to dispensing any controlled drugs.           Assessment and Plan:  Covid 20:  63 year old male presents to the emergency department with reports of episodic chest pain that has occurred since Sunday and was  relieved with sublingual nitro.  On physical exam, patient was tachypneic and has rambling speech. He is unable to localize pain.  He seems to be resting comfortably on 2 L of supplemental oxygen.  Patient initially tachypneic.  O2 sats 94%.  Differential diagnosis includes STEMI, PE, NSTEMI, high risk chest pain and COVID-19  EKG revealed normal sinus rhythm without ST segment elevation.  Troponin was within reference range.  Patient tested positive for COVID-19 in the emergency department.  Prime doc at [facility] was consulted, Dr. Tammi Klippel.  Dr. Tammi Klippel requested ferritin, d-dimer and sed rate and accepted patient for transfer. All patient questions were answered.    Dillon Garcia was evaluated in Emergency Department on 09/11/2018 for the symptoms described in the history of present illness. He was evaluated in the context of the global COVID-19 pandemic, which necessitated consideration that the patient might be at risk for infection with the SARS-CoV-2 virus that causes COVID-19. Institutional protocols and algorithms that pertain to the evaluation of patients at risk for COVID-19 are in a state of rapid change based on information released by regulatory bodies including the CDC and federal and state organizations. These policies and algorithms were followed during the patient's care in the ED.       ____________________________________________  FINAL CLINICAL IMPRESSION(S) / ED DIAGNOSES  Final diagnoses:  None      NEW MEDICATIONS STARTED DURING THIS VISIT:  ED Discharge Orders    None          This chart was dictated using voice recognition software/Dragon. Despite best efforts to proofread, errors can occur which can change the meaning. Any change was purely unintentional.    Lannie Fields, PA-C 09/11/18 1646    Delman Kitten, MD 09/12/18 [phone]

## 2018-09-11 NOTE — ED Provider Notes (Signed)
-----------------------------------------   3:55 PM on 09/11/2018 -----------------------------------------  Patient has been accepted to [facility].  Patient will be transferred as soon as a room is available.   Harvest Dark, MD 09/11/18 1556

## 2018-09-11 NOTE — Progress Notes (Signed)
Called patient's brother, [name]. Updated him about patient's condition and room assignment. Questions were encouraged and answered.

## 2018-09-11 NOTE — ED Notes (Signed)
[facility]  CALLED  FOR  TRANSFER

## 2018-09-11 NOTE — ED Triage Notes (Signed)
Pt from [facility]. C/o chest pain, given nitro sl by [facility]. Pt says pain resolved at that time. Sent via EMS due to decreased bp per [facility].

## 2018-09-12 ENCOUNTER — Encounter (HOSPITAL_COMMUNITY): Payer: Self-pay

## 2018-09-12 ENCOUNTER — Inpatient Hospital Stay (HOSPITAL_COMMUNITY): Payer: Medicare Other

## 2018-09-12 DIAGNOSIS — R569 Unspecified convulsions: Secondary | ICD-10-CM

## 2018-09-12 DIAGNOSIS — I69391 Dysphagia following cerebral infarction: Secondary | ICD-10-CM

## 2018-09-12 DIAGNOSIS — U071 COVID-19: Secondary | ICD-10-CM | POA: Diagnosis not present

## 2018-09-12 DIAGNOSIS — J9601 Acute respiratory failure with hypoxia: Secondary | ICD-10-CM

## 2018-09-12 DIAGNOSIS — I10 Essential (primary) hypertension: Secondary | ICD-10-CM | POA: Diagnosis not present

## 2018-09-12 DIAGNOSIS — E785 Hyperlipidemia, unspecified: Secondary | ICD-10-CM | POA: Diagnosis present

## 2018-09-12 DIAGNOSIS — K219 Gastro-esophageal reflux disease without esophagitis: Secondary | ICD-10-CM | POA: Diagnosis present

## 2018-09-12 DIAGNOSIS — J069 Acute upper respiratory infection, unspecified: Secondary | ICD-10-CM

## 2018-09-12 LAB — COMPREHENSIVE METABOLIC PANEL
ALT: 49 U/L — ABNORMAL HIGH (ref 0–44)
AST: 43 U/L — ABNORMAL HIGH (ref 15–41)
Albumin: 3.3 g/dL — ABNORMAL LOW (ref 3.5–5.0)
Alkaline Phosphatase: 79 U/L (ref 38–126)
Anion gap: 7 (ref 5–15)
BUN: 15 mg/dL (ref 8–23)
CO2: 28 mmol/L (ref 22–32)
Calcium: 9.2 mg/dL (ref 8.9–10.3)
Chloride: 106 mmol/L (ref 98–111)
Creatinine, Ser: 0.83 mg/dL (ref 0.61–1.24)
GFR calc Af Amer: >60 mL/min (ref >60)
GFR calc non Af Amer: >60 mL/min (ref >60)
Glucose, Bld: 81 mg/dL (ref 70–99)
Potassium: 3.9 mmol/L (ref 3.5–5.1)
Sodium: 141 mmol/L (ref 135–145)
Total Bilirubin: 0.8 mg/dL (ref 0.3–1.2)
Total Protein: 6.5 g/dL (ref 6.5–8.1)

## 2018-09-12 LAB — HEMOGLOBIN A1C
Hgb A1c MFr Bld: 6 % — ABNORMAL HIGH (ref 4.8–5.6)
Mean Plasma Glucose: 125.5 mg/dL

## 2018-09-12 LAB — CBC WITH DIFFERENTIAL/PLATELET
Abs Immature Granulocytes: 0.04 10*3/uL (ref 0.00–0.07)
Basophils Absolute: 0 10*3/uL (ref 0.0–0.1)
Basophils Relative: 1 %
Eosinophils Absolute: 0.3 10*3/uL (ref 0.0–0.5)
Eosinophils Relative: 4 %
HCT: 44.1 % (ref 39.0–52.0)
Hemoglobin: 14 g/dL (ref 13.0–17.0)
Immature Granulocytes: 1 %
Lymphocytes Relative: 25 %
Lymphs Abs: 1.9 10*3/uL (ref 0.7–4.0)
MCH: 27.6 pg (ref 26.0–34.0)
MCHC: 31.7 g/dL (ref 30.0–36.0)
MCV: 86.8 fL (ref 80.0–100.0)
Monocytes Absolute: 0.8 10*3/uL (ref 0.1–1.0)
Monocytes Relative: 11 %
Neutro Abs: 4.5 10*3/uL (ref 1.7–7.7)
Neutrophils Relative %: 58 %
Platelets: 215 10*3/uL (ref 150–400)
RBC: 5.08 MIL/uL (ref 4.22–5.81)
RDW: 12.9 % (ref 11.5–15.5)
WBC: 7.7 10*3/uL (ref 4.0–10.5)
nRBC: 0 % (ref 0.0–0.2)

## 2018-09-12 LAB — C-REACTIVE PROTEIN: CRP: <0.8 mg/dL (ref <1.0)

## 2018-09-12 LAB — D-DIMER, QUANTITATIVE: D-Dimer, Quant: 2.54 ug/mL-FEU — ABNORMAL HIGH (ref 0.00–0.50)

## 2018-09-12 LAB — ABO/RH: ABO/RH(D): B POS

## 2018-09-12 LAB — MAGNESIUM: Magnesium: 2 mg/dL (ref 1.7–2.4)

## 2018-09-12 LAB — PROCALCITONIN: Procalcitonin: <0.1 ng/mL

## 2018-09-12 LAB — FERRITIN: Ferritin: 350 ng/mL — ABNORMAL HIGH (ref 24–336)

## 2018-09-12 LAB — HIV ANTIBODY (ROUTINE TESTING W REFLEX): HIV Screen 4th Generation wRfx: NONREACTIVE

## 2018-09-12 MED ORDER — GABAPENTIN 100 MG PO CAPS
200.0000 mg | ORAL_CAPSULE | Freq: Two times a day (BID) | ORAL | Status: DC
  Administered 2018-09-12: 200 mg via ORAL
  Filled 2018-09-12 (×3): qty 2

## 2018-09-12 MED ORDER — ONDANSETRON HCL 4 MG/2ML IJ SOLN: 4.0000 mg | Freq: Four times a day (QID) | INTRAMUSCULAR | Status: DC | PRN

## 2018-09-12 MED ORDER — ACETAMINOPHEN 325 MG PO TABS: 650.0000 mg | ORAL_TABLET | Freq: Four times a day (QID) | ORAL | Status: DC | PRN

## 2018-09-12 MED ORDER — SODIUM CHLORIDE 0.9% FLUSH
3.0000 mL | Freq: Two times a day (BID) | INTRAVENOUS | Status: DC
  Administered 2018-09-12 (×2): 3 mL via INTRAVENOUS

## 2018-09-12 MED ORDER — RESOURCE THICKENUP CLEAR PO POWD
ORAL | Status: DC | PRN
  Filled 2018-09-12: qty 125

## 2018-09-12 MED ORDER — ASPIRIN EC 325 MG PO TBEC
325.0000 mg | DELAYED_RELEASE_TABLET | Freq: Every day | ORAL | Status: DC
  Administered 2018-09-12: 325 mg via ORAL
  Filled 2018-09-12 (×2): qty 1

## 2018-09-12 MED ORDER — ONDANSETRON HCL 4 MG PO TABS: 4.0000 mg | ORAL_TABLET | Freq: Four times a day (QID) | ORAL | Status: DC | PRN

## 2018-09-12 MED ORDER — LEVETIRACETAM 500 MG PO TABS
500.0000 mg | ORAL_TABLET | Freq: Two times a day (BID) | ORAL | Status: DC
  Administered 2018-09-12: 500 mg via ORAL
  Filled 2018-09-12 (×3): qty 1

## 2018-09-12 MED ORDER — AMLODIPINE BESYLATE 5 MG PO TABS
10.0000 mg | ORAL_TABLET | Freq: Every day | ORAL | Status: DC
  Administered 2018-09-12: 10 mg via ORAL
  Filled 2018-09-12: qty 2

## 2018-09-12 MED ORDER — FAMOTIDINE 20 MG PO TABS
20.0000 mg | ORAL_TABLET | Freq: Every day | ORAL | Status: DC
  Administered 2018-09-12: 20 mg via ORAL
  Filled 2018-09-12: qty 1

## 2018-09-12 MED ORDER — ASPIRIN 325 MG PO TABS
325.0000 mg | ORAL_TABLET | Freq: Every day | ORAL | Status: DC
  Filled 2018-09-12: qty 1

## 2018-09-12 MED ORDER — ATORVASTATIN CALCIUM 10 MG PO TABS: 10.0000 mg | ORAL_TABLET | Freq: Every day | ORAL | Status: DC

## 2018-09-12 MED ORDER — ENOXAPARIN SODIUM 40 MG/0.4ML ~~LOC~~ SOLN
40.0000 mg | SUBCUTANEOUS | Status: DC
  Administered 2018-09-12: 40 mg via SUBCUTANEOUS
  Filled 2018-09-12: qty 0.4

## 2018-09-12 MED ORDER — IOHEXOL 350 MG/ML SOLN
100.0000 mL | Freq: Once | INTRAVENOUS | Status: AC | PRN
  Administered 2018-09-12: 100 mL via INTRAVENOUS

## 2018-09-12 NOTE — Evaluation (Signed)
Clinical/Bedside Swallow Evaluation Patient Details  Name: Dillon Garcia MRN: [id number] Date of Birth: 1955-12-22  Today's Date: 09/12/2018 Time: SLP Start Time (ACUTE ONLY): 0957 SLP Stop Time (ACUTE ONLY): 1025 SLP Time Calculation (min) (ACUTE ONLY): 28 min  Past Medical History:  Past Medical History:  Diagnosis Date  . GERD (gastroesophageal reflux disease)   . Heart disease   . HTN (hypertension)   . Hyperlipidemia   . Hypothyroid   . Infectious endocarditis 2012  . Mitral valve disease 2012   Past Surgical History:  Past Surgical History:  Procedure Laterality Date  . MEDIAN STERNOTOMY     CABG? valve replacement?  Marland Kitchen MITRAL VALVE REPLACEMENT  2012   HPI:  Pt is a 63 yo male admitted from SNF with intermittent CP, found to be COVID positive. Pt had a FEES 09/12/17 with significant oral dysphagia and sensed aspiration, although he couldn't fully clear his airway. Purees/honey thick liquids were recommended at that time. PMH: pontocerebellar CVA, dysphagia, GERD, HTN, MVR   Assessment / Plan / Recommendation Clinical Impression  Pt has a h/o dysphagia with most recent testing from one year prior as can be found in chart review. Upon further discussion with the pt, he acknowledges that he has remained on pureed solids but that his liquids had been advanced. He describes them as a combination of thin and nectar thick, and it appears as though he was on nectar thick liquids but able to have thin water per protocol. He had started working with an SLP at his SNF again just prior to this admission.   Today he appears to be somewhat deconditioned from his norm, as he says that he usually self-feeds but he is requiring assistance to do so at the moment. His oral phase remains slow and likely somewhat passive - grossly consistent with prior testing. I suspect that some of his improvement with airway protection comes from resolution of post-extubation changes of his glottis that were  observed on FEES. He does have a cough with thin liquids today, but only via straw sips. Cup sips of water and larger volumes of nectar thick liquids and purees do not elicit any overt concern for aspiration.   Education was provided to the pt about using additional precaution while acutely ill and deconditioned. Although he looked appropriate with cup sips of thin liquids, he is at increased risk given his dependence for feeding and it is likely not the most appropriate time to take a risk with upgrading. He is in agreement. For now, will continue what he has been doing at SNF: Dys 1 (puree) diet and nectar thick liquids, with thin water via cup allowed in between meals after oral care has been completed. Will f/u while inpatient to determine tolerance, with additional f/u recommended at SNF upon discharge to consider potential upgrades.  SLP Visit Diagnosis: Dysphagia, oropharyngeal phase (R13.12)    Aspiration Risk  Mild aspiration risk;Moderate aspiration risk    Diet Recommendation Dysphagia 1 (Puree);Nectar-thick liquid;Other (Comment)(thin water okay btwn meals per water protocol)   Liquid Administration via: Cup;Straw(straws okay with nectar; NO straws with water) Medication Administration: Crushed with puree Supervision: Staff to assist with self feeding Compensations: Slow rate;Small sips/bites Postural Changes: Seated upright at 90 degrees    Other  Recommendations Oral Care Recommendations: Oral care BID;Oral care prior to ice chip/H20 Other Recommendations: Order thickener from pharmacy;Prohibited food (jello, ice cream, thin soups)   Follow up Recommendations Skilled Nursing facility  Frequency and Duration min 2x/week  2 weeks       Prognosis Prognosis for Safe Diet Advancement: Fair Barriers to Reach Goals: Time post onset      Swallow Study   General HPI: Pt is a 63 yo male admitted from SNF with intermittent CP, found to be COVID positive. Pt had a FEES 09/12/17  with significant oral dysphagia and sensed aspiration, although he couldn't fully clear his airway. Purees/honey thick liquids were recommended at that time. PMH: pontocerebellar CVA, dysphagia, GERD, HTN, MVR Type of Study: Bedside Swallow Evaluation Previous Swallow Assessment: see HPI Diet Prior to this Study: Dysphagia 1 (puree);Thin liquids Temperature Spikes Noted: No Respiratory Status: Room air History of Recent Intubation: No Behavior/Cognition: Alert;Cooperative;Pleasant mood Oral Cavity Assessment: Within Functional Limits Oral Care Completed by SLP: No Oral Cavity - Dentition: Adequate natural dentition Vision: Functional for self-feeding Self-Feeding Abilities: Total assist(of note, pt says he usually feeds himself) Patient Positioning: Upright in bed Baseline Vocal Quality: Normal Volitional Swallow: Able to elicit    Oral/Motor/Sensory Function     Ice Chips Ice chips: Not tested   Thin Liquid Thin Liquid: Impaired Presentation: Cup;Straw Pharyngeal  Phase Impairments: Change in Vital Signs;Cough - Immediate    Nectar Thick Nectar Thick Liquid: Impaired Presentation: Straw Pharyngeal Phase Impairments: Change in Vital Signs   Honey Thick Honey Thick Liquid: Not tested   Puree Puree: Impaired Presentation: Spoon Oral Phase Functional Implications: Prolonged oral transit   Solid     Solid: Not tested      Dillon Garcia Ringel 09/12/2018,11:36 AM  Dillon Garcia, M.A. [place] Acute Environmental education officer [phone] Office [phone]

## 2018-09-12 NOTE — Care Management CC44 (Signed)
Condition Code 44 Documentation Completed  Patient Details  Name: Dillon Garcia MRN: [id number] Date of Birth: 1955-07-16   Condition Code 44 given:  Yes Patient signature on Condition Code 44 notice:  Yes(Verbal permission given by the patient for the CM to sign) Documentation of 2 MD's agreement:  Yes Code 44 added to claim:  Yes    Midge Minium RN, BSN, NCM-BC, ACM-RN [phone] 09/12/2018, 4:11 PM

## 2018-09-12 NOTE — Progress Notes (Signed)
Patient rested comfortably throughout the night. VSS. No c/o pain or discomfort.

## 2018-09-12 NOTE — Discharge Summary (Addendum)
Physician Discharge Summary  ACEL NATZKE VQM:[id number] DOB: Dec 28, 1955 DOA: 09/11/2018  PCP: Patient, No Pcp Per  Admit date: 09/11/2018 Discharge date: 09/12/2018  Admitted From: [facility] Disposition: Skilled nursing facility  Recommendations for Outpatient Follow-up:  1. Follow up with PCP in 1-2 weeks 2. Please obtain BMP/CBC in one week 3. CT chest shows left upper lobe nodule. Will need PET CT and referral to thoracic surgery for further evaluation when feasible   Discharge Condition: Stable CODE STATUS: Full code Diet recommendation: Heart healthy, dysphagia 1 diet with nectar thick liquids  Brief/Interim Summary: 63 year old male with a history of hypertension, MVR, GERD, previous stroke with residual dysarthria, who is a resident of a nursing home.  He is nonambulatory at baseline.  He presents to the hospital with complaints of right-sided intermittent chest discomfort.  He was noted to require supplemental oxygen.  He recently tested positive for COVID-19.  Chest x-ray did not show any acute findings.  Troponin and EKG were unremarkable.  D-dimer was noted to be elevated, but CTA chest negative for PE. CT did show left upper lobe nodule, could not rule out bronchogenic carcinoma. He will need PET-CT for further evaluation and referral to thoracic surgery when feasible.Marland Kitchen  He was monitored in the hospital were inflammatory markers were noted to be unremarkable.  Patient remained stable on room air and did not have any recurrent shortness of breath.  He has not had any recurrent chest discomfort since admission.  At this point, he appears stable and can return to skilled nursing facility.  Discharge Diagnoses:  Principal Problem:   COVID-19 virus infection Active Problems:   Benign essential HTN   Seizures (HCC)   GERD (gastroesophageal reflux disease)   Hyperlipidemia   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   Acute respiratory failure with hypoxia (HCC)    Acute respiratory disease due to COVID-19 virus    Discharge Instructions   Allergies as of 09/12/2018   No Known Allergies     Medication List    TAKE these medications   acetaminophen 325 MG tablet Commonly known as:  TYLENOL Take 2 tablets (650 mg total) by mouth every 4 (four) hours as needed for mild pain (or temp > 37.5 C (99.5 F)).   amLODipine 10 MG tablet Commonly known as:  NORVASC Take 10 mg by mouth daily.   ammonium lactate 12 % lotion Commonly known as:  LAC-HYDRIN Apply 1 application topically daily.   aspirin 325 MG tablet Take 1 tablet (325 mg total) by mouth daily.   atorvastatin 10 MG tablet Commonly known as:  LIPITOR Take 1 tablet (10 mg total) by mouth daily at 6 PM.   famotidine 20 MG tablet Commonly known as:  PEPCID Take 1 tablet (20 mg total) by mouth 2 (two) times daily. What changed:  when to take this   gabapentin 100 MG capsule Commonly known as:  NEURONTIN Take 200 mg by mouth 2 (two) times daily.   levETIRAcetam 500 MG tablet Commonly known as:  KEPPRA Take 500 mg by mouth 2 (two) times daily.   multivitamin Tabs tablet Take 1 tablet by mouth daily.   multivitamin with minerals Tabs tablet Take 1 tablet by mouth daily.   Resource ThickenUp Clear Powd Thicken liquids to honey thick texture   senna-docusate 8.6-50 MG tablet Commonly known as:  Senokot-S Take 1 tablet by mouth at bedtime as needed for mild constipation.       No Known Allergies  Consultations:     Procedures/Studies: Dg Chest Port 1 View  Result Date: 09/11/2018 CLINICAL DATA:  63 year old male with a history chest pain EXAM: PORTABLE CHEST 1 VIEW COMPARISON:  09/11/2017, 09/07/2017 FINDINGS: Cardiomediastinal silhouette unchanged in size and contour. No pneumothorax. No pleural effusion. Coarsened interstitial markings of the lungs. No confluent airspace disease. Surgical changes of median sternotomy. No displaced fracture IMPRESSION: Chronic lung  changes without evidence of acute cardiopulmonary disease Electronically Signed   By: Corrie Mckusick D.O.   On: 09/11/2018 13:21       Subjective: No further chest discomfort since admission.  No shortness of breath.  Off oxygen.  Discharge Exam: Vitals:   09/12/18 0000 09/12/18 0453 09/12/18 0812 09/12/18 1346  BP: 112/73 112/78 115/81   Pulse: 70 69    Resp: 11 18    Temp:  99.2 F (37.3 C) 98 F (36.7 C) 98.4 F (36.9 C)  TempSrc:  Axillary Axillary Oral  SpO2: 100% 100%      General: Pt is alert, awake, not in acute distress Cardiovascular: RRR, S1/S2 +, no rubs, no gallops Respiratory: CTA bilaterally, no wheezing, no rhonchi Abdominal: Soft, NT, ND, bowel sounds + Extremities: no edema, no cyanosis    The results of significant diagnostics from this hospitalization (including imaging, microbiology, ancillary and laboratory) are listed below for reference.     Microbiology: Recent Results (from the past 240 hour(s))  SARS Coronavirus 2 (CEPHEID - Performed in [place] hospital lab), Hosp Order     Status: Abnormal   Collection Time: 09/11/18  1:34 PM  Result Value Ref Range Status   SARS Coronavirus 2 POSITIVE (A) NEGATIVE Final    Comment: RESULT CALLED TO, READ BACK BY AND VERIFIED WITH: Leonides Schanz RN AT 1500 09/11/18 SG (NOTE) If result is NEGATIVE SARS-CoV-2 target nucleic acids are NOT DETECTED. The SARS-CoV-2 RNA is generally detectable in upper and lower  respiratory specimens during the acute phase of infection. The lowest  concentration of SARS-CoV-2 viral copies this assay can detect is 250  copies / mL. A negative result does not preclude SARS-CoV-2 infection  and should not be used as the sole basis for treatment or other  patient management decisions.  A negative result may occur with  improper specimen collection / handling, submission of specimen other  than nasopharyngeal swab, presence of viral mutation(s) within the  areas targeted by  this assay, and inadequate number of viral copies  (<250 copies / mL). A negative result must be combined with clinical  observations, patient history, and epidemiological information. If result is POSITIVE SARS-CoV-2 target nucleic acids are DETECTED. T he SARS-CoV-2 RNA is generally detectable in upper and lower  respiratory specimens during the acute phase of infection.  Positive  results are indicative of active infection with SARS-CoV-2.  Clinical  correlation with patient history and other diagnostic information is  necessary to determine patient infection status.  Positive results do  not rule out bacterial infection or co-infection with other viruses. If result is PRESUMPTIVE POSTIVE SARS-CoV-2 nucleic acids MAY BE PRESENT.   A presumptive positive result was obtained on the submitted specimen  and confirmed on repeat testing.  While 2019 novel coronavirus  (SARS-CoV-2) nucleic acids may be present in the submitted sample  additional confirmatory testing may be necessary for epidemiological  and / or clinical management purposes  to differentiate between  SARS-CoV-2 and other Sarbecovirus currently known to infect humans.  If clinically indicated additional testing with an alternate  test  methodology [phone]) is  advised. The SARS-CoV-2 RNA is generally  detectable in upper and lower respiratory specimens during the acute  phase of infection. The expected result is Negative. Fact Sheet for Patients:  StrictlyIdeas.no Fact Sheet for Healthcare Providers: BankingDealers.co.za This test is not yet approved or cleared by the [place] FDA and has been authorized for detection and/or diagnosis of SARS-CoV-2 by FDA under an Emergency Use Authorization (EUA).  This EUA will remain in effect (meaning this test can be used) for the duration of the COVID-19 declaration under Section 564(b)(1) of the Act, 21 U.S.C. section  360bbb-3(b)(1), unless the authorization is terminated or revoked sooner. Performed at [facility], [address] [place], [place] [address]      Labs: BNP (last 3 results) No results for input(s): BNP in the last 8760 hours. Basic Metabolic Panel: Recent Labs  Lab 09/11/18 1303 09/12/18 0340  NA 139 141  K 4.0 3.9  CL 106 106  CO2 27 28  GLUCOSE 91 81  BUN 11 15  CREATININE 0.84 0.83  CALCIUM 8.8* 9.2  MG  --  2.0   Liver Function Tests: Recent Labs  Lab 09/11/18 1303 09/12/18 0340  AST 39 43*  ALT 40 49*  ALKPHOS 79 79  BILITOT 0.5 0.8  PROT 6.4* 6.5  ALBUMIN 3.4* 3.3*   Recent Labs  Lab 09/11/18 1534  LIPASE 40   No results for input(s): AMMONIA in the last 168 hours. CBC: Recent Labs  Lab 09/11/18 1303 09/12/18 0340  WBC 6.5 7.7  NEUTROABS  --  4.5  HGB 13.7 14.0  HCT 42.5 44.1  MCV 84.2 86.8  PLT 222 215   Cardiac Enzymes: Recent Labs  Lab 09/11/18 1303 09/11/18 1534  TROPONINI <0.03 <0.03   BNP: Invalid input(s): POCBNP CBG: No results for input(s): GLUCAP in the last 168 hours. D-Dimer Recent Labs    09/12/18 0340  DDIMER 2.54*   Hgb A1c Recent Labs    09/12/18 0340  HGBA1C 6.0*   Lipid Profile No results for input(s): CHOL, HDL, LDLCALC, TRIG, CHOLHDL, LDLDIRECT in the last 72 hours. Thyroid function studies No results for input(s): TSH, T4TOTAL, T3FREE, THYROIDAB in the last 72 hours.  Invalid input(s): FREET3 Anemia work up Recent Labs    09/11/18 1302 09/12/18 0340  FERRITIN 351* 350*   Urinalysis    Component Value Date/Time   COLORURINE YELLOW (A) 09/04/2017 0602   APPEARANCEUR CLEAR (A) 09/04/2017 0602   LABSPEC 1.015 09/04/2017 0602   PHURINE 6.0 09/04/2017 0602   GLUCOSEU NEGATIVE 09/04/2017 0602   HGBUR MODERATE (A) 09/04/2017 0602   BILIRUBINUR NEGATIVE 09/04/2017 0602   KETONESUR 5 (A) 09/04/2017 0602   PROTEINUR NEGATIVE 09/04/2017 0602   NITRITE NEGATIVE 09/04/2017 0602    LEUKOCYTESUR NEGATIVE 09/04/2017 0602   Sepsis Labs Invalid input(s): PROCALCITONIN,  WBC,  LACTICIDVEN Microbiology Recent Results (from the past 240 hour(s))  SARS Coronavirus 2 (CEPHEID - Performed in [place] hospital lab), Hosp Order     Status: Abnormal   Collection Time: 09/11/18  1:34 PM  Result Value Ref Range Status   SARS Coronavirus 2 POSITIVE (A) NEGATIVE Final    Comment: RESULT CALLED TO, READ BACK BY AND VERIFIED WITH: Leonides Schanz RN AT 1500 09/11/18 SG (NOTE) If result is NEGATIVE SARS-CoV-2 target nucleic acids are NOT DETECTED. The SARS-CoV-2 RNA is generally detectable in upper and lower  respiratory specimens during the acute phase of infection. The lowest  concentration  of SARS-CoV-2 viral copies this assay can detect is 250  copies / mL. A negative result does not preclude SARS-CoV-2 infection  and should not be used as the sole basis for treatment or other  patient management decisions.  A negative result may occur with  improper specimen collection / handling, submission of specimen other  than nasopharyngeal swab, presence of viral mutation(s) within the  areas targeted by this assay, and inadequate number of viral copies  (<250 copies / mL). A negative result must be combined with clinical  observations, patient history, and epidemiological information. If result is POSITIVE SARS-CoV-2 target nucleic acids are DETECTED. T he SARS-CoV-2 RNA is generally detectable in upper and lower  respiratory specimens during the acute phase of infection.  Positive  results are indicative of active infection with SARS-CoV-2.  Clinical  correlation with patient history and other diagnostic information is  necessary to determine patient infection status.  Positive results do  not rule out bacterial infection or co-infection with other viruses. If result is PRESUMPTIVE POSTIVE SARS-CoV-2 nucleic acids MAY BE PRESENT.   A presumptive positive result was obtained on  the submitted specimen  and confirmed on repeat testing.  While 2019 novel coronavirus  (SARS-CoV-2) nucleic acids may be present in the submitted sample  additional confirmatory testing may be necessary for epidemiological  and / or clinical management purposes  to differentiate between  SARS-CoV-2 and other Sarbecovirus currently known to infect humans.  If clinically indicated additional testing with an alternate test  methodology [phone]) is  advised. The SARS-CoV-2 RNA is generally  detectable in upper and lower respiratory specimens during the acute  phase of infection. The expected result is Negative. Fact Sheet for Patients:  StrictlyIdeas.no Fact Sheet for Healthcare Providers: BankingDealers.co.za This test is not yet approved or cleared by the [place] FDA and has been authorized for detection and/or diagnosis of SARS-CoV-2 by FDA under an Emergency Use Authorization (EUA).  This EUA will remain in effect (meaning this test can be used) for the duration of the COVID-19 declaration under Section 564(b)(1) of the Act, 21 U.S.C. section 360bbb-3(b)(1), unless the authorization is terminated or revoked sooner. Performed at [facility], [address], [place], [place] [address]      Time coordinating discharge: 56mins  SIGNED:   Kathie Dike, MD  [facility] 09/12/2018, 2:47 PM   If 7PM-7AM, please contact night-coverage [email or web address]

## 2018-09-12 NOTE — TOC Transition Note (Addendum)
Transition of Care [facility]) - CM/SW Discharge Note   Patient Details  Name: Dillon Garcia MRN: [id number] Date of Birth: 1955-06-08  Transition of Care [facility]) CM/SW Contact:  Alberteen Sam, [place] Phone Number: [phone] 09/12/2018, 3:47 PM   Clinical Narrative:     Patient will DC to: [place] Anticipated DC date: 09/12/2018 Family notified:Danny Transport DU:[facility]  Per MD patient ready for DC to [facility] . RN, patient, patient's family, and facility notified of DC. Discharge Summary sent to facility. RN given number for report [phone]. DC packet on chart. Ambulance transport requested for patient for 5:00 pm.  Pricilla Riffle, LCSW [phone]   Final next level of care: Skilled Nursing Facility Barriers to Discharge: No Barriers Identified   Patient Goals and CMS Choice   CMS Medicare.gov Compare Post Acute Care list provided to:: Patient Represenative (must comment)(Danny (brother)) Choice offered to / list presented to : Sibling(Danny)  Discharge Placement PASRR number recieved: 09/12/18            Patient [facility] bed at: [facility] Patient to be transferred to facility by: [place] Name of family member notified: Danny Patient and family notified of of transfer: 09/12/18  Discharge Plan and Services     Post Acute Care Choice: [place]                               Social Determinants of Health (SDOH) Interventions     Readmission Risk Interventions No flowsheet data found.

## 2018-09-12 NOTE — H&P (Signed)
History and Physical   Dillon Garcia HQI:[id number] DOB: 01-12-1956 DOA: 09/11/2018  Referring MD/NP/PA: Dorthy Cooler PCP: Patient, No Pcp Per  Patient coming from: [facility]  Chief Complaint: Fever, shortness of breath, chest pain  HPI: Dillon Garcia is a 63 y.o. male with a history of HTN, MVR, GERD, and pontocerebellar CVA with residual deficits who presented from [facility] due to 2 days of intermittent chest pain. This was reportedly improved with SL nitroglycerin and not worsened with exertion. He was given supplemental oxygen and transferred to the ED 5/26. He's had no cough, but does think he intermittently chokes on food. He reports feeling febrile off and on with generalized muscle weakness. In the ED he maintained saturations with 2L O2 but remained tachypneic. Troponin was normal, ECG nonischemic, and the patient had no further bouts of chest pain. When coronavirus testing was positive, admission was requested at [facility].   Review of Systems: Limited due to dysarthria which is chronic, no runny nose, headache, muscle aches, joint aches, leg swelling, PND, palpitations, and per HPI. All others reviewed and are negative.   Past Medical History:  Diagnosis Date  . GERD (gastroesophageal reflux disease)   . Heart disease   . HTN (hypertension)   . Hyperlipidemia   . Hypothyroid   . Infectious endocarditis 2012  . Mitral valve disease 2012   Past Surgical History:  Procedure Laterality Date  . MEDIAN STERNOTOMY     CABG? valve replacement?  Marland Kitchen MITRAL VALVE REPLACEMENT  2012   - Former smoker, lives in [place].  reports that he has quit smoking. He uses smokeless tobacco. He reports previous alcohol use. He reports previous drug use. No Known Allergies History reviewed. No pertinent family history. - Family history otherwise reviewed and not pertinent.  Prior to Admission medications   Medication Sig Start Date End Date Taking? Authorizing Provider  acetaminophen (TYLENOL)  325 MG tablet Take 2 tablets (650 mg total) by mouth every 4 (four) hours as needed for mild pain (or temp > 37.5 C (99.5 F)). Patient not taking: Reported on 09/11/2018 09/15/17   Rinehuls, Early Chars, PA-C  amLODipine (NORVASC) 10 MG tablet Take 10 mg by mouth daily.     [provider]  ammonium lactate (LAC-HYDRIN) 12 % lotion Apply 1 application topically daily.    [provider]  aspirin 325 MG tablet Take 1 tablet (325 mg total) by mouth daily. 09/16/17   Rinehuls, Early Chars, PA-C  atorvastatin (LIPITOR) 10 MG tablet Take 1 tablet (10 mg total) by mouth daily at 6 PM. 09/15/17   Rinehuls, Early Chars, PA-C  famotidine (PEPCID) 20 MG tablet Take 1 tablet (20 mg total) by mouth 2 (two) times daily. Patient taking differently: Take 20 mg by mouth daily.  09/15/17   Rinehuls, Early Chars, PA-C  gabapentin (NEURONTIN) 100 MG capsule Take 200 mg by mouth 2 (two) times daily.    [provider]  levETIRAcetam (KEPPRA) 500 MG tablet Take 500 mg by mouth 2 (two) times daily.    [provider]  Maltodextrin-Xanthan Gum ([place]) POWD Thicken liquids to honey thick texture Patient not taking: Reported on 09/11/2018 09/15/17   Rinehuls, Early Chars, PA-C  Multiple Vitamin (MULTIVITAMIN WITH MINERALS) TABS tablet Take 1 tablet by mouth daily.    [provider]  multivitamin (PROSIGHT) TABS tablet Take 1 tablet by mouth daily. Patient not taking: Reported on 09/11/2018 09/16/17   Rinehuls, Early Chars, PA-C  senna-docusate (SENOKOT-S) 8.6-50 MG tablet Take 1 tablet by mouth at bedtime as needed for mild constipation. Patient not taking: Reported on 09/11/2018 09/15/17   Felecia Shelling    Physical Exam: Vitals:   09/11/18 2254 09/12/18 0000  BP: 115/62 112/73  Pulse: 65 70  Resp: 13 11  Temp: 98.4 F (36.9 C)   TempSrc: Oral   SpO2: 100% 100%   Constitutional: 63 y.o. male in no distress, calm demeanor Eyes: Lids and conjunctivae normal, PERRL ENMT:  Mucous membranes are moist. Posterior pharynx clear of any exudate or lesions. Poor dentition.  Neck: normal, supple, no masses, no thyromegaly Respiratory: Non-labored breathing 2L by [place] without accessory muscle use. Clear breath sounds to auscultation bilaterally Cardiovascular: Regular rate and rhythm, no murmurs, rubs, or gallops. No carotid bruits. No JVD. No LE edema. 2+ pedal pulses. Abdomen: Normoactive bowel sounds. No tenderness, non-distended, and no masses palpated. No hepatosplenomegaly. GU: No indwelling catheter Musculoskeletal: No clubbing / cyanosis. No joint deformity upper and lower extremities. Good ROM, no contractures. Normal muscle tone.  Skin: Warm, dry. No rashes, wounds, or ulcers. No significant lesions noted.  Neurologic: Dysarthria with right-sided weakness in UE and LE, ataxia. No new focal deficits in motor strength or sensation in all extremities.  Psychiatric: Alert, oriented. Fair judgment and insight. Mood euthymic with congruent affect.   Labs on Admission: I have personally reviewed following labs and imaging studies  CBC: Recent Labs  Lab 09/11/18 1303  WBC 6.5  HGB 13.7  HCT 42.5  MCV 84.2  PLT 263   Basic Metabolic Panel: Recent Labs  Lab 09/11/18 1303  NA 139  K 4.0  CL 106  CO2 27  GLUCOSE 91  BUN 11  CREATININE 0.84  CALCIUM 8.8*   GFR: Estimated Creatinine Clearance: 78.3 mL/min (by C-G formula based on SCr of 0.84 mg/dL). Liver Function Tests: Recent Labs  Lab 09/11/18 1303  AST 39  ALT 40  ALKPHOS 79  BILITOT 0.5  PROT 6.4*  ALBUMIN 3.4*   Recent Labs  Lab 09/11/18 1534  LIPASE 40   No results for input(s): AMMONIA in the last 168 hours. Coagulation Profile: No results for input(s): INR, PROTIME in the last 168 hours. Cardiac Enzymes: Recent Labs  Lab 09/11/18 1303 09/11/18 1534  TROPONINI <0.03 <0.03   BNP (last 3 results) No results for input(s): PROBNP in the last 8760 hours. HbA1C: No results for  input(s): HGBA1C in the last 72 hours. CBG: No results for input(s): GLUCAP in the last 168 hours. Lipid Profile: No results for input(s): CHOL, HDL, LDLCALC, TRIG, CHOLHDL, LDLDIRECT in the last 72 hours. Thyroid Function Tests: No results for input(s): TSH, T4TOTAL, FREET4, T3FREE, THYROIDAB in the last 72 hours. Anemia Panel: Recent Labs    09/11/18 1302  FERRITIN 351*   Urine analysis:    Component Value Date/Time   COLORURINE YELLOW (A) 09/04/2017 0602   APPEARANCEUR CLEAR (A) 09/04/2017 0602   LABSPEC 1.015 09/04/2017 0602   PHURINE 6.0 09/04/2017 0602   GLUCOSEU NEGATIVE 09/04/2017 0602   HGBUR MODERATE (A) 09/04/2017 0602   BILIRUBINUR NEGATIVE 09/04/2017 0602   KETONESUR 5 (A) 09/04/2017 0602   PROTEINUR NEGATIVE 09/04/2017 0602   NITRITE NEGATIVE 09/04/2017 0602   LEUKOCYTESUR NEGATIVE 09/04/2017 0602    Recent Results (from the past 240 hour(s))  SARS Coronavirus 2 (CEPHEID - Performed in [place] hospital lab), Hosp Order     Status: Abnormal   Collection Time: 09/11/18  1:34  PM  Result Value Ref Range Status   SARS Coronavirus 2 POSITIVE (A) NEGATIVE Final    Comment: RESULT CALLED TO, READ BACK BY AND VERIFIED WITH: Leonides Schanz RN AT 1500 09/11/18 SG (NOTE) If result is NEGATIVE SARS-CoV-2 target nucleic acids are NOT DETECTED. The SARS-CoV-2 RNA is generally detectable in upper and lower  respiratory specimens during the acute phase of infection. The lowest  concentration of SARS-CoV-2 viral copies this assay can detect is 250  copies / mL. A negative result does not preclude SARS-CoV-2 infection  and should not be used as the sole basis for treatment or other  patient management decisions.  A negative result may occur with  improper specimen collection / handling, submission of specimen other  than nasopharyngeal swab, presence of viral mutation(s) within the  areas targeted by this assay, and inadequate number of viral copies  (<250 copies / mL). A  negative result must be combined with clinical  observations, patient history, and epidemiological information. If result is POSITIVE SARS-CoV-2 target nucleic acids are DETECTED. T he SARS-CoV-2 RNA is generally detectable in upper and lower  respiratory specimens during the acute phase of infection.  Positive  results are indicative of active infection with SARS-CoV-2.  Clinical  correlation with patient history and other diagnostic information is  necessary to determine patient infection status.  Positive results do  not rule out bacterial infection or co-infection with other viruses. If result is PRESUMPTIVE POSTIVE SARS-CoV-2 nucleic acids MAY BE PRESENT.   A presumptive positive result was obtained on the submitted specimen  and confirmed on repeat testing.  While 2019 novel coronavirus  (SARS-CoV-2) nucleic acids may be present in the submitted sample  additional confirmatory testing may be necessary for epidemiological  and / or clinical management purposes  to differentiate between  SARS-CoV-2 and other Sarbecovirus currently known to infect humans.  If clinically indicated additional testing with an alternate test  methodology [phone]) is  advised. The SARS-CoV-2 RNA is generally  detectable in upper and lower respiratory specimens during the acute  phase of infection. The expected result is Negative. Fact Sheet for Patients:  StrictlyIdeas.no Fact Sheet for Healthcare Providers: BankingDealers.co.za This test is not yet approved or cleared by the [place] FDA and has been authorized for detection and/or diagnosis of SARS-CoV-2 by FDA under an Emergency Use Authorization (EUA).  This EUA will remain in effect (meaning this test can be used) for the duration of the COVID-19 declaration under Section 564(b)(1) of the Act, 21 U.S.C. section 360bbb-3(b)(1), unless the authorization is terminated or revoked sooner. Performed  at [facility], [address] [place], [place] [address]      Radiological Exams on Admission: Dg Chest [facility] 1 View  Result Date: 09/11/2018 CLINICAL DATA:  63 year old male with a history chest pain EXAM: PORTABLE CHEST 1 VIEW COMPARISON:  09/11/2017, 09/07/2017 FINDINGS: Cardiomediastinal silhouette unchanged in size and contour. No pneumothorax. No pleural effusion. Coarsened interstitial markings of the lungs. No confluent airspace disease. Surgical changes of median sternotomy. No displaced fracture IMPRESSION: Chronic lung changes without evidence of acute cardiopulmonary disease Electronically Signed   By: Corrie Mckusick D.O.   On: 09/11/2018 13:21    EKG: Independently reviewed. NSR, no ST elevation.  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  - Continue airborne, contact precautions. PPE including surgical gown, gloves, face shield, cap, shoe covers, and N-95 used during this encounter in a negative pressure room.  - Check daily labs: CBC w/diff, CMP, d-dimer, fibrinogen, ferritin, LDH, CRP - Troponin is neg x2. Hep B SAg is ordered.  - Enoxaparin prophylactic dose.  - Maintain euvolemia/net negative.  - Avoid NSAIDs - Recommend proning and aggressive use of incentive spirometry.  History of CVA with residual dysphagia, dysarthria: Left cerebellar CVA - Dysphagia diet, SLP eval. Pt reports not using thickener for liquids. - Check PCT, low threshold to add abx (e.g. unasyn) for aspiration (has history of this) - Continue ASA, statin  HTN:  - Continue norvasc  Seizure disorder:  -  Continue keppra  HLD:  - Continue statin. LFTs wnl.  Chronic pain:  - Continue gabapentin  GERD:  - Continue pepcid  DVT prophylaxis: Lovenox  Code Status: Full  Family Communication: None at bedside, family spoke with RN on arrival Disposition Plan: Uncertain Consults called: None  Admission status: Inpatient    Patrecia Pour, MD [facility] [email or web address] Password [facility] 09/12/2018, 12:56 AM

## 2018-09-12 NOTE — Discharge Instructions (Signed)
Recommendations for Outpatient Follow-up:  1. Follow up with PCP in 1-2 weeks 2. Please obtain BMP/CBC in one week

## 2018-09-12 NOTE — TOC Initial Note (Signed)
Transition of Care [facility]) - Initial/Assessment Note    Patient Details  Name: Dillon Garcia MRN: [id number] Date of Birth: 07-23-1955  [facility]) CM/SW Contact:    Alberteen Sam, [place] Phone Number: [phone] 09/12/2018, 1:41 PM  Clinical Narrative:                  CSW consulted with patient's brother [name] to inform of CSW role and introduce self. [name] confirms patient is from [facility] and is in agreement with patient returning. [name] reports being thankful for being kept up to date as he has heard from MD this morning. CSW informed [name] that patient will likely discharge to [facility] today, [name] reports understanding and requests to be notified when Corey Harold is set up for transportation. CSW will continue to follow for discharge.   Expected Discharge Plan: Skilled Nursing Facility Barriers to Discharge: Continued Medical Work up   Patient Goals and CMS Choice   CMS Medicare.gov Compare Post Acute Care list provided to:: Patient Represenative (must comment)(Danny (brother)) Choice offered to / list presented to : Sibling  Expected Discharge Plan and Services Expected Discharge Plan: [place] Acute Care Choice: [place] Living arrangements for the past 2 months: Skilled Nursing Facility([facility]) Expected Discharge Date: 09/15/18                                    Prior Living Arrangements/Services Living arrangements for the past 2 months: Skilled Nursing Facility([place] [facility]) Lives with:: Self Patient language and need for interpreter reviewed:: Yes Do you feel safe going back to the place where you live?: Yes      Need for Family Participation in Patient Care: Yes (Comment) Care giver support system in place?: Yes (comment)   Criminal Activity/Legal Involvement Pertinent to Current Situation/Hospitalization: No - Comment as needed  Activities of Daily Living Home Assistive Devices/Equipment: Wheelchair ADL  Screening (condition at time of admission) Patient's cognitive ability adequate to safely complete daily activities?: Yes Is the patient deaf or have difficulty hearing?: No Does the patient have difficulty seeing, even when wearing glasses/contacts?: No Does the patient have difficulty concentrating, remembering, or making decisions?: No Patient able to express need for assistance with ADLs?: Yes Does the patient have difficulty dressing or bathing?: Yes Independently performs ADLs?: No Communication: Independent Dressing (OT): Needs assistance Is this a change from baseline?: Pre-admission baseline Grooming: Needs assistance Is this a change from baseline?: Pre-admission baseline Feeding: Independent Bathing: Needs assistance Is this a change from baseline?: Pre-admission baseline Toileting: Needs assistance Is this a change from baseline?: Pre-admission baseline In/Out Bed: Needs assistance Is this a change from baseline?: Pre-admission baseline Walks in Home: Dependent Is this a change from baseline?: Pre-admission baseline Does the patient have difficulty walking or climbing stairs?: Yes Weakness of Legs: Right Weakness of Arms/Hands: Right  Permission Sought/Granted Permission sought to share information with : Case Manager, Customer service manager, Family Supports Permission granted to share information with : Yes, Verbal Permission Granted  Share Information with NAME: [name]  Permission granted to share info w AGENCY: SNFs  Permission granted to share info w Relationship: brother  Permission granted to share info w Contact Information: [phone]  Emotional Assessment Appearance:: Other (Comment Required(unable to assess - remote) Attitude/Demeanor/Rapport: Unable to Assess Affect (typically observed): Unable to Assess Orientation: : Oriented to Self, Oriented to Place,  Oriented to Situation, Oriented to  Time Alcohol / Substance Use: Not Applicable Psych  Involvement: No (comment)  Admission diagnosis:  COVID 19 Patient Active Problem List   Diagnosis Date Noted  . GERD (gastroesophageal reflux disease) 09/12/2018  . Hyperlipidemia 09/12/2018  . 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 Acute respiratory failure with hypoxia ([place]) 09/12/2018  . COVID-19 virus infection 09/12/2018  . Acute respiratory disease due to COVID-19 virus 09/12/2018  . Aspiration into airway   . Benign essential HTN   . Tobacco abuse   . Seizures ([place])   . Tachypnea   . Bradycardia   . Diastolic dysfunction   . Hypernatremia   . Malnutrition of moderate degree 09/07/2017  . Encounter for orogastric (OG) tube placement   . Cerebral edema ([place]) 09/05/2017  . Stroke (cerebrum) ([place]) 09/04/2017  . Acute ischemic stroke ([place]) 09/04/2017   PCP:  Patient, No Pcp Per Pharmacy:  No Pharmacies Listed    Social Determinants of Health (SDOH) Interventions    Readmission Risk Interventions No flowsheet data found.

## 2018-09-12 NOTE — Care Management Obs Status (Signed)
[place] NOTIFICATION   Patient Details  Name: MICHALE EMMERICH MRN: [id number] Date of Birth: [birth year]   Medicare Observation Status Notification Given:  Yes    Midge Minium RN, BSN, NCM-BC, ACM-RN [phone] 09/12/2018, 4:14 PM

## 2018-09-12 NOTE — NC FL2 (Signed)
[place] LEVEL OF CARE SCREENING TOOL     IDENTIFICATION  Patient Name: Dillon Garcia Birthdate: 1955-07-19 Sex: male Admission Date (Current Location): 09/11/2018  [facility] and [place] Number:  Herbalist and Address:  [facility] [place]. [facility], [place], [place] [address]([place])      Provider Number: [id number]  Attending Physician Name and Address:  Kathie Dike, MD  Relative Name and Phone Number:  [name] (brother) [phone]    Current Level of Care: Hospital Recommended Level of Care: [place] Prior Approval Number:    Date Approved/Denied: 03/17/11 PASRR Number: [id number]  Discharge Plan: SNF    Current Diagnoses: Patient Active Problem List   Diagnosis Date Noted  . GERD (gastroesophageal reflux disease) 09/12/2018  . Hyperlipidemia 09/12/2018  . 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 Acute respiratory failure with hypoxia ([place]) 09/12/2018  . COVID-19 virus infection 09/12/2018  . Acute respiratory disease due to COVID-19 virus 09/12/2018  . Aspiration into airway   . Benign essential HTN   . Tobacco abuse   . Seizures ([place])   . Tachypnea   . Bradycardia   . Diastolic dysfunction   . Hypernatremia   . Malnutrition of moderate degree 09/07/2017  . Encounter for orogastric (OG) tube placement   . Cerebral edema ([place]) 09/05/2017  . Stroke (cerebrum) ([place]) 09/04/2017  . Acute ischemic stroke ([place]) 09/04/2017    Orientation RESPIRATION BLADDER Height & Weight     Self, Time, Situation, Place  Normal Continent Weight:   Height:     BEHAVIORAL SYMPTOMS/MOOD NEUROLOGICAL BOWEL NUTRITION STATUS      Continent Diet(see discharge summary)  AMBULATORY STATUS COMMUNICATION OF NEEDS Skin   Limited Assist Verbally Normal                       Personal Care Assistance Level of Assistance  Bathing, Feeding, Dressing, Total care Bathing Assistance: Limited assistance Feeding  assistance: Limited assistance(needs set up but able to feed self) Dressing Assistance: Limited assistance Total Care Assistance: Limited assistance   Functional Limitations Info  Sight, Hearing, Speech Sight Info: Adequate Hearing Info: Adequate Speech Info: Adequate    SPECIAL CARE FACTORS FREQUENCY  PT (By licensed PT), OT (By licensed OT)     PT Frequency: min 5x weekly OT Frequency: min 5x weekly            Contractures Contractures Info: Not present    Additional Factors Info  Code Status, Allergies, Isolation Precautions Code Status Info: full Allergies Info: no known allergies     Isolation Precautions Info: air and contact precautions     Current Medications (09/12/2018):  This is the current hospital active medication list Current Facility-Administered Medications  Medication Dose Route Frequency Provider Last Rate Last Dose  . acetaminophen (TYLENOL) tablet 650 mg  650 mg Oral Q6H PRN Patrecia Pour, MD      . amLODipine (NORVASC) tablet 10 mg  10 mg Oral Daily Patrecia Pour, MD   10 mg at 09/12/18 1149  . aspirin EC tablet 325 mg  325 mg Oral Daily Patrecia Pour, MD   325 mg at 09/12/18 1149  . atorvastatin (LIPITOR) tablet 10 mg  10 mg Oral q1800 Vance Gather B, MD      . enoxaparin (LOVENOX) injection 40 mg  40 mg Subcutaneous Q24H Patrecia Pour, MD   40 mg at 09/12/18 1143  . famotidine (PEPCID)  tablet 20 mg  20 mg Oral Daily Patrecia Pour, MD   20 mg at 09/12/18 1147  . gabapentin (NEURONTIN) capsule 200 mg  200 mg Oral BID Vance Gather B, MD   200 mg at 09/12/18 1151  . levETIRAcetam (KEPPRA) tablet 500 mg  500 mg Oral BID Patrecia Pour, MD   500 mg at 09/12/18 1148  . ondansetron (ZOFRAN) tablet 4 mg  4 mg Oral Q6H PRN Patrecia Pour, MD       Or  . ondansetron (ZOFRAN) injection 4 mg  4 mg Intravenous Q6H PRN Patrecia Pour, MD      . Resource ThickenUp Clear   Oral PRN Kathie Dike, MD      . sodium chloride flush (NS) 0.9 % injection 3 mL  3 mL  Intravenous Q12H Patrecia Pour, MD   3 mL at 09/12/18 1145     Discharge Medications: Please see discharge summary for a list of discharge medications.  Relevant Imaging Results:  Relevant Lab Results:   Additional Information SSN: [id number]  Alberteen Sam, LCSW

## 2018-09-13 LAB — HEPATITIS B SURFACE ANTIGEN: Hepatitis B Surface Ag: NEGATIVE

## 2018-09-13 NOTE — Progress Notes (Signed)
Updated discharge summary sent to facility.   Dillon Garcia, [name]

## 2019-08-05 ENCOUNTER — Other Ambulatory Visit: Payer: Self-pay | Admitting: Adult Health Nurse Practitioner

## 2019-08-05 ENCOUNTER — Other Ambulatory Visit: Payer: Self-pay

## 2019-08-05 ENCOUNTER — Ambulatory Visit
Admission: RE | Admit: 2019-08-05 | Discharge: 2019-08-05 | Disposition: A | Discharge status: Home / Self Care | Payer: Medicare Other | Source: Ambulatory Visit | Attending: Adult Health Nurse Practitioner | Admitting: Adult Health Nurse Practitioner

## 2019-08-05 DIAGNOSIS — R918 Other nonspecific abnormal finding of lung field: Secondary | ICD-10-CM | POA: Diagnosis not present

## 2019-08-05 LAB — POCT I-STAT CREATININE: Creatinine, Ser: 0.9 mg/dL (ref 0.61–1.24)

## 2019-08-05 MED ORDER — IOHEXOL 300 MG/ML  SOLN
75.0000 mL | Freq: Once | INTRAMUSCULAR | Status: AC | PRN
  Administered 2019-08-05: 12:00:00 75 mL via INTRAVENOUS

## 2019-08-06 ENCOUNTER — Encounter: Payer: Self-pay | Admitting: *Deleted

## 2019-08-06 DIAGNOSIS — R918 Other nonspecific abnormal finding of lung field: Secondary | ICD-10-CM

## 2019-08-07 NOTE — Progress Notes (Signed)
  Oncology Nurse Navigator Documentation  Navigator Location: [facility]-Med Onc (08/07/19 0900) Referral Date to RadOnc/MedOnc: 08/06/19 (08/07/19 0900) )Navigator Encounter Type: Appt/Treatment Plan Review (08/07/19 0900)   Abnormal Finding Date: 08/05/19 (08/07/19 0900)                   Treatment Phase: Abnormal Scans (08/07/19 0900) Barriers/Navigation Needs: Coordination of Care (08/07/19 0900)   Interventions: Coordination of Care;Referrals (08/07/19 0900) Referrals: Pulmonary (08/07/19 0900) Coordination of Care: Appts;Radiology (08/07/19 0900)         Referral received from [facility] for lung mass found on recent imaging. Assistance provided to coordinate appts for consultation and PET scan. Staff at [facility] notified of upcoming appts by referral coordinator. Will follow up with patient and introduce to navigator services at new patient appt. Nothing further needed at this time.          Time Spent with Patient: 45 (08/07/19 0900)

## 2019-08-09 ENCOUNTER — Inpatient Hospital Stay: Payer: Medicare Other

## 2019-08-09 ENCOUNTER — Inpatient Hospital Stay: Payer: Medicare Other | Attending: Internal Medicine | Admitting: Internal Medicine

## 2019-08-09 ENCOUNTER — Encounter: Payer: Self-pay | Admitting: *Deleted

## 2019-08-09 ENCOUNTER — Other Ambulatory Visit: Payer: Self-pay

## 2019-08-09 ENCOUNTER — Encounter: Payer: Self-pay | Admitting: Internal Medicine

## 2019-08-09 DIAGNOSIS — R531 Weakness: Secondary | ICD-10-CM | POA: Diagnosis not present

## 2019-08-09 DIAGNOSIS — J439 Emphysema, unspecified: Secondary | ICD-10-CM | POA: Diagnosis not present

## 2019-08-09 DIAGNOSIS — Z8616 Personal history of COVID-19: Secondary | ICD-10-CM | POA: Insufficient documentation

## 2019-08-09 DIAGNOSIS — K219 Gastro-esophageal reflux disease without esophagitis: Secondary | ICD-10-CM | POA: Insufficient documentation

## 2019-08-09 DIAGNOSIS — R918 Other nonspecific abnormal finding of lung field: Secondary | ICD-10-CM | POA: Insufficient documentation

## 2019-08-09 DIAGNOSIS — Z8673 Personal history of transient ischemic attack (TIA), and cerebral infarction without residual deficits: Secondary | ICD-10-CM | POA: Insufficient documentation

## 2019-08-09 DIAGNOSIS — I10 Essential (primary) hypertension: Secondary | ICD-10-CM | POA: Diagnosis not present

## 2019-08-09 DIAGNOSIS — E785 Hyperlipidemia, unspecified: Secondary | ICD-10-CM | POA: Insufficient documentation

## 2019-08-09 DIAGNOSIS — Z79899 Other long term (current) drug therapy: Secondary | ICD-10-CM | POA: Diagnosis not present

## 2019-08-09 DIAGNOSIS — E039 Hypothyroidism, unspecified: Secondary | ICD-10-CM | POA: Insufficient documentation

## 2019-08-09 DIAGNOSIS — Z7982 Long term (current) use of aspirin: Secondary | ICD-10-CM | POA: Diagnosis not present

## 2019-08-09 DIAGNOSIS — I700 Atherosclerosis of aorta: Secondary | ICD-10-CM | POA: Insufficient documentation

## 2019-08-09 DIAGNOSIS — Z87891 Personal history of nicotine dependence: Secondary | ICD-10-CM | POA: Insufficient documentation

## 2019-08-09 DIAGNOSIS — R770 Abnormality of albumin: Secondary | ICD-10-CM | POA: Diagnosis not present

## 2019-08-09 DIAGNOSIS — I059 Rheumatic mitral valve disease, unspecified: Secondary | ICD-10-CM | POA: Insufficient documentation

## 2019-08-09 LAB — COMPREHENSIVE METABOLIC PANEL
ALT: 29 U/L (ref 0–44)
AST: 23 U/L (ref 15–41)
Albumin: 3.2 g/dL — ABNORMAL LOW (ref 3.5–5.0)
Alkaline Phosphatase: 80 U/L (ref 38–126)
Anion gap: 9 (ref 5–15)
BUN: 9 mg/dL (ref 8–23)
CO2: 27 mmol/L (ref 22–32)
Calcium: 9 mg/dL (ref 8.9–10.3)
Chloride: 105 mmol/L (ref 98–111)
Creatinine, Ser: 0.88 mg/dL (ref 0.61–1.24)
GFR calc Af Amer: >60 mL/min (ref >60)
GFR calc non Af Amer: >60 mL/min (ref >60)
Glucose, Bld: 103 mg/dL — ABNORMAL HIGH (ref 70–99)
Potassium: 3.6 mmol/L (ref 3.5–5.1)
Sodium: 141 mmol/L (ref 135–145)
Total Bilirubin: 0.3 mg/dL (ref 0.3–1.2)
Total Protein: 7.3 g/dL (ref 6.5–8.1)

## 2019-08-09 LAB — CBC WITH DIFFERENTIAL/PLATELET
Abs Immature Granulocytes: 0.03 10*3/uL (ref 0.00–0.07)
Basophils Absolute: 0.1 10*3/uL (ref 0.0–0.1)
Basophils Relative: 1 %
Eosinophils Absolute: 0.3 10*3/uL (ref 0.0–0.5)
Eosinophils Relative: 3 %
HCT: 42.9 % (ref 39.0–52.0)
Hemoglobin: 13.9 g/dL (ref 13.0–17.0)
Immature Granulocytes: 0 %
Lymphocytes Relative: 23 %
Lymphs Abs: 2.1 10*3/uL (ref 0.7–4.0)
MCH: 27 pg (ref 26.0–34.0)
MCHC: 32.4 g/dL (ref 30.0–36.0)
MCV: 83.3 fL (ref 80.0–100.0)
Monocytes Absolute: 0.9 10*3/uL (ref 0.1–1.0)
Monocytes Relative: 10 %
Neutro Abs: 5.6 10*3/uL (ref 1.7–7.7)
Neutrophils Relative %: 63 %
Platelets: 268 10*3/uL (ref 150–400)
RBC: 5.15 MIL/uL (ref 4.22–5.81)
RDW: 13.7 % (ref 11.5–15.5)
Smear Review: ADEQUATE
WBC: 8.9 10*3/uL (ref 4.0–10.5)
nRBC: 0 % (ref 0.0–0.2)

## 2019-08-09 LAB — LACTATE DEHYDROGENASE: LDH: 197 U/L — ABNORMAL HIGH (ref 98–192)

## 2019-08-09 NOTE — Progress Notes (Signed)
  Oncology Nurse Navigator Documentation  Navigator Location: [facility]Med [facility]c (08/09/19 1000)   )Navigator Encounter Type: Clinic/MDC (08/09/19 1000)               Multidisiplinary Clinic Date: 08/09/19 (08/09/19 1000) Multidisiplinary Clinic Type: Thoracic (08/09/19 1000)   Patient Visit Type: MedOnc (08/09/19 1000)   Barriers/Navigation Needs: Coordination of Care;Morbidities/Frailty (08/09/19 1000)   Interventions: Coordination of Care;Referrals (08/09/19 1000) Referrals: Pulmonary (08/09/19 1000) Coordination of Care: Appts;Radiology (08/09/19 1000)        Acuity: Level 2-Minimal Needs (1-2 Barriers Identified) (08/09/19 1000)    met with patient during initial consult with Dr. Rogue Bussing to review recent CT scan and discuss next steps. All questions answered during visit. Reviewed upcoming appts with patient and given printout to take back to facility. Phone call made to patient's brother, [name], to update on pt and treatment plan. All questions answered during phone call with [facility] and reviewed upcoming appts with him as well. Encouraged pt's brother to attend future appts if able. Contact info given and instructed to call back with any further questions or needs. Understanding verbalized. Nothing further needed at this time.      Time Spent with Patient: 60 (08/09/19 1000)

## 2019-08-09 NOTE — Assessment & Plan Note (Addendum)
#  Left lung mass 4 to 5 cm in size; upper 12 mm mediastinal lymph node-all highly concerning for malignancy.  Await PET scan.  Awaiting pulmonary evaluation.  #Discussed with patient the above concerns for malignancy unless otherwise proven.  Discussed the need for above evaluation-patient agreement.  Spoke to patient regarding the possible need for chemotherapy radiation to treat his suspected malignancy.  Patient seems to be willing to go with treatments if and when needed.  # hx of stroke/ right sided weakness chronic; will need MRI of the brain down the line for further staging purposes.   Thank you, for allowing me to participate in the care of your pleasant patient. Please do not hesitate to contact me with questions or concerns in the interim.  # I reviewed the blood work- with the patient in detail; also reviewed the imaging independently [as summarized above]; and with the patient in detail.  Contacted patient's brother Kore Madlock x2-phone call dropped.  Haley-we will reach out to the patient's brother to discuss the plan.  # DISPOSITION:  # labs- cbc/cmp/ldh/cea # follow up TBD-Dr.B

## 2019-08-09 NOTE — Progress Notes (Signed)
[place] CONSULT NOTE  Patient Care Team: Patient, No Pcp Per as PCP - General ([place]) [name], RN as Oncology Nurse Navigator  CHIEF COMPLAINTS/PURPOSE OF CONSULTATION: LEFT LUNG MASS  #April 2021-left lung mass approximately 4-5cm; 12 mediastinal lymph node [initially noted May 2020-incidental/hospital]  #Right sided stroke/weakness/ [wheelchair-bound/[facility] resident]; dysarthria; Hx of MVR    Oncology History   No history exists.     HISTORY OF PRESENTING ILLNESS: Patient is minimally verbal because of dysarthria. Dillon Garcia 64 y.o.  male history of smoking is here for further evaluation and recommendations for lung mass/nodule.  Patient medical history is complicated by history of stroke with significant residual dysarthria; currently lives in [facility]; and nonambulatory at baseline/wheelchair for his daily activities.  May 2020-patient was admitted to hospital for chest pain-work-up showed a CT scan left upper lung nodule.  Approximately 1.8 cm in size.   However more recently patient had repeat CT scan that showed enlarged left upper lobe mass/noted above.    Review of Systems  Unable to perform ROS: Patient nonverbal     MEDICAL HISTORY:  Past Medical History:  Diagnosis Date  . GERD (gastroesophageal reflux disease)   . Heart disease   . HTN (hypertension)   . Hyperlipidemia   . Hypothyroid   . Infectious endocarditis 2012  . Mitral valve disease 2012    SURGICAL HISTORY: Past Surgical History:  Procedure Laterality Date  . MEDIAN STERNOTOMY     CABG? valve replacement?  Marland Kitchen MITRAL VALVE REPLACEMENT  2012    SOCIAL HISTORY: Social History   Socioeconomic History  . Marital status: Single    Spouse name: Not on file  . Number of children: Not on file  . Years of education: Not on file  . Highest education level: Not on file  Occupational History  . Not on file  Tobacco Use  . Smoking status:  Former Research scientist (life sciences)  . Smokeless tobacco: Current User  Substance and Sexual Activity  . Alcohol use: Not Currently  . Drug use: Not Currently  . Sexual activity: Not on file  Other Topics Concern  . Not on file  Social History Narrative   White [employer]; used to work in [employer]; ; never married; used to smoke; no alochol.    Social Determinants of Health   Financial Resource Strain:   . Difficulty of Paying Living Expenses:   Food Insecurity:   . Worried About Charity fundraiser in the Last Year:   . Arboriculturist in the Last Year:   Transportation Needs:   . Film/video editor (Medical):   Marland Kitchen Lack of Transportation (Non-Medical):   Physical Activity:   . Days of Exercise per Week:   . Minutes of Exercise per Session:   Stress:   . Feeling of Stress :   Social Connections:   . Frequency of Communication with Friends and Family:   . Frequency of Social Gatherings with Friends and Family:   . Attends Religious Services:   . Active Member of Clubs or Organizations:   . Attends Archivist Meetings:   Marland Kitchen Marital Status:   Intimate Partner Violence:   . Fear of Current or Ex-Partner:   . Emotionally Abused:   Marland Kitchen Physically Abused:   . Sexually Abused:     FAMILY HISTORY: No family history on file.  ALLERGIES:  has No Known Allergies.  MEDICATIONS:  Current Outpatient Medications  Medication  Sig Dispense Refill  . acetaminophen (TYLENOL) 325 MG tablet Take 2 tablets (650 mg total) by mouth every 4 (four) hours as needed for mild pain (or temp > 37.5 C (99.5 F)).    Marland Kitchen amLODipine (NORVASC) 10 MG tablet Take 10 mg by mouth daily.     Marland Kitchen ammonium lactate (LAC-HYDRIN) 12 % lotion Apply 1 application topically daily.    Marland Kitchen aspirin 325 MG tablet Take 1 tablet (325 mg total) by mouth daily. 30 tablet 11  . atorvastatin (LIPITOR) 10 MG tablet Take 1 tablet (10 mg total) by mouth daily at 6 PM. 30 tablet 6  . famotidine (PEPCID) 20 MG tablet Take 1 tablet (20 mg  total) by mouth 2 (two) times daily. (Patient taking differently: Take 20 mg by mouth daily. ) 60 tablet 6  . gabapentin (NEURONTIN) 100 MG capsule Take 200 mg by mouth 2 (two) times daily.    Marland Kitchen levETIRAcetam (KEPPRA) 500 MG tablet Take 500 mg by mouth 2 (two) times daily.    . Maltodextrin-Xanthan Gum (RESOURCE THICKENUP CLEAR) POWD Thicken liquids to honey thick texture 1 Can 6  . Multiple Vitamin (MULTIVITAMIN WITH MINERALS) TABS tablet Take 1 tablet by mouth daily.    . multivitamin (PROSIGHT) TABS tablet Take 1 tablet by mouth daily. 30 each 0  . senna-docusate (SENOKOT-S) 8.6-50 MG tablet Take 1 tablet by mouth at bedtime as needed for mild constipation. 30 tablet 2  . citalopram (CELEXA) 10 MG tablet Take 10 mg by mouth daily.     No current facility-administered medications for this visit.      Marland Kitchen  PHYSICAL EXAMINATION: ECOG PERFORMANCE STATUS: 0 - Asymptomatic  Vitals:   08/09/19 0916  BP: 113/79  Pulse: 88  Temp: (!) 97.4 F (36.3 C)   Filed Weights    Physical Exam  Constitutional: He is oriented to person, place, and time and well-developed, well-nourished, and in no distress.  Patient is wheelchair-bound.  Alone.  HENT:  Head: Normocephalic and atraumatic.  Mouth/Throat: Oropharynx is clear and moist. No oropharyngeal exudate.  Eyes: Pupils are equal, round, and reactive to light.  Cardiovascular: Normal rate and regular rhythm.  Pulmonary/Chest: No respiratory distress. He has no wheezes.  Abdominal: Soft. Bowel sounds are normal. He exhibits no distension and no mass. There is no abdominal tenderness. There is no rebound and no guarding.  Musculoskeletal:        General: No tenderness or edema. Normal range of motion.     Cervical back: Normal range of motion and neck supple.  Neurological: He is alert and oriented to person, place, and time.  Chronic weakness of the right upper lower extremity.  Skin: Skin is warm.  Psychiatric: Affect normal.      LABORATORY DATA:  I have reviewed the data as listed Lab Results  Component Value Date   WBC 8.9 08/09/2019   HGB 13.9 08/09/2019   HCT 42.9 08/09/2019   MCV 83.3 08/09/2019   PLT 268 08/09/2019   Recent Labs    09/11/18 1303 09/11/18 1303 09/12/18 0340 08/05/19 1131 08/09/19 1002  NA 139  --  141  --  141  K 4.0  --  3.9  --  3.6  CL 106  --  106  --  105  CO2 27  --  28  --  27  GLUCOSE 91  --  81  --  103*  BUN 11  --  15  --  9  CREATININE 0.84   < >  0.83 0.90 0.88  CALCIUM 8.8*  --  9.2  --  9.0  GFRNONAA >60  --  >60  --  >60  GFRAA >60  --  >60  --  >60  PROT 6.4*  --  6.5  --  7.3  ALBUMIN 3.4*  --  3.3*  --  3.2*  AST 39  --  43*  --  23  ALT 40  --  49*  --  29  ALKPHOS 79  --  79  --  80  BILITOT 0.5  --  0.8  --  0.3   < > = values in this interval not displayed.    RADIOGRAPHIC STUDIES: I have personally reviewed the radiological images as listed and agreed with the findings in the report. CT CHEST W CONTRAST  Addendum Date: 08/05/2019   ADDENDUM REPORT: 08/05/2019 12:16 ADDENDUM: These results will be called to the ordering clinician or representative by the Radiologist Assistant, and communication documented in the PACS or [employer]. Electronically Signed   By: Lorin Picket M.D.   On: 08/05/2019 12:16   Result Date: 08/05/2019 CLINICAL DATA:  History of COVID infection.  Lung mass. EXAM: CT CHEST WITH CONTRAST TECHNIQUE: Multidetector CT imaging of the chest was performed during intravenous contrast administration. CONTRAST:  57mL OMNIPAQUE IOHEXOL 300 MG/ML  SOLN COMPARISON:  09/12/2018. FINDINGS: Cardiovascular: Atherosclerotic calcification of the aorta. Heart size normal. No pericardial effusion. Mediastinum/Nodes: Mediastinal lymph nodes measure up to 12 mm anterior to the carina, unchanged. There is left hilar lymphoid tissue without a discrete measurable lymph node. No right hilar or axillary adenopathy. Esophagus is unremarkable.  Lungs/Pleura: Centrilobular emphysema. Image quality in the mid and lower lung zones is degraded by respiratory motion. Lingular mass measures 4.1 x 5.0 cm and exerts mass effect on the adjacent left major fissure. Associated obstruction of the lingular bronchus. Minimal subpleural scarring in the lateral left lower lobe. No pleural fluid. Airway is otherwise unremarkable. Upper Abdomen: Visualized portions of the liver and gallbladder are unremarkable. 1.8 cm hyperdense nodule off the medial limb right adrenal gland, stable in size from 09/12/2018. Visualized portion of the left adrenal gland is unremarkable. Partially imaged low-attenuation lesion in the left suprarenal fossa measures 5.4 cm, similar, likely originating from the left kidney. Visualized portions of the spleen, pancreas, stomach and bowel are otherwise grossly unremarkable. Gastrohepatic ligament lymph nodes are not enlarged by CT size criteria. Musculoskeletal: Degenerative changes in the spine. Old rib fractures. No worrisome lytic or sclerotic lesions. IMPRESSION: 1. Lingular mass is most indicative of primary bronchogenic carcinoma. 2. Right adrenal nodule is stable from 09/12/2018, favoring an adenoma. Definitive diagnosis on today's study is not possible without precontrast or delayed postcontrast imaging. 3.  Aortic atherosclerosis (ICD10-I70.0). 4.  Emphysema (ICD10-J43.9). Electronically Signed: By: Lorin Picket M.D. On: 08/05/2019 11:58    ASSESSMENT & PLAN:   Mass of upper lobe of left lung #Left lung mass 4 to 5 cm in size; upper 12 mm mediastinal lymph node-all highly concerning for malignancy.  Await PET scan.  Awaiting pulmonary evaluation.  #Discussed with patient the above concerns for malignancy unless otherwise proven.  Discussed the need for above evaluation-patient agreement.  Spoke to patient regarding the possible need for chemotherapy radiation to treat his suspected malignancy.  Patient seems to be willing to go  with treatments if and when needed.  # hx of stroke/ right sided weakness chronic; will need MRI of the brain down the line for further  staging purposes.   Thank you, for allowing me to participate in the care of your pleasant patient. Please do not hesitate to contact me with questions or concerns in the interim.  # I reviewed the blood work- with the patient in detail; also reviewed the imaging independently [as summarized above]; and with the patient in detail.  Contacted patient's brother Hamid Brookens x2-phone call dropped.  Haley-we will reach out to the patient's brother to discuss the plan.  # DISPOSITION:  # labs- cbc/cmp/ldh/cea # follow up TBD-Dr.B  All questions were answered. The patient knows to call the clinic with any problems, questions or concerns.    [name], MD 08/09/2019 11:51 AM

## 2019-08-10 LAB — CEA: CEA: 12.9 ng/mL — ABNORMAL HIGH (ref 0.0–4.7)

## 2019-08-14 ENCOUNTER — Encounter
Admission: RE | Admit: 2019-08-14 | Discharge: 2019-08-14 | Disposition: A | Discharge status: Home / Self Care | Payer: Medicare Other | Source: Ambulatory Visit | Attending: Internal Medicine | Admitting: Internal Medicine

## 2019-08-14 ENCOUNTER — Other Ambulatory Visit: Payer: Self-pay

## 2019-08-14 DIAGNOSIS — N200 Calculus of kidney: Secondary | ICD-10-CM | POA: Insufficient documentation

## 2019-08-14 DIAGNOSIS — I700 Atherosclerosis of aorta: Secondary | ICD-10-CM | POA: Diagnosis not present

## 2019-08-14 DIAGNOSIS — E279 Disorder of adrenal gland, unspecified: Secondary | ICD-10-CM | POA: Diagnosis not present

## 2019-08-14 DIAGNOSIS — I2510 Atherosclerotic heart disease of native coronary artery without angina pectoris: Secondary | ICD-10-CM | POA: Diagnosis not present

## 2019-08-14 DIAGNOSIS — R918 Other nonspecific abnormal finding of lung field: Secondary | ICD-10-CM | POA: Insufficient documentation

## 2019-08-14 LAB — GLUCOSE, CAPILLARY: Glucose-Capillary: 87 mg/dL (ref 70–99)

## 2019-08-14 MED ORDER — FLUDEOXYGLUCOSE F - 18 (FDG) INJECTION
7.3000 | Freq: Once | INTRAVENOUS | Status: AC | PRN
  Administered 2019-08-14: 11:00:00 7.91 via INTRAVENOUS

## 2019-08-15 ENCOUNTER — Encounter: Payer: Self-pay | Admitting: *Deleted

## 2019-08-15 NOTE — Progress Notes (Signed)
  Oncology Nurse Navigator Documentation  Navigator Location: [facility] (08/15/19 1400)   )Navigator Encounter Type: Telephone (08/15/19 1400) Telephone: Diagnostic Results;Outgoing Call;Patient Update (08/15/19 1400)                       Barriers/Navigation Needs: Coordination of Care (08/15/19 1400)   Interventions: Coordination of Care;Referrals (08/15/19 1400) Referrals: Radiation Oncology (08/15/19 1400) Coordination of Care: Appts (08/15/19 1400)         phone call made to pt's brother to inform of PET scan results after discussing with Dr. Patsey Berthold. All questions answered during phone call. Reviewed upcoming appts. Encouraged brother to attend if able. [facility] has been made aware of upcoming appts as well to provide transportation. Nothing further needed at this time.         Time Spent with Patient: 45 (08/15/19 1400)

## 2019-08-19 ENCOUNTER — Institutional Professional Consult (permissible substitution): Payer: Medicare Other | Admitting: Pulmonary Disease

## 2019-08-21 ENCOUNTER — Other Ambulatory Visit: Payer: Self-pay

## 2019-08-21 ENCOUNTER — Ambulatory Visit: Payer: Medicare Other | Admitting: Pulmonary Disease

## 2019-08-21 ENCOUNTER — Encounter: Payer: Self-pay | Admitting: Pulmonary Disease

## 2019-08-21 VITALS — BP 118/74 | HR 83 | Temp 97.3°F | Ht 68.0 in | Wt 167.4 lb

## 2019-08-21 DIAGNOSIS — R918 Other nonspecific abnormal finding of lung field: Secondary | ICD-10-CM

## 2019-08-21 DIAGNOSIS — I69359 Hemiplegia and hemiparesis following cerebral infarction affecting unspecified side: Secondary | ICD-10-CM

## 2019-08-21 NOTE — Patient Instructions (Signed)
Discussed the findings on your lung.  These are likely related to cancer.  You are a very high risk for biopsy due to potential for having another stroke during general anesthesia.  This would be devastating to you.  You will be evaluated by radiation oncology (Dr. Baruch Gouty) for therapy without biopsy which is commonly done for patients that are high risk.   We will see you back as needed.  Thank you for trusting [place] with your care.

## 2019-08-21 NOTE — Progress Notes (Signed)
    Assessment & Plan:  1. Mass of upper lobe of left lung (Primary)  2. Hemiplegia, dominant side S/P CVA (cerebrovascular accident) [facility])   Patient Instructions  Discussed the findings on your lung.  These are likely related to cancer.  You are a very high risk for biopsy due to potential for having another stroke during general anesthesia.  This would be devastating to you.  You will be evaluated by radiation oncology (Dr. Lenn) for therapy without biopsy which is commonly done for patients that are high risk.   We will see you back as needed.  Thank you for trusting us  with your care.  Please note: late entry documentation due to logistical difficulties during COVID-19 pandemic. This note is filed for information purposes only, and is not intended to be used for billing, nor does it represent the full scope/nature of the visit in question. Please see any associated scanned media linked to date of encounter for additional pertinent information.  Subjective:    HPI: Dillon Garcia is a 64 y.o. male presenting to the pulmonology clinic on 08/21/2019 with report of: Consult (Patient had PET scan done recently and was sent here for mass on scan. )     Outpatient Encounter Medications as of 08/21/2019  Medication Sig Note   amLODipine  (NORVASC ) 10 MG tablet Take 10 mg by mouth daily.    [EXPIRED] ammonium lactate (LAC-HYDRIN) 12 % lotion Apply 1 application topically daily. 09/11/2018: Apply to feet   citalopram (CELEXA) 10 MG tablet Take 10 mg by mouth daily.    gabapentin  (NEURONTIN ) 100 MG capsule Take 200 mg by mouth 2 (two) times daily.    levETIRAcetam  (KEPPRA ) 500 MG tablet Take 500 mg by mouth 2 (two) times daily.    [DISCONTINUED] acetaminophen  (TYLENOL ) 325 MG tablet Take 2 tablets (650 mg total) by mouth every 4 (four) hours as needed for mild pain (or temp > 37.5 C (99.5 F)). (Patient not taking: No sig reported)    [DISCONTINUED] aspirin  325 MG tablet Take 1 tablet (325  mg total) by mouth daily.    [DISCONTINUED] atorvastatin  (LIPITOR) 10 MG tablet Take 1 tablet (10 mg total) by mouth daily at 6 PM.    [DISCONTINUED] famotidine  (PEPCID ) 20 MG tablet Take 1 tablet (20 mg total) by mouth 2 (two) times daily.    [DISCONTINUED] Maltodextrin-Xanthan Gum (RESOURCE THICKENUP CLEAR) POWD Thicken liquids to honey thick texture    [DISCONTINUED] Multiple Vitamin (MULTIVITAMIN WITH MINERALS) TABS tablet Take 1 tablet by mouth daily.    [DISCONTINUED] multivitamin (PROSIGHT) TABS tablet Take 1 tablet by mouth daily.    [DISCONTINUED] senna-docusate (SENOKOT-S) 8.6-50 MG tablet Take 1 tablet by mouth at bedtime as needed for mild constipation.    No facility-administered encounter medications on file as of 08/21/2019.      Objective:   Vitals:   08/21/19 1342  BP: 118/74  Pulse: 83  Temp: (!) 97.3 F (36.3 C)  Height: 5' 8 (1.727 m)  Weight: 167 lb 6.4 oz (75.9 kg)  SpO2: 93%  TempSrc: Temporal  BMI (Calculated): 25.46     Physical exam documentation is limited by delayed entry of information.

## 2019-08-23 ENCOUNTER — Institutional Professional Consult (permissible substitution): Payer: Medicare Other | Admitting: Internal Medicine

## 2019-08-28 ENCOUNTER — Other Ambulatory Visit: Payer: Self-pay

## 2019-08-28 ENCOUNTER — Ambulatory Visit
Admission: RE | Admit: 2019-08-28 | Discharge: 2019-08-28 | Disposition: A | Discharge status: Home / Self Care | Payer: Medicare Other | Source: Ambulatory Visit | Attending: Radiation Oncology | Admitting: Radiation Oncology

## 2019-08-28 ENCOUNTER — Encounter: Payer: Self-pay | Admitting: Internal Medicine

## 2019-08-28 ENCOUNTER — Encounter: Payer: Self-pay | Admitting: *Deleted

## 2019-08-28 ENCOUNTER — Inpatient Hospital Stay: Payer: Medicare Other | Attending: Internal Medicine | Admitting: Internal Medicine

## 2019-08-28 DIAGNOSIS — I10 Essential (primary) hypertension: Secondary | ICD-10-CM | POA: Insufficient documentation

## 2019-08-28 DIAGNOSIS — C3412 Malignant neoplasm of upper lobe, left bronchus or lung: Secondary | ICD-10-CM

## 2019-08-28 DIAGNOSIS — E039 Hypothyroidism, unspecified: Secondary | ICD-10-CM | POA: Insufficient documentation

## 2019-08-28 DIAGNOSIS — Z79899 Other long term (current) drug therapy: Secondary | ICD-10-CM | POA: Insufficient documentation

## 2019-08-28 DIAGNOSIS — R918 Other nonspecific abnormal finding of lung field: Secondary | ICD-10-CM

## 2019-08-28 DIAGNOSIS — C3411 Malignant neoplasm of upper lobe, right bronchus or lung: Secondary | ICD-10-CM | POA: Insufficient documentation

## 2019-08-28 DIAGNOSIS — Z7189 Other specified counseling: Secondary | ICD-10-CM

## 2019-08-28 DIAGNOSIS — R471 Dysarthria and anarthria: Secondary | ICD-10-CM | POA: Insufficient documentation

## 2019-08-28 DIAGNOSIS — R590 Localized enlarged lymph nodes: Secondary | ICD-10-CM | POA: Insufficient documentation

## 2019-08-28 DIAGNOSIS — R531 Weakness: Secondary | ICD-10-CM | POA: Insufficient documentation

## 2019-08-28 DIAGNOSIS — I059 Rheumatic mitral valve disease, unspecified: Secondary | ICD-10-CM | POA: Insufficient documentation

## 2019-08-28 DIAGNOSIS — Z7982 Long term (current) use of aspirin: Secondary | ICD-10-CM | POA: Insufficient documentation

## 2019-08-28 DIAGNOSIS — E785 Hyperlipidemia, unspecified: Secondary | ICD-10-CM | POA: Diagnosis not present

## 2019-08-28 DIAGNOSIS — K219 Gastro-esophageal reflux disease without esophagitis: Secondary | ICD-10-CM | POA: Diagnosis not present

## 2019-08-28 DIAGNOSIS — I38 Endocarditis, valve unspecified: Secondary | ICD-10-CM | POA: Diagnosis not present

## 2019-08-28 DIAGNOSIS — Z993 Dependence on wheelchair: Secondary | ICD-10-CM | POA: Insufficient documentation

## 2019-08-28 DIAGNOSIS — Z87891 Personal history of nicotine dependence: Secondary | ICD-10-CM | POA: Insufficient documentation

## 2019-08-28 DIAGNOSIS — I700 Atherosclerosis of aorta: Secondary | ICD-10-CM | POA: Insufficient documentation

## 2019-08-28 DIAGNOSIS — I341 Nonrheumatic mitral (valve) prolapse: Secondary | ICD-10-CM | POA: Diagnosis not present

## 2019-08-28 DIAGNOSIS — Z8673 Personal history of transient ischemic attack (TIA), and cerebral infarction without residual deficits: Secondary | ICD-10-CM | POA: Insufficient documentation

## 2019-08-28 DIAGNOSIS — J439 Emphysema, unspecified: Secondary | ICD-10-CM | POA: Diagnosis not present

## 2019-08-28 NOTE — Consult Note (Signed)
NEW PATIENT EVALUATION  Name: Dillon Garcia  MRN: [id number]  Date:   08/28/2019     DOB: [birth year]   This 64 y.o. 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  REFERRING PHYSICIAN: Alvester Morin*  CHIEF COMPLAINT:  Chief Complaint  Patient presents with  . Lung Cancer    Initial consultation    DIAGNOSIS: The encounter diagnosis was Mass of upper lobe of left lung.   PREVIOUS INVESTIGATIONS:  Serial CT scans reviewed Clinical notes reviewed  HPI: Patient is a 64 year old male with significant right-sided weakness secondary to CVA currently resident of [facility].  He presented approximately a year ago with a indeterminate solid posterior left upper lobe mass measuring 1.8 cm which cannot be excluded bronchogenic carcinoma.  He also had indeterminate right adrenal nodule at the time.  He was recently seen with a repeat CT scan of the chest back in April with left lung mass now measuring 4.1 x 5 cm a lingular mass consistent with progressive primary bronchogenic carcinoma.  His adrenal lesion was stable.  He underwent a PET CT scan showing hypermetabolic lingular mass with minimal ipsilateral mediastinal adenopathy with minimal hypermetabolic activity.  The right adrenal nodule showed mild hypermetabolic activity with and stable from prior examination 1 year prior reassuring for benign lesion.  He was seen by pulmonology and thought to be too high risk based on his medical comorbidities for general anesthesia and bronchoscopy.  He has been seen by medical oncology who based on the fact we cannot biopsy this lesion has not recommended systemic chemotherapy.  He is seen today for radiation oncology opinion he is doing fairly well again obvious evidence of right-sided weakness wheelchair-bound.  He specifically denies cough  hemoptysis or chest tightness.  PLANNED TREATMENT REGIMEN: SBRT  PAST MEDICAL HISTORY:  has a past medical history of GERD (gastroesophageal reflux disease), Heart disease, HTN (hypertension), Hyperlipidemia, Hypothyroid, Infectious endocarditis (2012), and Mitral valve disease (2012).    PAST SURGICAL HISTORY:  Past Surgical History:  Procedure Laterality Date  . MEDIAN STERNOTOMY     CABG? valve replacement?  Marland Kitchen MITRAL VALVE REPLACEMENT  2012    FAMILY HISTORY: family history is not on file.  SOCIAL HISTORY:  reports that he quit smoking about a year ago. He has never used smokeless tobacco. He reports previous alcohol use. He reports previous drug use.  ALLERGIES: Patient has no known allergies.  MEDICATIONS:  Current Outpatient Medications  Medication Sig Dispense Refill  . acetaminophen (TYLENOL) 325 MG tablet Take 2 tablets (650 mg total) by mouth every 4 (four) hours as needed for mild pain (or temp > 37.5 C (99.5 F)).    Marland Kitchen amLODipine (NORVASC) 10 MG tablet Take 10 mg by mouth daily.     Marland Kitchen ammonium lactate (LAC-HYDRIN) 12 % lotion Apply 1 application topically daily.    Marland Kitchen aspirin 325 MG tablet Take 1 tablet (325 mg total) by mouth daily. 30 tablet 11  . atorvastatin (LIPITOR) 10 MG tablet Take 1 tablet (10 mg total) by mouth daily at 6 PM. 30 tablet 6  . citalopram (CELEXA) 10 MG tablet Take 10 mg by mouth daily.    . famotidine (PEPCID) 20 MG tablet Take 1 tablet (20 mg total) by mouth 2 (two) times daily. (Patient taking differently: Take 20 mg by mouth daily. ) 60 tablet  6  . gabapentin (NEURONTIN) 100 MG capsule Take 200 mg by mouth 2 (two) times daily.    Marland Kitchen levETIRAcetam (KEPPRA) 500 MG tablet Take 500 mg by mouth 2 (two) times daily.    . Maltodextrin-Xanthan Gum (RESOURCE THICKENUP CLEAR) POWD Thicken liquids to honey thick texture 1 Can 6  . Multiple Vitamin (MULTIVITAMIN WITH MINERALS) TABS tablet Take 1 tablet by mouth daily.    . multivitamin (PROSIGHT) TABS tablet  Take 1 tablet by mouth daily. 30 each 0  . senna-docusate (SENOKOT-S) 8.6-50 MG tablet Take 1 tablet by mouth at bedtime as needed for mild constipation. 30 tablet 2   No current facility-administered medications for this encounter.    ECOG PERFORMANCE STATUS:  0 - Asymptomatic  REVIEW OF SYSTEMS: Except for his right-sided weakness secondary to CVA Patient denies any weight loss, fatigue, weakness, fever, chills or night sweats. Patient denies any loss of vision, blurred vision. Patient denies any ringing  of the ears or hearing loss. No irregular heartbeat. Patient denies heart murmur or history of fainting. Patient denies any chest pain or pain radiating to her upper extremities. Patient denies any shortness of breath, difficulty breathing at night, cough or hemoptysis. Patient denies any swelling in the lower legs. Patient denies any nausea vomiting, vomiting of blood, or coffee ground material in the vomitus. Patient denies any stomach pain. Patient states has had normal bowel movements no significant constipation or diarrhea. Patient denies any dysuria, hematuria or significant nocturia. Patient denies any problems walking, swelling in the joints or loss of balance. Patient denies any skin changes, loss of hair or loss of weight. Patient denies any excessive worrying or anxiety or significant depression. Patient denies any problems with insomnia. Patient denies excessive thirst, polyuria, polydipsia. Patient denies any swollen glands, patient denies easy bruising or easy bleeding. Patient denies any recent infections, allergies or URI. Patient "s visual fields have not changed significantly in recent time.   PHYSICAL EXAM: There were no vitals taken for this visit. Wheelchair-bound male in NAD.  Obvious right-sided weakness.  Well-developed well-nourished patient in NAD. HEENT reveals PERLA, EOMI, discs not visualized.  Oral cavity is clear. No oral mucosal lesions are identified. Neck is clear  without evidence of cervical or supraclavicular adenopathy. Lungs are clear to A&P. Cardiac examination is essentially unremarkable with regular rate and rhythm without murmur rub or thrill. Abdomen is benign with no organomegaly or masses noted. Motor sensory and DTR levels are equal and symmetric in the upper and lower extremities. Cranial nerves II through XII are grossly intact. Proprioception is intact. No peripheral adenopathy or edema is identified. No motor or sensory levels are noted. Crude visual fields are within normal range.  LABORATORY DATA: Labs are reviewed    RADIOLOGY RESULTS: CT scans and PET CT scans all reviewed compatible with above-stated findings   IMPRESSION: Probable stage Ib non-small cell lung cancer left upper lobe in 64 year old male with significant right-sided weakness secondary to CVA.  PLAN: At this time based on opinion of pulmonology medical oncology and myself as well as radiology this is a progressive hypermetabolic non-small cell lung cancer.  Because we cannot biopsy this lesion I have proposed going ahead with SBRT to the lesion.  I am not convinced his mediastinal nodes are positive based on the small size and minimal hypermetabolic activity.  I would plan on delivering 6000 cGy in 5 fractions.  I be believe this is the best treatment plan for this patient with limited mobility  and comorbidities.  Risks and benefits of treatment including development of a cough possible fatigue possible radiation esophagitis and skin reaction all were discussed with the patient and his brother.  Wound use 4-dimensional and motion restriction during his simulation.  I have personally set up CT simulation.  Patient comprehends my treatment plan well.  I would like to take this opportunity to thank you for allowing me to participate in the care of your patient.Noreene Filbert, MD

## 2019-08-28 NOTE — Progress Notes (Signed)
[place] CONSULT NOTE  Patient Care Team: Alvester Morin, MD as PCP - General (Family Medicine) [name], RN as Oncology Nurse Navigator  CHIEF COMPLAINTS/PURPOSE OF CONSULTATION: LEFT LUNG MASS     Oncology History Overview Note  #April 2021-CT/PET scan left lung mass approximately 4-5cm; 12 mediastinal lymph node [initially noted May 2020-incidental/hospital]; high risk for biopsy; Dr.Gonzalez; no biopsy.  Clinically stage III lung cancer ;may/June2021- Definitive radiation  #Right sided stroke/weakness/ [wheelchair-bound/[facility] resident]; dysarthria; Hx of MVR    Primary cancer of left upper lobe of lung ([place])  09/01/2019 Initial Diagnosis   Primary cancer of left upper lobe of lung ([place])      HISTORY OF PRESENTING ILLNESS: Patient is minimally verbal because of dysarthria. Dillon Garcia 64 y.o.  male history of smoking is here for further evaluation and recommendations for lung mass.  In the interim patient has been evaluated by pulmonary; given his tenuous respiratory status/stroke patient felt a poor candidate for general anesthesia/bronchoscopy.   Today patient is accompanied by his brother Dillon Garcia to discuss further recommendations.  Patient denies any significant new onset of pain.  Continues to have chronic weakness of his right upper and lower extremities.   Review of Systems  Unable to perform ROS: Patient nonverbal     MEDICAL HISTORY:  Past Medical History:  Diagnosis Date  . GERD (gastroesophageal reflux disease)   . Heart disease   . HTN (hypertension)   . Hyperlipidemia   . Hypothyroid   . Infectious endocarditis 2012  . Mitral valve disease 2012    SURGICAL HISTORY: Past Surgical History:  Procedure Laterality Date  . MEDIAN STERNOTOMY     CABG? valve replacement?  Marland Kitchen MITRAL VALVE REPLACEMENT  2012    SOCIAL HISTORY: Social History   Socioeconomic History  . Marital status: Single    Spouse name: Not on  file  . Number of children: Not on file  . Years of education: Not on file  . Highest education level: Not on file  Occupational History  . Not on file  Tobacco Use  . Smoking status: Former Smoker    Quit date: 08/21/2018    Years since quitting: 1.0  . Smokeless tobacco: Never Used  Substance and Sexual Activity  . Alcohol use: Not Currently  . Drug use: Not Currently  . Sexual activity: Not Currently  Other Topics Concern  . Not on file  Social History Narrative   White [employer]; used to work in [employer]; ; never married; used to smoke; no alochol.    Social Determinants of Health   Financial Resource Strain:   . Difficulty of Paying Living Expenses:   Food Insecurity:   . Worried About Charity fundraiser in the Last Year:   . Arboriculturist in the Last Year:   Transportation Needs:   . Film/video editor (Medical):   Marland Kitchen Lack of Transportation (Non-Medical):   Physical Activity:   . Days of Exercise per Week:   . Minutes of Exercise per Session:   Stress:   . Feeling of Stress :   Social Connections:   . Frequency of Communication with Friends and Family:   . Frequency of Social Gatherings with Friends and Family:   . Attends Religious Services:   . Active Member of Clubs or Organizations:   . Attends Archivist Meetings:   Marland Kitchen Marital Status:   Intimate Partner Violence:   . Fear of Current  or Ex-Partner:   . Emotionally Abused:   Marland Kitchen Physically Abused:   . Sexually Abused:     FAMILY HISTORY: No family history on file.  ALLERGIES:  has No Known Allergies.  MEDICATIONS:  Current Outpatient Medications  Medication Sig Dispense Refill  . acetaminophen (TYLENOL) 325 MG tablet Take 2 tablets (650 mg total) by mouth every 4 (four) hours as needed for mild pain (or temp > 37.5 C (99.5 F)).    Marland Kitchen amLODipine (NORVASC) 10 MG tablet Take 10 mg by mouth daily.     Marland Kitchen ammonium lactate (LAC-HYDRIN) 12 % lotion Apply 1 application topically daily.    Marland Kitchen  aspirin 325 MG tablet Take 1 tablet (325 mg total) by mouth daily. 30 tablet 11  . atorvastatin (LIPITOR) 10 MG tablet Take 1 tablet (10 mg total) by mouth daily at 6 PM. 30 tablet 6  . citalopram (CELEXA) 10 MG tablet Take 10 mg by mouth daily.    . famotidine (PEPCID) 20 MG tablet Take 1 tablet (20 mg total) by mouth 2 (two) times daily. (Patient taking differently: Take 20 mg by mouth daily. ) 60 tablet 6  . gabapentin (NEURONTIN) 100 MG capsule Take 200 mg by mouth 2 (two) times daily.    Marland Kitchen levETIRAcetam (KEPPRA) 500 MG tablet Take 500 mg by mouth 2 (two) times daily.    . Maltodextrin-Xanthan Gum (RESOURCE THICKENUP CLEAR) POWD Thicken liquids to honey thick texture 1 Can 6  . Multiple Vitamin (MULTIVITAMIN WITH MINERALS) TABS tablet Take 1 tablet by mouth daily.    . multivitamin (PROSIGHT) TABS tablet Take 1 tablet by mouth daily. 30 each 0  . senna-docusate (SENOKOT-S) 8.6-50 MG tablet Take 1 tablet by mouth at bedtime as needed for mild constipation. 30 tablet 2   No current facility-administered medications for this visit.       Marland Kitchen  PHYSICAL EXAMINATION: ECOG PERFORMANCE STATUS: 0 - Asymptomatic  Vitals:   08/28/19 0857  BP: 121/82  Pulse: 82  Temp: (!) 96 F (35.6 C)  SpO2: 100%   Filed Weights    Physical Exam  Constitutional: He is oriented to person, place, and time and well-developed, well-nourished, and in no distress.  Patient is wheelchair-bound.  Accompanied by his brother.  HENT:  Head: Normocephalic and atraumatic.  Mouth/Throat: Oropharynx is clear and moist. No oropharyngeal exudate.  Eyes: Pupils are equal, round, and reactive to light.  Cardiovascular: Normal rate and regular rhythm.  Pulmonary/Chest: No respiratory distress. He has no wheezes.  Abdominal: Soft. Bowel sounds are normal. He exhibits no distension and no mass. There is no abdominal tenderness. There is no rebound and no guarding.  Musculoskeletal:        General: No tenderness or edema.  Normal range of motion.     Cervical back: Normal range of motion and neck supple.  Neurological: He is alert and oriented to person, place, and time.  Chronic weakness of the right upper lower extremity.  Skin: Skin is warm.  Psychiatric: Affect normal.     LABORATORY DATA:  I have reviewed the data as listed Lab Results  Component Value Date   WBC 8.9 08/09/2019   HGB 13.9 08/09/2019   HCT 42.9 08/09/2019   MCV 83.3 08/09/2019   PLT 268 08/09/2019   Recent Labs    09/11/18 1303 09/11/18 1303 09/12/18 0340 08/05/19 1131 08/09/19 1002  NA 139  --  141  --  141  K 4.0  --  3.9  --  3.6  CL 106  --  106  --  105  CO2 27  --  28  --  27  GLUCOSE 91  --  81  --  103*  BUN 11  --  15  --  9  CREATININE 0.84   < > 0.83 0.90 0.88  CALCIUM 8.8*  --  9.2  --  9.0  GFRNONAA >60  --  >60  --  >60  GFRAA >60  --  >60  --  >60  PROT 6.4*  --  6.5  --  7.3  ALBUMIN 3.4*  --  3.3*  --  3.2*  AST 39  --  43*  --  23  ALT 40  --  49*  --  29  ALKPHOS 79  --  79  --  80  BILITOT 0.5  --  0.8  --  0.3   < > = values in this interval not displayed.    RADIOGRAPHIC STUDIES: I have personally reviewed the radiological images as listed and agreed with the findings in the report. CT CHEST W CONTRAST  Addendum Date: 08/05/2019   ADDENDUM REPORT: 08/05/2019 12:16 ADDENDUM: These results will be called to the ordering clinician or representative by the Radiologist Assistant, and communication documented in the PACS or [employer]. Electronically Signed   By: Lorin Picket M.D.   On: 08/05/2019 12:16   Result Date: 08/05/2019 CLINICAL DATA:  History of COVID infection.  Lung mass. EXAM: CT CHEST WITH CONTRAST TECHNIQUE: Multidetector CT imaging of the chest was performed during intravenous contrast administration. CONTRAST:  84mL OMNIPAQUE IOHEXOL 300 MG/ML  SOLN COMPARISON:  09/12/2018. FINDINGS: Cardiovascular: Atherosclerotic calcification of the aorta. Heart size normal. No  pericardial effusion. Mediastinum/Nodes: Mediastinal lymph nodes measure up to 12 mm anterior to the carina, unchanged. There is left hilar lymphoid tissue without a discrete measurable lymph node. No right hilar or axillary adenopathy. Esophagus is unremarkable. Lungs/Pleura: Centrilobular emphysema. Image quality in the mid and lower lung zones is degraded by respiratory motion. Lingular mass measures 4.1 x 5.0 cm and exerts mass effect on the adjacent left major fissure. Associated obstruction of the lingular bronchus. Minimal subpleural scarring in the lateral left lower lobe. No pleural fluid. Airway is otherwise unremarkable. Upper Abdomen: Visualized portions of the liver and gallbladder are unremarkable. 1.8 cm hyperdense nodule off the medial limb right adrenal gland, stable in size from 09/12/2018. Visualized portion of the left adrenal gland is unremarkable. Partially imaged low-attenuation lesion in the left suprarenal fossa measures 5.4 cm, similar, likely originating from the left kidney. Visualized portions of the spleen, pancreas, stomach and bowel are otherwise grossly unremarkable. Gastrohepatic ligament lymph nodes are not enlarged by CT size criteria. Musculoskeletal: Degenerative changes in the spine. Old rib fractures. No worrisome lytic or sclerotic lesions. IMPRESSION: 1. Lingular mass is most indicative of primary bronchogenic carcinoma. 2. Right adrenal nodule is stable from 09/12/2018, favoring an adenoma. Definitive diagnosis on today's study is not possible without precontrast or delayed postcontrast imaging. 3.  Aortic atherosclerosis (ICD10-I70.0). 4.  Emphysema (ICD10-J43.9). Electronically Signed: By: Lorin Picket M.D. On: 08/05/2019 11:58   MR Brain W Wo Contrast  Result Date: 08/29/2019 CLINICAL DATA:  Lung mass EXAM: MRI HEAD WITHOUT AND WITH CONTRAST TECHNIQUE: Multiplanar, multiecho pulse sequences of the brain and surrounding structures were obtained without and with  intravenous contrast. CONTRAST:  7.50mL GADAVIST GADOBUTROL 1 MMOL/ML IV SOLN COMPARISON:  2019 FINDINGS: Brain: There is no acute infarction  or intracranial hemorrhage. There is no intracranial mass, mass effect, or edema. There is no hydrocephalus or extra-axial fluid collection. There is no abnormal enhancement. Large chronic infarct of the left cerebellar hemisphere. Additional chronic infarcts of the left pons and right cerebellum. Gliosis and chronic blood products at the site of a prior small right frontal cortical/subcortical infarct. Scattered foci of susceptibility compatible with chronic blood products or mineralization. Vascular: Major vessel flow voids at the skull base are preserved. Skull and upper cervical spine: Normal marrow signal is preserved. Sinuses/Orbits: Paranasal sinuses are aerated. Orbits are unremarkable. Other: Sella is unremarkable.  Mastoid air cells are clear. IMPRESSION: No evidence of intracranial metastatic disease. Chronic findings detailed above including multiple infarcts. Electronically Signed   By: Macy Mis M.D.   On: 08/29/2019 12:47   NM PET Image Initial (PI) Skull Base To Thigh  Result Date: 08/14/2019 CLINICAL DATA:  Initial treatment strategy for lung mass. EXAM: NUCLEAR MEDICINE PET SKULL BASE TO THIGH TECHNIQUE: 7.9 mCi F-18 FDG was injected intravenously. Full-ring PET imaging was performed from the skull base to thigh after the radiotracer. CT data was obtained and used for attenuation correction and anatomic localization. Fasting blood glucose: 87 mg/dl COMPARISON:  CT chest 08/05/2019. FINDINGS: Mediastinal blood pool activity: SUV max 1.9 Liver activity: SUV max NA NECK: Photopenic defect in the left cerebellar hemisphere corresponds to encephalomalacia on CT. Nasopharyngeal uptake, asymmetric on the left, but without a CT correlate and likely physiologic. No hypermetabolic lymph nodes. Incidental CT findings: None. CHEST: Lingular mass measures 3.9 x  4.9 cm with an SUV max of 17.6. Low left paratracheal lymph nodes measure up to 11 mm (3/88) with an SUV max of 2.3. No hypermetabolic hilar or axillary lymph nodes. Focal distal esophageal uptake may be physiologic. No CT correlate. Incidental CT findings: Atherosclerotic calcification of the aorta, aortic valve and coronary arteries. Heart size normal. No pericardial or pleural effusion. Centrilobular emphysema. ABDOMEN/PELVIS: 1.5 cm right adrenal nodule measures 25 Hounsfield units with an SUV max of 3.1. It does appear stable in size from 09/12/2018. Otherwise, no abnormal hypermetabolism in the liver, left adrenal gland, spleen or pancreas. Mild hypermetabolism within the prostate. No hypermetabolic lymph nodes. Incidental CT findings: Subcentimeter low-attenuation lesion in the right hepatic lobe is too small to characterize. Liver, gallbladder and left adrenal gland are otherwise unremarkable. Low-attenuation lesions in the kidneys measure up to 7.9 cm on the left and are likely cysts although definitive characterization is limited without post-contrast imaging and/or due to size. A thin calcified septum in the largest lesion is indicative of mild complexity. Tiny stone in the left kidney. Spleen, pancreas, stomach and bowel are unremarkable. Atherosclerotic calcification of the aorta without aneurysm. SKELETON: No abnormal hypermetabolism. Incidental CT findings: Degenerative changes in the spine. IMPRESSION: 1. Hypermetabolic lingular mass with minimally hypermetabolic ipsilateral mediastinal lymph nodes, findings most consistent with primary bronchogenic carcinoma (T2bN2M0 or stage IIIA disease). 2. Right adrenal nodule shows mild hypermetabolism and has indeterminate attenuation characteristics. Stability from 09/12/2018 is reassuring for a benign lesion, namely, a lipid poor adenoma. 3. Mild prostate hypermetabolism is nonspecific and can be seen with prostatitis. Please correlate clinically. 4. Tiny  left renal stone. 5. Aortic atherosclerosis (ICD10-I70.0). Coronary artery calcification. Electronically Signed   By: Lorin Picket M.D.   On: 08/14/2019 14:02    ASSESSMENT & PLAN:   Mass of upper lobe of left lung #Stage III -lung cancer [no biopsy] clinical non-small cell lung cancer.  #Recommend proceeding with  definitive radiation; however reviewed with the patient's brother at high risk for recurrence.  In general even with chemoradiation-cure rate in the order of 20%.  Will recommend a biopsy if easily accessible at the time of recurrence.  # hx of stroke/ right sided weakness chronic; given stage III malignancy recommend MRI.  #Discussed palliative care evaluation; evaluation next visit.  Discussed the patient's brother the seriousness of the illness; overall poor prognosis.  # DISPOSITION:  #MRI brain # follow up 1 month- MD; labs-  Cbc/cmp-dr.B  Addendum: MRI brain negative for any acute process/metastatic disease.  Patient/family to be informed.   All questions were answered. The patient knows to call the clinic with any problems, questions or concerns.    [name], MD 09/01/2019 5:01 PM

## 2019-08-28 NOTE — Progress Notes (Signed)
  Oncology Nurse Navigator Documentation  Navigator Location: [facility]Med Onc (08/28/19 1200)   )Navigator Encounter Type: Initial RadOnc;Follow-up Appt (08/28/19 1200)                     Patient Visit Type: MedOnc;RadOnc (08/28/19 1200) Treatment Phase: Pre-Tx/Tx Discussion (08/28/19 1200) Barriers/Navigation Needs: Coordination of Care (08/28/19 1200)   Interventions: Coordination of Care (08/28/19 1200)   Coordination of Care: Appts;Radiology (08/28/19 1200)         met with patient and his brother to discuss next steps. All questions answered during visit. Reviewed upcoming appts. Contact info given to pt's brother and instructed to call with any further questions or needs. Pt and his brother verbalized understanding. Nothing further needed at this time.         Time Spent with Patient: 90 (08/28/19 1200)

## 2019-08-28 NOTE — Assessment & Plan Note (Addendum)
#  Stage III -lung cancer [no biopsy] clinical non-small cell lung cancer.  #Recommend proceeding with definitive radiation; however reviewed with the patient's brother at high risk for recurrence.  In general even with chemoradiation-cure rate in the order of 20%.  Will recommend a biopsy if easily accessible at the time of recurrence.  # hx of stroke/ right sided weakness chronic; given stage III malignancy recommend MRI.  #Discussed palliative care evaluation; evaluation next visit.  Discussed the patient's brother the seriousness of the illness; overall poor prognosis.  # DISPOSITION:  #MRI brain # follow up 1 month- MD; labs-  Cbc/cmp-dr.B  Addendum: MRI brain negative for any acute process/metastatic disease.  Patient/family to be informed.

## 2019-08-29 ENCOUNTER — Ambulatory Visit
Admission: RE | Admit: 2019-08-29 | Discharge: 2019-08-29 | Disposition: A | Discharge status: Home / Self Care | Payer: Medicare Other | Source: Ambulatory Visit | Attending: Internal Medicine | Admitting: Internal Medicine

## 2019-08-29 DIAGNOSIS — R918 Other nonspecific abnormal finding of lung field: Secondary | ICD-10-CM | POA: Diagnosis present

## 2019-08-29 MED ORDER — GADOBUTROL 1 MMOL/ML IV SOLN
7.5000 mL | Freq: Once | INTRAVENOUS | Status: AC | PRN
  Administered 2019-08-29: 7.5 mL via INTRAVENOUS

## 2019-09-01 DIAGNOSIS — C3412 Malignant neoplasm of upper lobe, left bronchus or lung: Secondary | ICD-10-CM | POA: Insufficient documentation

## 2019-09-01 DIAGNOSIS — Z7189 Other specified counseling: Secondary | ICD-10-CM | POA: Insufficient documentation

## 2019-09-10 ENCOUNTER — Encounter: Payer: Self-pay | Admitting: *Deleted

## 2019-09-10 ENCOUNTER — Ambulatory Visit
Admission: RE | Admit: 2019-09-10 | Discharge: 2019-09-10 | Disposition: A | Discharge status: Home / Self Care | Payer: Medicare Other | Source: Ambulatory Visit | Attending: Radiation Oncology | Admitting: Radiation Oncology

## 2019-09-10 DIAGNOSIS — Z510 Encounter for antineoplastic radiation therapy: Secondary | ICD-10-CM | POA: Diagnosis not present

## 2019-09-10 DIAGNOSIS — C3412 Malignant neoplasm of upper lobe, left bronchus or lung: Secondary | ICD-10-CM | POA: Insufficient documentation

## 2019-09-10 NOTE — Progress Notes (Signed)
  Oncology Nurse Navigator Documentation  Navigator Location: [facility] (09/10/19 1400)   )Navigator Encounter Type: Other:(CT sim) (09/10/19 1400)                     Patient Visit Type: FBPZWC (09/10/19 1400) Treatment Phase: CT SIM (09/10/19 1400) Barriers/Navigation Needs: Coordination of Care (09/10/19 1400)   Interventions: Coordination of Care (09/10/19 1400)   Coordination of Care: Appts (09/10/19 1400)         met with patient and his brother during CT sim appt today. All questions answered during visit. Reviewed upcoming appts. Pt and his brother given print out of appts. Instructed to call back with any further questions or needs. Pt and his brother verbalized understanding.         Time Spent with Patient: 45 (09/10/19 1400)

## 2019-09-19 DIAGNOSIS — Z510 Encounter for antineoplastic radiation therapy: Secondary | ICD-10-CM | POA: Insufficient documentation

## 2019-09-19 DIAGNOSIS — C3412 Malignant neoplasm of upper lobe, left bronchus or lung: Secondary | ICD-10-CM | POA: Insufficient documentation

## 2019-09-24 ENCOUNTER — Encounter: Payer: Self-pay | Admitting: *Deleted

## 2019-09-24 ENCOUNTER — Ambulatory Visit
Admission: RE | Admit: 2019-09-24 | Discharge: 2019-09-24 | Disposition: A | Discharge status: Home / Self Care | Payer: Medicare Other | Source: Ambulatory Visit | Attending: Radiation Oncology | Admitting: Radiation Oncology

## 2019-09-24 DIAGNOSIS — C3412 Malignant neoplasm of upper lobe, left bronchus or lung: Secondary | ICD-10-CM | POA: Diagnosis not present

## 2019-09-24 NOTE — Progress Notes (Signed)
  Oncology Nurse Navigator Documentation  Navigator Location: [facility] (09/24/19 1100)   )Navigator Encounter Type: Treatment (09/24/19 1100)                   Treatment Initiated Date: 09/24/19 (09/24/19 1100) Patient Visit Type: RadOnc (09/24/19 1100) Treatment Phase: First Radiation Tx (09/24/19 1100) Barriers/Navigation Needs: No Barriers At This Time (09/24/19 1100)   Interventions: None Required (09/24/19 1100)                      Time Spent with Patient: 15 (09/24/19 1100)

## 2019-09-25 ENCOUNTER — Other Ambulatory Visit: Payer: Self-pay | Admitting: *Deleted

## 2019-09-25 ENCOUNTER — Inpatient Hospital Stay (HOSPITAL_BASED_OUTPATIENT_CLINIC_OR_DEPARTMENT_OTHER): Payer: Medicare Other | Admitting: Internal Medicine

## 2019-09-25 ENCOUNTER — Inpatient Hospital Stay: Payer: Medicare Other | Attending: Internal Medicine

## 2019-09-25 ENCOUNTER — Ambulatory Visit
Admission: RE | Admit: 2019-09-25 | Discharge: 2019-09-25 | Disposition: A | Discharge status: Home / Self Care | Payer: Medicare Other | Source: Ambulatory Visit | Attending: Radiation Oncology | Admitting: Radiation Oncology

## 2019-09-25 ENCOUNTER — Other Ambulatory Visit: Payer: Self-pay

## 2019-09-25 ENCOUNTER — Encounter: Payer: Self-pay | Admitting: Internal Medicine

## 2019-09-25 DIAGNOSIS — C3412 Malignant neoplasm of upper lobe, left bronchus or lung: Secondary | ICD-10-CM

## 2019-09-25 DIAGNOSIS — R531 Weakness: Secondary | ICD-10-CM | POA: Diagnosis not present

## 2019-09-25 DIAGNOSIS — R471 Dysarthria and anarthria: Secondary | ICD-10-CM | POA: Diagnosis not present

## 2019-09-25 DIAGNOSIS — E039 Hypothyroidism, unspecified: Secondary | ICD-10-CM | POA: Diagnosis not present

## 2019-09-25 DIAGNOSIS — K219 Gastro-esophageal reflux disease without esophagitis: Secondary | ICD-10-CM | POA: Diagnosis not present

## 2019-09-25 DIAGNOSIS — E785 Hyperlipidemia, unspecified: Secondary | ICD-10-CM | POA: Insufficient documentation

## 2019-09-25 DIAGNOSIS — Z87891 Personal history of nicotine dependence: Secondary | ICD-10-CM | POA: Diagnosis not present

## 2019-09-25 DIAGNOSIS — Z79899 Other long term (current) drug therapy: Secondary | ICD-10-CM | POA: Diagnosis not present

## 2019-09-25 DIAGNOSIS — Z923 Personal history of irradiation: Secondary | ICD-10-CM | POA: Diagnosis not present

## 2019-09-25 DIAGNOSIS — I10 Essential (primary) hypertension: Secondary | ICD-10-CM | POA: Diagnosis not present

## 2019-09-25 DIAGNOSIS — Z8673 Personal history of transient ischemic attack (TIA), and cerebral infarction without residual deficits: Secondary | ICD-10-CM | POA: Diagnosis not present

## 2019-09-25 LAB — CBC WITH DIFFERENTIAL/PLATELET
Abs Immature Granulocytes: 0.02 10*3/uL (ref 0.00–0.07)
Basophils Absolute: 0.1 10*3/uL (ref 0.0–0.1)
Basophils Relative: 1 %
Eosinophils Absolute: 0.3 10*3/uL (ref 0.0–0.5)
Eosinophils Relative: 4 %
HCT: 42.4 % (ref 39.0–52.0)
Hemoglobin: 13.7 g/dL (ref 13.0–17.0)
Immature Granulocytes: 0 %
Lymphocytes Relative: 21 %
Lymphs Abs: 1.7 10*3/uL (ref 0.7–4.0)
MCH: 26.3 pg (ref 26.0–34.0)
MCHC: 32.3 g/dL (ref 30.0–36.0)
MCV: 81.4 fL (ref 80.0–100.0)
Monocytes Absolute: 1 10*3/uL (ref 0.1–1.0)
Monocytes Relative: 12 %
Neutro Abs: 4.9 10*3/uL (ref 1.7–7.7)
Neutrophils Relative %: 62 %
Platelets: 239 10*3/uL (ref 150–400)
RBC: 5.21 MIL/uL (ref 4.22–5.81)
RDW: 14.4 % (ref 11.5–15.5)
WBC: 8 10*3/uL (ref 4.0–10.5)
nRBC: 0 % (ref 0.0–0.2)

## 2019-09-25 LAB — COMPREHENSIVE METABOLIC PANEL
ALT: 13 U/L (ref 0–44)
AST: 17 U/L (ref 15–41)
Albumin: 3.3 g/dL — ABNORMAL LOW (ref 3.5–5.0)
Alkaline Phosphatase: 82 U/L (ref 38–126)
Anion gap: 10 (ref 5–15)
BUN: 10 mg/dL (ref 8–23)
CO2: 27 mmol/L (ref 22–32)
Calcium: 9.1 mg/dL (ref 8.9–10.3)
Chloride: 106 mmol/L (ref 98–111)
Creatinine, Ser: 0.89 mg/dL (ref 0.61–1.24)
GFR calc Af Amer: >60 mL/min (ref >60)
GFR calc non Af Amer: >60 mL/min (ref >60)
Glucose, Bld: 93 mg/dL (ref 70–99)
Potassium: 3.3 mmol/L — ABNORMAL LOW (ref 3.5–5.1)
Sodium: 143 mmol/L (ref 135–145)
Total Bilirubin: 0.5 mg/dL (ref 0.3–1.2)
Total Protein: 7.4 g/dL (ref 6.5–8.1)

## 2019-09-25 NOTE — Progress Notes (Signed)
[place] CONSULT NOTE  Patient Care Team: Alvester Morin, MD as PCP - General (Family Medicine) [name], RN as Oncology Nurse Navigator  CHIEF COMPLAINTS/PURPOSE OF CONSULTATION: LEFT LUNG MASS     Oncology History Overview Note  #April 2021-CT/PET scan left lung mass approximately 4-5cm; 12 mediastinal lymph node [initially noted May 2020-incidental/hospital]; high risk for biopsy; Dr.Gonzalez; no biopsy.  Clinically stage III lung cancer ;may/June2021- Definitive radiation  #Right sided stroke/weakness/ [wheelchair-bound/[facility] resident]; dysarthria; Hx of MVR    Primary cancer of left upper lobe of lung ([place])  09/01/2019 Initial Diagnosis   Primary cancer of left upper lobe of lung ([place])      HISTORY OF PRESENTING ILLNESS: Patient is minimally verbal because of dysarthria.  Dillon Garcia 64 y.o.  male history of smoking-clinical stage III non-small cell lung cancer [no biopsy] currently on radiation is here for follow-up.  Patient states he is tolerating treatment fairly well.  Denies any difficulty swallowing or pain with swallowing.  Denies any worsening pain.  Continues to have chronic weakness of his right upper and lower extremities.   Review of Systems  Unable to perform ROS: Patient nonverbal     MEDICAL HISTORY:  Past Medical History:  Diagnosis Date  . GERD (gastroesophageal reflux disease)   . Heart disease   . HTN (hypertension)   . Hyperlipidemia   . Hypothyroid   . Infectious endocarditis 2012  . Mitral valve disease 2012    SURGICAL HISTORY: Past Surgical History:  Procedure Laterality Date  . MEDIAN STERNOTOMY     CABG? valve replacement?  Marland Kitchen MITRAL VALVE REPLACEMENT  2012    SOCIAL HISTORY: Social History   Socioeconomic History  . Marital status: Single    Spouse name: Not on file  . Number of children: Not on file  . Years of education: Not on file  . Highest education level: Not on file   Occupational History  . Not on file  Tobacco Use  . Smoking status: Former Smoker    Quit date: 08/21/2018    Years since quitting: 1.0  . Smokeless tobacco: Never Used  Vaping Use  . Vaping Use: Never used  Substance and Sexual Activity  . Alcohol use: Not Currently  . Drug use: Not Currently  . Sexual activity: Not Currently  Other Topics Concern  . Not on file  Social History Narrative   [employer]; used to work in [employer]; ; never married; used to smoke; no alochol.    Social Determinants of Health   Financial Resource Strain:   . Difficulty of Paying Living Expenses:   Food Insecurity:   . Worried About Charity fundraiser in the Last Year:   . Arboriculturist in the Last Year:   Transportation Needs:   . Film/video editor (Medical):   Marland Kitchen Lack of Transportation (Non-Medical):   Physical Activity:   . Days of Exercise per Week:   . Minutes of Exercise per Session:   Stress:   . Feeling of Stress :   Social Connections:   . Frequency of Communication with Friends and Family:   . Frequency of Social Gatherings with Friends and Family:   . Attends Religious Services:   . Active Member of Clubs or Organizations:   . Attends Archivist Meetings:   Marland Kitchen Marital Status:   Intimate Partner Violence:   . Fear of Current or Ex-Partner:   . Emotionally Abused:   .  Physically Abused:   . Sexually Abused:     FAMILY HISTORY: No family history on file.  ALLERGIES:  has No Known Allergies.  MEDICATIONS:  Current Outpatient Medications  Medication Sig Dispense Refill  . acetaminophen (TYLENOL) 325 MG tablet Take 2 tablets (650 mg total) by mouth every 4 (four) hours as needed for mild pain (or temp > 37.5 C (99.5 F)).    Marland Kitchen amLODipine (NORVASC) 10 MG tablet Take 10 mg by mouth daily.     Marland Kitchen ammonium lactate (LAC-HYDRIN) 12 % lotion Apply 1 application topically daily.    Marland Kitchen aspirin 325 MG tablet Take 1 tablet (325 mg total) by mouth daily. 30 tablet 11   . atorvastatin (LIPITOR) 10 MG tablet Take 1 tablet (10 mg total) by mouth daily at 6 PM. 30 tablet 6  . chlorhexidine (PERIDEX) 0.12 % solution 15 mLs by Mouth Rinse route 3 (three) times daily after meals.    . citalopram (CELEXA) 10 MG tablet Take 10 mg by mouth daily.    . famotidine (PEPCID) 20 MG tablet Take 1 tablet (20 mg total) by mouth 2 (two) times daily. (Patient taking differently: Take 20 mg by mouth daily. ) 60 tablet 6  . gabapentin (NEURONTIN) 100 MG capsule Take 200 mg by mouth 2 (two) times daily.    Marland Kitchen levETIRAcetam (KEPPRA) 500 MG tablet Take 500 mg by mouth 2 (two) times daily.    . Maltodextrin-Xanthan Gum (RESOURCE THICKENUP CLEAR) POWD Thicken liquids to honey thick texture 1 Can 6  . Multiple Vitamin (MULTIVITAMIN WITH MINERALS) TABS tablet Take 1 tablet by mouth daily.    . multivitamin (PROSIGHT) TABS tablet Take 1 tablet by mouth daily. 30 each 0  . senna-docusate (SENOKOT-S) 8.6-50 MG tablet Take 1 tablet by mouth at bedtime as needed for mild constipation. 30 tablet 2   No current facility-administered medications for this visit.       Marland Kitchen  PHYSICAL EXAMINATION: ECOG PERFORMANCE STATUS: 0 - Asymptomatic  Vitals:   09/25/19 0910  BP: 100/81  Pulse: 88  Temp: (!) 96.8 F (36 C)  SpO2: 100%   Filed Weights    Physical Exam Constitutional:      Comments: Patient is wheelchair-bound.  Alone.  HENT:     Head: Normocephalic and atraumatic.     Mouth/Throat:     Mouth: Oropharynx is clear and moist.     Pharynx: No oropharyngeal exudate.  Eyes:     Pupils: Pupils are equal, round, and reactive to light.  Cardiovascular:     Rate and Rhythm: Normal rate and regular rhythm.  Pulmonary:     Effort: No respiratory distress.     Breath sounds: No wheezing.  Abdominal:     General: Bowel sounds are normal. There is no distension.     Palpations: Abdomen is soft. There is no mass.     Tenderness: There is no abdominal tenderness. There is no guarding or  rebound.  Musculoskeletal:        General: No tenderness or edema. Normal range of motion.     Cervical back: Normal range of motion and neck supple.  Skin:    General: Skin is warm.  Neurological:     Mental Status: He is alert and oriented to person, place, and time.     Comments: Chronic weakness of the right upper and lower extremity.  Psychiatric:        Mood and Affect: Affect normal.     LABORATORY DATA:  I have reviewed the data as listed Lab Results  Component Value Date   WBC 8.0 09/25/2019   HGB 13.7 09/25/2019   HCT 42.4 09/25/2019   MCV 81.4 09/25/2019   PLT 239 09/25/2019   Recent Labs    08/05/19 1131 08/09/19 1002 09/25/19 0844  NA  --  141 143  K  --  3.6 3.3*  CL  --  105 106  CO2  --  27 27  GLUCOSE  --  103* 93  BUN  --  9 10  CREATININE 0.90 0.88 0.89  CALCIUM  --  9.0 9.1  GFRNONAA  --  >60 >60  GFRAA  --  >60 >60  PROT  --  7.3 7.4  ALBUMIN  --  3.2* 3.3*  AST  --  23 17  ALT  --  29 13  ALKPHOS  --  80 82  BILITOT  --  0.3 0.5    RADIOGRAPHIC STUDIES: I have personally reviewed the radiological images as listed and agreed with the findings in the report. MR Brain W Wo Contrast  Result Date: 08/29/2019 CLINICAL DATA:  Lung mass EXAM: MRI HEAD WITHOUT AND WITH CONTRAST TECHNIQUE: Multiplanar, multiecho pulse sequences of the brain and surrounding structures were obtained without and with intravenous contrast. CONTRAST:  7.58mL GADAVIST GADOBUTROL 1 MMOL/ML IV SOLN COMPARISON:  2019 FINDINGS: Brain: There is no acute infarction or intracranial hemorrhage. There is no intracranial mass, mass effect, or edema. There is no hydrocephalus or extra-axial fluid collection. There is no abnormal enhancement. Large chronic infarct of the left cerebellar hemisphere. Additional chronic infarcts of the left pons and right cerebellum. Gliosis and chronic blood products at the site of a prior small right frontal cortical/subcortical infarct. Scattered foci of  susceptibility compatible with chronic blood products or mineralization. Vascular: Major vessel flow voids at the skull base are preserved. Skull and upper cervical spine: Normal marrow signal is preserved. Sinuses/Orbits: Paranasal sinuses are aerated. Orbits are unremarkable. Other: Sella is unremarkable.  Mastoid air cells are clear. IMPRESSION: No evidence of intracranial metastatic disease. Chronic findings detailed above including multiple infarcts. Electronically Signed   By: Macy Mis M.D.   On: 08/29/2019 12:47    ASSESSMENT & PLAN:   Primary cancer of left upper lobe of lung ([place]) #Stage III -lung cancer [no biopsy] clinical non-small cell lung cancer; currently on definitive radiation [June 21st, 2021].  Clinically stable.  Tolerating this well.  #Discussed we will plan to do restaging imaging approximately 2 months post radiation.  Will order next visit.  # hx of stroke/ right sided weakness chronic; mri brain-wnl.  Clinically stable.  # # Palliative care evaluation: Introduced palliative care philosophy and services. I discussed the need for palliative care evaluation/symptom management to help quality of life in the context of incurable disease.  Patient is interested; will make referral.  # # I offered to call the patient's family to give an update on clinical status; patient declines.   # DISPOSITION:  # palliative care-Dillon Garcia/next week with Radiation appt # follow up 1 month- MD; labs-  Cbc/cmp-dr.B    All questions were answered. The patient knows to call the clinic with any problems, questions or concerns.    [name], MD 09/26/2019 5:24 PM

## 2019-09-25 NOTE — Assessment & Plan Note (Addendum)
#  Stage III -lung cancer [no biopsy] clinical non-small cell lung cancer; currently on definitive radiation [June 21st, 2021].  Clinically stable.  Tolerating this well.  #Discussed we will plan to do restaging imaging approximately 2 months post radiation.  Will order next visit.  # hx of stroke/ right sided weakness chronic; mri brain-wnl.  Clinically stable.  # # Palliative care evaluation: Introduced palliative care philosophy and services. I discussed the need for palliative care evaluation/symptom management to help quality of life in the context of incurable disease.  Patient is interested; will make referral.  # # I offered to call the patient's family to give an update on clinical status; patient declines.   # DISPOSITION:  # palliative care-Josh/next week with Radiation appt # follow up 1 month- MD; labs-  Cbc/cmp-dr.B

## 2019-09-26 ENCOUNTER — Ambulatory Visit: Payer: Medicare Other

## 2019-09-26 ENCOUNTER — Ambulatory Visit
Admission: RE | Admit: 2019-09-26 | Discharge: 2019-09-26 | Disposition: A | Discharge status: Home / Self Care | Payer: Medicare Other | Source: Ambulatory Visit | Attending: Radiation Oncology | Admitting: Radiation Oncology

## 2019-09-26 DIAGNOSIS — C3412 Malignant neoplasm of upper lobe, left bronchus or lung: Secondary | ICD-10-CM | POA: Diagnosis not present

## 2019-09-27 ENCOUNTER — Ambulatory Visit
Admission: RE | Admit: 2019-09-27 | Discharge: 2019-09-27 | Disposition: A | Discharge status: Home / Self Care | Payer: Medicare Other | Source: Ambulatory Visit | Attending: Radiation Oncology | Admitting: Radiation Oncology

## 2019-09-27 DIAGNOSIS — C3412 Malignant neoplasm of upper lobe, left bronchus or lung: Secondary | ICD-10-CM | POA: Diagnosis not present

## 2019-09-30 ENCOUNTER — Ambulatory Visit
Admission: RE | Admit: 2019-09-30 | Discharge: 2019-09-30 | Disposition: A | Discharge status: Home / Self Care | Payer: Medicare Other | Source: Ambulatory Visit | Attending: Radiation Oncology | Admitting: Radiation Oncology

## 2019-09-30 DIAGNOSIS — C3412 Malignant neoplasm of upper lobe, left bronchus or lung: Secondary | ICD-10-CM | POA: Diagnosis not present

## 2019-10-01 ENCOUNTER — Ambulatory Visit: Payer: Medicare Other

## 2019-10-01 ENCOUNTER — Ambulatory Visit
Admission: RE | Admit: 2019-10-01 | Discharge: 2019-10-01 | Disposition: A | Discharge status: Home / Self Care | Payer: Medicare Other | Source: Ambulatory Visit | Attending: Radiation Oncology | Admitting: Radiation Oncology

## 2019-10-01 DIAGNOSIS — C3412 Malignant neoplasm of upper lobe, left bronchus or lung: Secondary | ICD-10-CM | POA: Diagnosis not present

## 2019-10-02 ENCOUNTER — Inpatient Hospital Stay: Payer: Medicare Other | Admitting: Hospice and Palliative Medicine

## 2019-10-02 ENCOUNTER — Ambulatory Visit
Admission: RE | Admit: 2019-10-02 | Discharge: 2019-10-02 | Disposition: A | Discharge status: Home / Self Care | Payer: Medicare Other | Source: Ambulatory Visit | Attending: Radiation Oncology | Admitting: Radiation Oncology

## 2019-10-02 DIAGNOSIS — C3412 Malignant neoplasm of upper lobe, left bronchus or lung: Secondary | ICD-10-CM | POA: Diagnosis not present

## 2019-10-03 ENCOUNTER — Ambulatory Visit
Admission: RE | Admit: 2019-10-03 | Discharge: 2019-10-03 | Disposition: A | Discharge status: Home / Self Care | Payer: Medicare Other | Source: Ambulatory Visit | Attending: Radiation Oncology | Admitting: Radiation Oncology

## 2019-10-03 ENCOUNTER — Ambulatory Visit: Payer: Medicare Other

## 2019-10-03 DIAGNOSIS — C3412 Malignant neoplasm of upper lobe, left bronchus or lung: Secondary | ICD-10-CM | POA: Diagnosis not present

## 2019-10-04 ENCOUNTER — Ambulatory Visit
Admission: RE | Admit: 2019-10-04 | Discharge: 2019-10-04 | Disposition: A | Discharge status: Home / Self Care | Payer: Medicare Other | Source: Ambulatory Visit | Attending: Radiation Oncology | Admitting: Radiation Oncology

## 2019-10-04 DIAGNOSIS — C3412 Malignant neoplasm of upper lobe, left bronchus or lung: Secondary | ICD-10-CM | POA: Diagnosis not present

## 2019-10-07 ENCOUNTER — Ambulatory Visit
Admission: RE | Admit: 2019-10-07 | Discharge: 2019-10-07 | Disposition: A | Discharge status: Home / Self Care | Payer: Medicare Other | Source: Ambulatory Visit | Attending: Radiation Oncology | Admitting: Radiation Oncology

## 2019-10-07 DIAGNOSIS — C3412 Malignant neoplasm of upper lobe, left bronchus or lung: Secondary | ICD-10-CM | POA: Diagnosis not present

## 2019-10-08 ENCOUNTER — Ambulatory Visit: Payer: Medicare Other

## 2019-10-10 ENCOUNTER — Ambulatory Visit: Payer: Medicare Other

## 2019-10-15 ENCOUNTER — Ambulatory Visit: Payer: Medicare Other

## 2019-10-17 ENCOUNTER — Ambulatory Visit: Payer: Medicare Other

## 2019-10-22 ENCOUNTER — Ambulatory Visit: Payer: Medicare Other

## 2019-10-23 ENCOUNTER — Encounter: Payer: Self-pay | Admitting: Internal Medicine

## 2019-10-23 ENCOUNTER — Inpatient Hospital Stay (HOSPITAL_BASED_OUTPATIENT_CLINIC_OR_DEPARTMENT_OTHER): Payer: Medicare Other | Admitting: Hospice and Palliative Medicine

## 2019-10-23 ENCOUNTER — Other Ambulatory Visit: Payer: Self-pay

## 2019-10-23 ENCOUNTER — Inpatient Hospital Stay: Payer: Medicare Other | Attending: Internal Medicine

## 2019-10-23 ENCOUNTER — Inpatient Hospital Stay (HOSPITAL_BASED_OUTPATIENT_CLINIC_OR_DEPARTMENT_OTHER): Payer: Medicare Other | Admitting: Internal Medicine

## 2019-10-23 DIAGNOSIS — Z7189 Other specified counseling: Secondary | ICD-10-CM | POA: Diagnosis not present

## 2019-10-23 DIAGNOSIS — E039 Hypothyroidism, unspecified: Secondary | ICD-10-CM | POA: Diagnosis not present

## 2019-10-23 DIAGNOSIS — R471 Dysarthria and anarthria: Secondary | ICD-10-CM | POA: Insufficient documentation

## 2019-10-23 DIAGNOSIS — I10 Essential (primary) hypertension: Secondary | ICD-10-CM | POA: Diagnosis not present

## 2019-10-23 DIAGNOSIS — I059 Rheumatic mitral valve disease, unspecified: Secondary | ICD-10-CM | POA: Diagnosis not present

## 2019-10-23 DIAGNOSIS — E785 Hyperlipidemia, unspecified: Secondary | ICD-10-CM | POA: Diagnosis not present

## 2019-10-23 DIAGNOSIS — Z79899 Other long term (current) drug therapy: Secondary | ICD-10-CM | POA: Diagnosis not present

## 2019-10-23 DIAGNOSIS — Z515 Encounter for palliative care: Secondary | ICD-10-CM | POA: Diagnosis not present

## 2019-10-23 DIAGNOSIS — C3412 Malignant neoplasm of upper lobe, left bronchus or lung: Secondary | ICD-10-CM

## 2019-10-23 DIAGNOSIS — R5383 Other fatigue: Secondary | ICD-10-CM | POA: Insufficient documentation

## 2019-10-23 DIAGNOSIS — Z87891 Personal history of nicotine dependence: Secondary | ICD-10-CM | POA: Insufficient documentation

## 2019-10-23 DIAGNOSIS — Z8673 Personal history of transient ischemic attack (TIA), and cerebral infarction without residual deficits: Secondary | ICD-10-CM | POA: Insufficient documentation

## 2019-10-23 DIAGNOSIS — Z7982 Long term (current) use of aspirin: Secondary | ICD-10-CM | POA: Insufficient documentation

## 2019-10-23 DIAGNOSIS — K219 Gastro-esophageal reflux disease without esophagitis: Secondary | ICD-10-CM | POA: Diagnosis not present

## 2019-10-23 LAB — CBC WITH DIFFERENTIAL/PLATELET
Abs Immature Granulocytes: 0.02 10*3/uL (ref 0.00–0.07)
Basophils Absolute: 0 10*3/uL (ref 0.0–0.1)
Basophils Relative: 1 %
Eosinophils Absolute: 0.3 10*3/uL (ref 0.0–0.5)
Eosinophils Relative: 4 %
HCT: 37.1 % — ABNORMAL LOW (ref 39.0–52.0)
Hemoglobin: 12.1 g/dL — ABNORMAL LOW (ref 13.0–17.0)
Immature Granulocytes: 0 %
Lymphocytes Relative: 14 %
Lymphs Abs: 1.1 10*3/uL (ref 0.7–4.0)
MCH: 25.7 pg — ABNORMAL LOW (ref 26.0–34.0)
MCHC: 32.6 g/dL (ref 30.0–36.0)
MCV: 78.8 fL — ABNORMAL LOW (ref 80.0–100.0)
Monocytes Absolute: 0.8 10*3/uL (ref 0.1–1.0)
Monocytes Relative: 11 %
Neutro Abs: 5.6 10*3/uL (ref 1.7–7.7)
Neutrophils Relative %: 70 %
Platelets: 298 10*3/uL (ref 150–400)
RBC: 4.71 MIL/uL (ref 4.22–5.81)
RDW: 14.7 % (ref 11.5–15.5)
WBC: 7.8 10*3/uL (ref 4.0–10.5)
nRBC: 0 % (ref 0.0–0.2)

## 2019-10-23 LAB — COMPREHENSIVE METABOLIC PANEL
ALT: 22 U/L (ref 0–44)
AST: 31 U/L (ref 15–41)
Albumin: 2.9 g/dL — ABNORMAL LOW (ref 3.5–5.0)
Alkaline Phosphatase: 76 U/L (ref 38–126)
Anion gap: 11 (ref 5–15)
BUN: 10 mg/dL (ref 8–23)
CO2: 29 mmol/L (ref 22–32)
Calcium: 9.1 mg/dL (ref 8.9–10.3)
Chloride: 102 mmol/L (ref 98–111)
Creatinine, Ser: 0.84 mg/dL (ref 0.61–1.24)
GFR calc Af Amer: >60 mL/min (ref >60)
GFR calc non Af Amer: >60 mL/min (ref >60)
Glucose, Bld: 135 mg/dL — ABNORMAL HIGH (ref 70–99)
Potassium: 3.3 mmol/L — ABNORMAL LOW (ref 3.5–5.1)
Sodium: 142 mmol/L (ref 135–145)
Total Bilirubin: 0.4 mg/dL (ref 0.3–1.2)
Total Protein: 7.3 g/dL (ref 6.5–8.1)

## 2019-10-23 LAB — FERRITIN: Ferritin: 313 ng/mL (ref 24–336)

## 2019-10-23 LAB — IRON AND TIBC
Iron: 25 ug/dL — ABNORMAL LOW (ref 45–182)
Saturation Ratios: 14 % — ABNORMAL LOW (ref 17.9–39.5)
TIBC: 182 ug/dL — ABNORMAL LOW (ref 250–450)
UIBC: 157 ug/dL

## 2019-10-23 NOTE — Assessment & Plan Note (Addendum)
#  Stage III -lung cancer [no biopsy] clinical non-small cell lung cancer; currently s/p definitive radiation [June 21st, 2021].  STABLE.  # will plan to get CT scan in end of Aug 2021 [2 months post treatment]  # hx of stroke/ right sided weakness chronic; mri brain-wnl. STABLE.   # # Palliative care evaluation- awaiting evaluation today.  Patient wants a DNR status.  Discussed with Josh.  #Mild anemia hemoglobin 11; mild microcytosis.-check iron studies.   # DISPOSITION: ADD IRON STUDIES/Ferritin to today labs # follow up 1st week of september- MD; labs-  Cbc/cmp; CT Chest prior--dr.B

## 2019-10-23 NOTE — Progress Notes (Signed)
[place]  [phone] [phone]   Name: Dillon Garcia Date: 10/23/2019 MRN: [id number]  DOB: 07-10-1955  Patient Care Team: Alvester Morin, MD as PCP - General (Family Medicine) [name], RN as Oncology Nurse Navigator    REASON FOR CONSULTATION: Dillon Garcia is a 64 y.o. 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  Patient is a resident of an SNF.  He is wheelchair-bound at baseline.  Palliative care was consulted to help address goals.  SOCIAL HISTORY:     reports that he quit smoking about 14 months ago. He has never used smokeless tobacco. He reports previous alcohol use. He reports previous drug use.   He is not married.  He has no children.  He is a long-[facility] resident at [facility].  Patient has a brother who is involved in his care.  ADVANCE DIRECTIVES:  Does not have  CODE STATUS: DNR/DNI (DNR form completed on 10/23/2019)  PAST MEDICAL HISTORY: Past Medical History:  Diagnosis Date   GERD (gastroesophageal reflux disease)    Heart disease    HTN (hypertension)    Hyperlipidemia    Hypothyroid    Infectious endocarditis 2012   Mitral valve disease 2012    PAST SURGICAL HISTORY:  Past Surgical History:  Procedure Laterality Date   MEDIAN STERNOTOMY     CABG? valve replacement?   MITRAL VALVE REPLACEMENT  2012    HEMATOLOGY/ONCOLOGY HISTORY:  Oncology History Overview Note  #April 2021-CT/PET scan left lung mass approximately 4-5cm; 12 mediastinal lymph node [initially noted May 2020-incidental/hospital]; high risk for biopsy; Dr.Gonzalez; no biopsy.  Clinically stage III lung cancer ;may/June2021- Definitive radiation  #Right sided stroke/weakness/ [wheelchair-bound/[facility] resident];  dysarthria; Hx of MVR    Primary cancer of left upper lobe of lung ([place])  09/01/2019 Initial Diagnosis   Primary cancer of left upper lobe of lung ([facility])     ALLERGIES:  has No Known Allergies.  MEDICATIONS:  Current Outpatient Medications  Medication Sig Dispense Refill   acetaminophen (TYLENOL) 325 MG tablet Take 2 tablets (650 mg total) by mouth every 4 (four) hours as needed for mild pain (or temp > 37.5 C (99.5 F)).     amLODipine (NORVASC) 10 MG tablet Take 10 mg by mouth daily.      ammonium lactate (LAC-HYDRIN) 12 % lotion Apply 1 application topically daily.     aspirin 325 MG tablet Take 1 tablet (325 mg total) by mouth daily. 30 tablet 11   atorvastatin (LIPITOR) 10 MG tablet Take 1 tablet (10 mg total) by mouth daily at 6 PM. 30 tablet 6   chlorhexidine (PERIDEX) 0.12 % solution 15 mLs by Mouth Rinse route 3 (three) times daily after meals.     citalopram (CELEXA) 10 MG tablet Take 10 mg by mouth daily.     famotidine (PEPCID) 20 MG tablet Take 1 tablet (20 mg total) by mouth 2 (two) times daily. (Patient taking differently: Take 20 mg by mouth daily. ) 60 tablet 6   gabapentin (NEURONTIN) 100 MG capsule Take 200 mg by mouth 2 (two) times daily.     levETIRAcetam (KEPPRA) 500 MG tablet Take 500 mg by mouth 2 (two) times daily.     Maltodextrin-Xanthan Gum (RESOURCE THICKENUP CLEAR) POWD Thicken liquids to honey thick texture 1  Can 6   Multiple Vitamin (MULTIVITAMIN WITH MINERALS) TABS tablet Take 1 tablet by mouth daily.     multivitamin (PROSIGHT) TABS tablet Take 1 tablet by mouth daily. 30 each 0   senna-docusate (SENOKOT-S) 8.6-50 MG tablet Take 1 tablet by mouth at bedtime as needed for mild constipation. 30 tablet 2   No current facility-administered medications for this visit.    VITAL SIGNS: There were no vitals taken for this visit. There were no vitals filed for this visit.  Estimated body mass index is 20.53 kg/m as calculated from the  following:   Height as of an earlier encounter on 10/23/19: _0  (1.727 m).   Weight as of an earlier encounter on 10/23/19: 135 lb (61.2 kg).  LABS: CBC:    Component Value Date/Time   WBC 7.8 10/23/2019 0859   HGB 12.1 (L) 10/23/2019 0859   HCT 37.1 (L) 10/23/2019 0859   PLT 298 10/23/2019 0859   MCV 78.8 (L) 10/23/2019 0859   NEUTROABS 5.6 10/23/2019 0859   LYMPHSABS 1.1 10/23/2019 0859   MONOABS 0.8 10/23/2019 0859   EOSABS 0.3 10/23/2019 0859   BASOSABS 0.0 10/23/2019 0859   Comprehensive Metabolic Panel:    Component Value Date/Time   NA 142 10/23/2019 0859   K 3.3 (L) 10/23/2019 0859   CL 102 10/23/2019 0859   CO2 29 10/23/2019 0859   BUN 10 10/23/2019 0859   CREATININE 0.84 10/23/2019 0859   GLUCOSE 135 (H) 10/23/2019 0859   CALCIUM 9.1 10/23/2019 0859   AST 31 10/23/2019 0859   ALT 22 10/23/2019 0859   ALKPHOS 76 10/23/2019 0859   BILITOT 0.4 10/23/2019 0859   PROT 7.3 10/23/2019 0859   ALBUMIN 2.9 (L) 10/23/2019 0859    RADIOGRAPHIC STUDIES: No results found.  PERFORMANCE STATUS (ECOG) : 3 - Symptomatic, >50% confined to bed  Review of Systems Unless otherwise noted, a complete review of systems is negative.  Physical Exam General: NAD, wheelchair-bound Pulmonary: Unlabored Extremities: no edema, no joint deformities Skin: no rashes Neurological: Right-sided weakness, dysarthria, alert and oriented  IMPRESSION: I met with patient following his visit with Dr. Rogue Bussing.  Patient is alert and oriented and able to engage with me readily in a conversation regarding his goals.  He says that he has been at [facility] since 2019 when he had his stroke.  At baseline, he is wheelchair-bound and requires assistance with ADLs.  He feels he is doing reasonably well and denies any recent changes or concerns.  No symptomatic complaints at present.  Patient says that his brother is involved but that he rarely sees him.  Offered to call his brother but he says that he did  not think that I would be able to reach him.  Patient says that he has no existing ACP documents.  He would want his brother to be his decision maker if necessary.  I did review ACP documents and these were sent back with him to the SNF to complete.  We discussed CODE STATUS.  Patient stated clearly that he would not want to be resuscitated or have his life prolonged artificially on machines.  I noted that he was previously intubated in 2019 when he had his stroke.  Patient now tells me that if he were at end-of-life he would prefer just to be kept comfortable.  I completed a DNR order for him to take back to the SNF.  PLAN: -Continue current scope of treatment -ACP documents reviewed -DNR/DNI -PC referral at  SNF -RTC in 2 months   Patient expressed understanding and was in agreement with this plan. He also understands that He can call the clinic at any time with any questions, concerns, or complaints.     Time Total: 30 minutes  Visit consisted of counseling and education dealing with the complex and emotionally intense issues of symptom management and palliative care in the setting of serious and potentially life-threatening illness.Greater than 50%  of this time was spent counseling and coordinating care related to the above assessment and plan.  Signed by: Altha Harm, PhD, NP-C

## 2019-10-23 NOTE — Progress Notes (Signed)
[facility] CONSULT NOTE  Patient Care Team: Alvester Morin, MD as PCP - General (Family Medicine) [name], RN as Oncology Nurse Navigator  CHIEF COMPLAINTS/PURPOSE OF CONSULTATION: LEFT LUNG MASS     Oncology History Overview Note  #April 2021-CT/PET scan left lung mass approximately 4-5cm; 12 mediastinal lymph node [initially noted May 2020-incidental/hospital]; high risk for biopsy; Dr.Gonzalez; no biopsy.  Clinically stage III lung cancer ;may/June2021- Definitive radiation  #Right sided stroke/weakness/ [wheelchair-bound/[facility] resident]; dysarthria; Hx of MVR    Primary cancer of left upper lobe of lung ([place])  09/01/2019 Initial Diagnosis   Primary cancer of left upper lobe of lung ([place])      HISTORY OF PRESENTING ILLNESS: Patient is minimally verbal because of dysarthria.  Gretta Began 64 y.o.  male history of smoking-clinical stage III non-small cell lung cancer [no biopsy] currently s/p radiation is here for follow-up.  Patient finished radiation end of June.  His appetite is doing good.  Complains of mild to moderate fatigue.  Otherwise no difficulty swallowing pain with swallowing.  He has chronic weakness of his right upper and lower extremities.  Review of Systems  Unable to perform ROS: Patient nonverbal     MEDICAL HISTORY:  Past Medical History:  Diagnosis Date  . GERD (gastroesophageal reflux disease)   . Heart disease   . HTN (hypertension)   . Hyperlipidemia   . Hypothyroid   . Infectious endocarditis 2012  . Mitral valve disease 2012    SURGICAL HISTORY: Past Surgical History:  Procedure Laterality Date  . MEDIAN STERNOTOMY     CABG? valve replacement?  Marland Kitchen MITRAL VALVE REPLACEMENT  2012    SOCIAL HISTORY: Social History   Socioeconomic History  . Marital status: Single    Spouse name: Not on file  . Number of children: Not on file  . Years of education: Not on file  . Highest education level: Not  on file  Occupational History  . Not on file  Tobacco Use  . Smoking status: Former Smoker    Quit date: 08/21/2018    Years since quitting: 1.1  . Smokeless tobacco: Never Used  Vaping Use  . Vaping Use: Never used  Substance and Sexual Activity  . Alcohol use: Not Currently  . Drug use: Not Currently  . Sexual activity: Not Currently  Other Topics Concern  . Not on file  Social History Narrative   White [employer]; used to work in [employer]; ; never married; used to smoke; no alochol.    Social Determinants of Health   Financial Resource Strain:   . Difficulty of Paying Living Expenses:   Food Insecurity:   . Worried About Charity fundraiser in the Last Year:   . Arboriculturist in the Last Year:   Transportation Needs:   . Film/video editor (Medical):   Marland Kitchen Lack of Transportation (Non-Medical):   Physical Activity:   . Days of Exercise per Week:   . Minutes of Exercise per Session:   Stress:   . Feeling of Stress :   Social Connections:   . Frequency of Communication with Friends and Family:   . Frequency of Social Gatherings with Friends and Family:   . Attends Religious Services:   . Active Member of Clubs or Organizations:   . Attends Archivist Meetings:   Marland Kitchen Marital Status:   Intimate Partner Violence:   . Fear of Current or Ex-Partner:   .  Emotionally Abused:   Marland Kitchen Physically Abused:   . Sexually Abused:     FAMILY HISTORY: No family history on file.  ALLERGIES:  has No Known Allergies.  MEDICATIONS:  Current Outpatient Medications  Medication Sig Dispense Refill  . acetaminophen (TYLENOL) 325 MG tablet Take 2 tablets (650 mg total) by mouth every 4 (four) hours as needed for mild pain (or temp > 37.5 C (99.5 F)).    Marland Kitchen amLODipine (NORVASC) 10 MG tablet Take 10 mg by mouth daily.     Marland Kitchen ammonium lactate (LAC-HYDRIN) 12 % lotion Apply 1 application topically daily.    Marland Kitchen aspirin 325 MG tablet Take 1 tablet (325 mg total) by mouth daily. 30  tablet 11  . atorvastatin (LIPITOR) 10 MG tablet Take 1 tablet (10 mg total) by mouth daily at 6 PM. 30 tablet 6  . chlorhexidine (PERIDEX) 0.12 % solution 15 mLs by Mouth Rinse route 3 (three) times daily after meals.    . citalopram (CELEXA) 10 MG tablet Take 10 mg by mouth daily.    . famotidine (PEPCID) 20 MG tablet Take 1 tablet (20 mg total) by mouth 2 (two) times daily. (Patient taking differently: Take 20 mg by mouth daily. ) 60 tablet 6  . gabapentin (NEURONTIN) 100 MG capsule Take 200 mg by mouth 2 (two) times daily.    Marland Kitchen levETIRAcetam (KEPPRA) 500 MG tablet Take 500 mg by mouth 2 (two) times daily.    . Maltodextrin-Xanthan Gum (RESOURCE THICKENUP CLEAR) POWD Thicken liquids to honey thick texture 1 Can 6  . Multiple Vitamin (MULTIVITAMIN WITH MINERALS) TABS tablet Take 1 tablet by mouth daily.    . multivitamin (PROSIGHT) TABS tablet Take 1 tablet by mouth daily. 30 each 0  . senna-docusate (SENOKOT-S) 8.6-50 MG tablet Take 1 tablet by mouth at bedtime as needed for mild constipation. 30 tablet 2   No current facility-administered medications for this visit.       Marland Kitchen  PHYSICAL EXAMINATION: ECOG PERFORMANCE STATUS: 0 - Asymptomatic  Vitals:   10/23/19 0936  BP: 94/75  Pulse: 92  Resp: 16  Temp: (!) 96.5 F (35.8 C)  SpO2: 100%   Filed Weights   10/23/19 0936  Weight: 135 lb (61.2 kg)    Physical Exam Constitutional:      Comments: Patient is wheelchair-bound.  Alone.  HENT:     Head: Normocephalic and atraumatic.     Mouth/Throat:     Pharynx: No oropharyngeal exudate.  Eyes:     Pupils: Pupils are equal, round, and reactive to light.  Cardiovascular:     Rate and Rhythm: Normal rate and regular rhythm.  Pulmonary:     Effort: No respiratory distress.     Breath sounds: No wheezing.  Abdominal:     General: Bowel sounds are normal. There is no distension.     Palpations: Abdomen is soft. There is no mass.     Tenderness: There is no abdominal tenderness.  There is no guarding or rebound.  Musculoskeletal:        General: No tenderness. Normal range of motion.     Cervical back: Normal range of motion and neck supple.  Skin:    General: Skin is warm.  Neurological:     Mental Status: He is alert and oriented to person, place, and time.     Comments: Chronic weakness of the right upper and lower extremity.  Psychiatric:        Mood and Affect: Affect normal.  LABORATORY DATA:  I have reviewed the data as listed Lab Results  Component Value Date   WBC 7.8 10/23/2019   HGB 12.1 (L) 10/23/2019   HCT 37.1 (L) 10/23/2019   MCV 78.8 (L) 10/23/2019   PLT 298 10/23/2019   Recent Labs    08/09/19 1002 09/25/19 0844 10/23/19 0859  NA 141 143 142  K 3.6 3.3* 3.3*  CL 105 106 102  CO2 27 27 29   GLUCOSE 103* 93 135*  BUN 9 10 10   CREATININE 0.88 0.89 0.84  CALCIUM 9.0 9.1 9.1  GFRNONAA >60 >60 >60  GFRAA >60 >60 >60  PROT 7.3 7.4 7.3  ALBUMIN 3.2* 3.3* 2.9*  AST 23 17 31   ALT 29 13 22   ALKPHOS 80 82 76  BILITOT 0.3 0.5 0.4    RADIOGRAPHIC STUDIES: I have personally reviewed the radiological images as listed and agreed with the findings in the report. No results found.  ASSESSMENT & PLAN:   Primary cancer of left upper lobe of lung ([place]) #Stage III -lung cancer [no biopsy] clinical non-small cell lung cancer; currently s/p definitive radiation [June 21st, 2021].  STABLE.  # will plan to get CT scan in end of Aug 2021 [2 months post treatment]  # hx of stroke/ right sided weakness chronic; mri brain-wnl. STABLE.   # # Palliative care evaluation- awaiting evaluation today.  Patient wants a DNR status.  Discussed with Josh.  #Mild anemia hemoglobin 11; mild microcytosis.-check iron studies.   # DISPOSITION: ADD IRON STUDIES/Ferritin to today labs # follow up 1st week of september- MD; labs-  Cbc/cmp; CT Chest prior--dr.B    All questions were answered. The patient knows to call the clinic with any problems,  questions or concerns.    [name], MD 10/23/2019 12:22 PM

## 2019-10-24 ENCOUNTER — Ambulatory Visit: Payer: Medicare Other

## 2019-11-11 ENCOUNTER — Ambulatory Visit: Payer: Medicare Other | Attending: Radiation Oncology | Admitting: Radiation Oncology

## 2019-12-10 ENCOUNTER — Non-Acute Institutional Stay: Payer: Medicare Other | Admitting: Nurse Practitioner

## 2019-12-10 ENCOUNTER — Other Ambulatory Visit: Payer: Self-pay

## 2019-12-10 ENCOUNTER — Non-Acute Institutional Stay: Payer: Medicare Other | Admitting: Primary Care

## 2019-12-10 DIAGNOSIS — Z515 Encounter for palliative care: Secondary | ICD-10-CM

## 2019-12-10 NOTE — Progress Notes (Signed)
Entered in error

## 2019-12-10 NOTE — Progress Notes (Signed)
[facility] Consult Note Telephone: [phone]  Fax: [phone]  PATIENT NAME: Dillon Garcia [address] [place] [address] [phone] (home)  DOB: 1955/08/28 MRN: [id number]  PRIMARY CARE PROVIDER:    Alvester Morin, MD,  [place]. [place] [address] [phone]  REFERRING PROVIDER:   Alvester Morin, MD [place]. [place],  [place] [address] [phone]  RESPONSIBLE PARTY:   Extended Emergency Contact Information Primary Emergency Contact: [place], [place] Mobile Phone: [phone] Relation: Brother  I met face to face with patient in facility.  ASSESSMENT AND RECOMMENDATIONS:   1. Advance Care Planning/Goals of Care: Goals include to maximize quality of life and symptom management. Our advance care planning conversation included a discussion about:     The value and importance of advance care planning  Exploration of personal, cultural or spiritual beliefs that might influence medical decisions   Exploration of goals of care in the event of a sudden injury or illness   Identification and preparation of a healthcare agent   Review of an  advance directive document . Patient reiterated his desire to not be resuscitated in the event of a cardiac or respiratory arrest.  2. Symptom Management: Patient on pureed diet due to dysphagia related to history of CVA. Patient expressed dis-statisfaction with his desire but report pain with chewing and bleeding gums. He is on magic mouth wash and Peridex chlorhexidine oral rinse twice a day. His had dental appointment 3 weeks ago but report not been seen due to outstanding bills. He will benefit from Ted hose for his 1+ lower extremity edema. He voiced no other concerns at this time.  3. Follow up Palliative Care Visit: Palliative care will continue to follow for goals of care clarification and symptom management. Return 8  weeks or  prn.  4. Family /Caregiver/Community Supports: Plan to review plan of care with his brother and review his goal of care  5. Cognitive / Functional decline: Patient endorsed a decline in his functional status, as he is unable to transfer independently. He currently transfer with  Sit to stand device. He is continent of urine and bladder.  I spent 46 minutes providing this consultation,  from 9:30 to 10:16. More than 50% of the time in this consultation was spent coordinating communication.    HISTORY OF PRESENT ILLNESS:  Dillon Garcia is a 64 y.o. 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 Palliative Care was asked to follow this patient by consultation request of Slade-Hartman, Ivette Loyal* to help address advance care planning and goals of care. This is an initial visit.  CODE STATUS: DNR  PPS: 50%  HOSPICE ELIGIBILITY/DIAGNOSIS: TBD  PAST MEDICAL HISTORY:  Past Medical History:  Diagnosis Date  . GERD (gastroesophageal reflux disease)   . Heart disease   . HTN (hypertension)   . Hyperlipidemia   . Hypothyroid   . Infectious endocarditis 2012  . Mitral valve disease 2012    SOCIAL HX:  Social History   Tobacco Use  . Smoking status: Former Smoker    Quit date: 08/21/2018    Years since quitting: 1.3  . Smokeless tobacco: Never Used  Substance Use Topics  . Alcohol use: Not Currently   FAMILY HX: No family history on file.  ALLERGIES: No Known Allergies   PERTINENT MEDICATIONS:  Outpatient Encounter Medications as of 12/10/2019  Medication Sig  . acetaminophen (TYLENOL)  325 MG tablet Take 2 tablets (650 mg total) by mouth every 4 (four) hours as needed for mild pain (or temp > 37.5 C (99.5 F)).  Marland Kitchen amLODipine (NORVASC) 10 MG tablet Take 10 mg by mouth daily.   Marland Kitchen ammonium lactate (LAC-HYDRIN) 12 % lotion Apply 1 application topically daily.  Marland Kitchen aspirin 325 MG tablet Take 1 tablet (325 mg  total) by mouth daily.  Marland Kitchen atorvastatin (LIPITOR) 10 MG tablet Take 1 tablet (10 mg total) by mouth daily at 6 PM.  . chlorhexidine (PERIDEX) 0.12 % solution 15 mLs by Mouth Rinse route 3 (three) times daily after meals.  . citalopram (CELEXA) 10 MG tablet Take 10 mg by mouth daily.  . famotidine (PEPCID) 20 MG tablet Take 1 tablet (20 mg total) by mouth 2 (two) times daily. (Patient taking differently: Take 20 mg by mouth daily. )  . gabapentin (NEURONTIN) 100 MG capsule Take 200 mg by mouth 2 (two) times daily.  Marland Kitchen levETIRAcetam (KEPPRA) 500 MG tablet Take 500 mg by mouth 2 (two) times daily.  . Maltodextrin-Xanthan Gum (RESOURCE THICKENUP CLEAR) POWD Thicken liquids to honey thick texture  . Multiple Vitamin (MULTIVITAMIN WITH MINERALS) TABS tablet Take 1 tablet by mouth daily.  . multivitamin (PROSIGHT) TABS tablet Take 1 tablet by mouth daily.  Marland Kitchen senna-docusate (SENOKOT-S) 8.6-50 MG tablet Take 1 tablet by mouth at bedtime as needed for mild constipation.   No facility-administered encounter medications on file as of 12/10/2019.    PHYSICAL EXAM / ROS:   Current Height/weight: 65"/149lbs  General: NAD, alert, coherent and cooperative Cardiovascular: Denied chest pain, or palpitation Pulmonary: denied cough, SOB, on room air Abdomen: appetite fair, denied constipation or diarrhea GU: denies dysuria MSK:  Wheel chair bound, denied joint or muscle pain Skin: no rashes or wounds on exposed skin Neurological: right side weakness, dysarthria  Jason Coop, NP , DNP, MPH, [facility]  COVID-19 PATIENT SCREENING TOOL  Person answering questions: ____________staff______ _____   1.  Is the patient or any family member in the home showing any signs or symptoms regarding respiratory infection?               Person with Symptom- __________NA_________________  a. Fever                                                                          Yes___ No___           ___________________  b. Shortness of breath                                                    Yes___ No___          ___________________ c. Cough/congestion                                       Yes___  No___         ___________________ d. Body aches/pains  Yes___ No___        ____________________ e. Gastrointestinal symptoms (diarrhea, nausea)           Yes___ No___        ____________________  2. Within the past 14 days, has anyone living in the home had any contact with someone with or under investigation for COVID-19?    Yes___ No_X_   Person __________________

## 2019-12-16 ENCOUNTER — Ambulatory Visit
Admission: RE | Admit: 2019-12-16 | Discharge: 2019-12-16 | Disposition: A | Discharge status: Home / Self Care | Payer: Medicare Other | Source: Ambulatory Visit | Attending: Internal Medicine | Admitting: Internal Medicine

## 2019-12-16 ENCOUNTER — Other Ambulatory Visit: Payer: Self-pay

## 2019-12-16 DIAGNOSIS — C3412 Malignant neoplasm of upper lobe, left bronchus or lung: Secondary | ICD-10-CM | POA: Diagnosis present

## 2019-12-16 HISTORY — DX: Malignant (primary) neoplasm, unspecified: C80.1

## 2019-12-16 LAB — POCT I-STAT CREATININE: Creatinine, Ser: 0.9 mg/dL (ref 0.61–1.24)

## 2019-12-16 MED ORDER — IOHEXOL 300 MG/ML  SOLN
75.0000 mL | Freq: Once | INTRAMUSCULAR | Status: AC | PRN
  Administered 2019-12-16: 75 mL via INTRAVENOUS

## 2019-12-18 ENCOUNTER — Other Ambulatory Visit: Payer: Self-pay

## 2019-12-18 ENCOUNTER — Encounter: Payer: Self-pay | Admitting: Internal Medicine

## 2019-12-18 ENCOUNTER — Inpatient Hospital Stay (HOSPITAL_BASED_OUTPATIENT_CLINIC_OR_DEPARTMENT_OTHER): Payer: Medicare Other | Admitting: Hospice and Palliative Medicine

## 2019-12-18 ENCOUNTER — Inpatient Hospital Stay: Payer: Medicare Other | Attending: Internal Medicine

## 2019-12-18 ENCOUNTER — Inpatient Hospital Stay (HOSPITAL_BASED_OUTPATIENT_CLINIC_OR_DEPARTMENT_OTHER): Payer: Medicare Other | Admitting: Internal Medicine

## 2019-12-18 VITALS — BP 111/82 | HR 82 | Temp 97.4°F | Resp 16 | Ht 68.0 in | Wt 139.0 lb

## 2019-12-18 DIAGNOSIS — Z7189 Other specified counseling: Secondary | ICD-10-CM

## 2019-12-18 DIAGNOSIS — R0609 Other forms of dyspnea: Secondary | ICD-10-CM | POA: Insufficient documentation

## 2019-12-18 DIAGNOSIS — C3412 Malignant neoplasm of upper lobe, left bronchus or lung: Secondary | ICD-10-CM | POA: Insufficient documentation

## 2019-12-18 DIAGNOSIS — R5383 Other fatigue: Secondary | ICD-10-CM | POA: Insufficient documentation

## 2019-12-18 DIAGNOSIS — Z8673 Personal history of transient ischemic attack (TIA), and cerebral infarction without residual deficits: Secondary | ICD-10-CM | POA: Insufficient documentation

## 2019-12-18 DIAGNOSIS — Z66 Do not resuscitate: Secondary | ICD-10-CM | POA: Insufficient documentation

## 2019-12-18 DIAGNOSIS — R531 Weakness: Secondary | ICD-10-CM | POA: Insufficient documentation

## 2019-12-18 DIAGNOSIS — Z515 Encounter for palliative care: Secondary | ICD-10-CM

## 2019-12-18 DIAGNOSIS — Z87891 Personal history of nicotine dependence: Secondary | ICD-10-CM | POA: Diagnosis not present

## 2019-12-18 DIAGNOSIS — Z992 Dependence on renal dialysis: Secondary | ICD-10-CM | POA: Diagnosis not present

## 2019-12-18 LAB — CBC WITH DIFFERENTIAL/PLATELET
Abs Immature Granulocytes: 0.02 10*3/uL (ref 0.00–0.07)
Basophils Absolute: 0 10*3/uL (ref 0.0–0.1)
Basophils Relative: 0 %
Eosinophils Absolute: 0.3 10*3/uL (ref 0.0–0.5)
Eosinophils Relative: 5 %
HCT: 43.2 % (ref 39.0–52.0)
Hemoglobin: 14.2 g/dL (ref 13.0–17.0)
Immature Granulocytes: 0 %
Lymphocytes Relative: 24 %
Lymphs Abs: 1.7 10*3/uL (ref 0.7–4.0)
MCH: 27 pg (ref 26.0–34.0)
MCHC: 32.9 g/dL (ref 30.0–36.0)
MCV: 82.3 fL (ref 80.0–100.0)
Monocytes Absolute: 0.6 10*3/uL (ref 0.1–1.0)
Monocytes Relative: 9 %
Neutro Abs: 4.3 10*3/uL (ref 1.7–7.7)
Neutrophils Relative %: 62 %
Platelets: 170 10*3/uL (ref 150–400)
RBC: 5.25 MIL/uL (ref 4.22–5.81)
RDW: 18.6 % — ABNORMAL HIGH (ref 11.5–15.5)
WBC: 7 10*3/uL (ref 4.0–10.5)
nRBC: 0 % (ref 0.0–0.2)

## 2019-12-18 LAB — COMPREHENSIVE METABOLIC PANEL
ALT: 26 U/L (ref 0–44)
AST: 26 U/L (ref 15–41)
Albumin: 3.8 g/dL (ref 3.5–5.0)
Alkaline Phosphatase: 77 U/L (ref 38–126)
Anion gap: 9 (ref 5–15)
BUN: 14 mg/dL (ref 8–23)
CO2: 27 mmol/L (ref 22–32)
Calcium: 8.8 mg/dL — ABNORMAL LOW (ref 8.9–10.3)
Chloride: 104 mmol/L (ref 98–111)
Creatinine, Ser: 0.87 mg/dL (ref 0.61–1.24)
GFR calc Af Amer: >60 mL/min (ref >60)
GFR calc non Af Amer: >60 mL/min (ref >60)
Glucose, Bld: 143 mg/dL — ABNORMAL HIGH (ref 70–99)
Potassium: 3.6 mmol/L (ref 3.5–5.1)
Sodium: 140 mmol/L (ref 135–145)
Total Bilirubin: 0.4 mg/dL (ref 0.3–1.2)
Total Protein: 7.1 g/dL (ref 6.5–8.1)

## 2019-12-18 NOTE — Assessment & Plan Note (Addendum)
#  Stage III -lung cancer [no biopsy] clinical non-small cell lung cancer; currently s/p definitive radiation [June 21st, 2021].  August 2021 CT scan-radiation changes noted left upper lobe otherwise no progressive malignancy or lymphadenopathy noted.  Reviewed the imaging with the patient.  Patient is not a candidate for systemic therapy at this time.  We will continue surveillance and if progression noted would recommend biopsy/systemic therapy.  # hx of stroke/ right sided weakness chronic; mri brain-wnl.  Stable  # # Palliative care evaluation- s/p evaluauation today; Patient wants a DNR status.  Discussed with Josh.  # # I offered to call the patient's borther to give an update on clinical status; patient declines.   # DISPOSITION:  # follow up in 3 months- MD; labs-  Cbc/cmp; CT Chest prior--dr.B  # I reviewed the blood work- with the patient in detail; also reviewed the imaging independently [as summarized above]; and with the patient in detail.

## 2019-12-18 NOTE — Progress Notes (Signed)
Dillon Garcia NOTE  Patient Care Team: [name], MD as PCP - General ([place]) [name], RN as Oncology Nurse Navigator [name], [name], NP as Nurse Practitioner  CHIEF COMPLAINTS/PURPOSE OF CONSULTATION: LEFT LUNG cancer.     Oncology History Overview Note  #April 2021-CT/PET scan left lung mass approximately 4-5cm; 12 mediastinal lymph node [initially noted May 2020-incidental/hospital]; high risk for biopsy; Dr.Gonzalez; no biopsy.  Clinically stage III lung cancer ;may/June2021- Definitive radiation  #Right sided stroke/weakness/ [wheelchair-bound/[facility] resident]; dysarthria; Hx of MVR    Primary cancer of left upper lobe of lung ([place])  09/01/2019 Initial Diagnosis   Primary cancer of left upper lobe of lung ([place])      HISTORY OF PRESENTING ILLNESS: Patient is minimally verbal because of dysarthria.  Bartlomiej C Chenault 64 y.o.  male history of smoking-clinical stage III non-small cell lung cancer [no biopsy] currently s/p radiation is here for follow-up/review results of the CT scan.  Patient continues to have chronic mild to moderate fatigue.  No difficulty swallowing or pain with swallowing.  Chronic mild shortness of breath not any worse.  Denies any chest pain  Chronic right-sided weakness.  Review of Systems  Unable to perform ROS: Patient nonverbal     MEDICAL HISTORY:  Past Medical History:  Diagnosis Date  . Cancer ([place])   . GERD (gastroesophageal reflux disease)   . Heart disease   . HTN (hypertension)   . Hyperlipidemia   . Hypothyroid   . Infectious endocarditis 2012  . Mitral valve disease 2012    SURGICAL HISTORY: Past Surgical History:  Procedure Laterality Date  . MEDIAN STERNOTOMY     CABG? valve replacement?  Marland Kitchen MITRAL VALVE REPLACEMENT  2012    SOCIAL HISTORY: Social History   Socioeconomic History  . Marital status: Single    Spouse name: Not on file  . Number of children: Not on  file  . Years of education: Not on file  . Highest education level: Not on file  Occupational History  . Not on file  Tobacco Use  . Smoking status: Former Smoker    Quit date: 08/21/2018    Years since quitting: 1.3  . Smokeless tobacco: Never Used  Vaping Use  . Vaping Use: Never used  Substance and Sexual Activity  . Alcohol use: Not Currently  . Drug use: Not Currently  . Sexual activity: Not Currently  Other Topics Concern  . Not on file  Social History Narrative   White [employer]; used to work in [employer]; ; never married; used to smoke; no alochol.    Social Determinants of Health   Financial Resource Strain:   . Difficulty of Paying Living Expenses: Not on file  Food Insecurity:   . Worried About Charity fundraiser in the Last Year: Not on file  . Ran Out of Food in the Last Year: Not on file  Transportation Needs:   . Lack of Transportation (Medical): Not on file  . Lack of Transportation (Non-Medical): Not on file  Physical Activity:   . Days of Exercise per Week: Not on file  . Minutes of Exercise per Session: Not on file  Stress:   . Feeling of Stress : Not on file  Social Connections:   . Frequency of Communication with Friends and Family: Not on file  . Frequency of Social Gatherings with Friends and Family: Not on file  . Attends Religious Services: Not on file  .  Active Member of Clubs or Organizations: Not on file  . Attends Archivist Meetings: Not on file  . Marital Status: Not on file  Intimate Partner Violence:   . Fear of Current or Ex-Partner: Not on file  . Emotionally Abused: Not on file  . Physically Abused: Not on file  . Sexually Abused: Not on file    FAMILY HISTORY: No family history on file.  ALLERGIES:  has No Known Allergies.  MEDICATIONS:  Current Outpatient Medications  Medication Sig Dispense Refill  . acetaminophen (TYLENOL) 325 MG tablet Take 2 tablets (650 mg total) by mouth every 4 (four) hours as needed  for mild pain (or temp > 37.5 C (99.5 F)).    Marland Kitchen amLODipine (NORVASC) 10 MG tablet Take 10 mg by mouth daily.     Marland Kitchen ammonium lactate (LAC-HYDRIN) 12 % lotion Apply 1 application topically daily.    Marland Kitchen aspirin 325 MG tablet Take 1 tablet (325 mg total) by mouth daily. 30 tablet 11  . atorvastatin (LIPITOR) 10 MG tablet Take 1 tablet (10 mg total) by mouth daily at 6 PM. 30 tablet 6  . chlorhexidine (PERIDEX) 0.12 % solution 15 mLs by Mouth Rinse route 3 (three) times daily after meals.    . citalopram (CELEXA) 10 MG tablet Take 10 mg by mouth daily.    . famotidine (PEPCID) 20 MG tablet Take 1 tablet (20 mg total) by mouth 2 (two) times daily. (Patient taking differently: Take 20 mg by mouth daily. ) 60 tablet 6  . gabapentin (NEURONTIN) 100 MG capsule Take 200 mg by mouth 2 (two) times daily.    Marland Kitchen levETIRAcetam (KEPPRA) 500 MG tablet Take 500 mg by mouth 2 (two) times daily.    . Maltodextrin-Xanthan Gum (RESOURCE THICKENUP CLEAR) POWD Thicken liquids to honey thick texture 1 Can 6  . Multiple Vitamin (MULTIVITAMIN WITH MINERALS) TABS tablet Take 1 tablet by mouth daily.    . multivitamin (PROSIGHT) TABS tablet Take 1 tablet by mouth daily. 30 each 0  . senna-docusate (SENOKOT-S) 8.6-50 MG tablet Take 1 tablet by mouth at bedtime as needed for mild constipation. 30 tablet 2   No current facility-administered medications for this visit.       Marland Kitchen  PHYSICAL EXAMINATION: ECOG PERFORMANCE STATUS: 0 - Asymptomatic  Vitals:   12/18/19 1435  BP: 111/82  Pulse: 82  Resp: 16  Temp: (!) 97.4 F (36.3 C)  SpO2: 98%   Filed Weights   12/18/19 1435  Weight: 139 lb (63 kg)    Physical Exam Constitutional:      Comments: Patient is wheelchair-bound.  Alone.  HENT:     Head: Normocephalic and atraumatic.     Mouth/Throat:     Pharynx: No oropharyngeal exudate.  Eyes:     Pupils: Pupils are equal, round, and reactive to light.  Cardiovascular:     Rate and Rhythm: Normal rate and regular  rhythm.  Pulmonary:     Effort: No respiratory distress.     Breath sounds: No wheezing.  Abdominal:     General: Bowel sounds are normal. There is no distension.     Palpations: Abdomen is soft. There is no mass.     Tenderness: There is no abdominal tenderness. There is no guarding or rebound.  Musculoskeletal:        General: No tenderness. Normal range of motion.     Cervical back: Normal range of motion and neck supple.  Skin:    General:  Skin is warm.  Neurological:     Mental Status: He is alert and oriented to person, place, and time.     Comments: Chronic weakness of the right upper and lower extremity.  Psychiatric:        Mood and Affect: Affect normal.     LABORATORY DATA:  I have reviewed the data as listed Lab Results  Component Value Date   WBC 7.0 12/18/2019   HGB 14.2 12/18/2019   HCT 43.2 12/18/2019   MCV 82.3 12/18/2019   PLT 170 12/18/2019   Recent Labs    09/25/19 0844 09/25/19 0844 10/23/19 0859 12/16/19 0956 12/18/19 1358  NA 143  --  142  --  140  K 3.3*  --  3.3*  --  3.6  CL 106  --  102  --  104  CO2 27  --  29  --  27  GLUCOSE 93  --  135*  --  143*  BUN 10  --  10  --  14  CREATININE 0.89   < > 0.84 0.90 0.87  CALCIUM 9.1  --  9.1  --  8.8*  GFRNONAA >60  --  >60  --  >60  GFRAA >60  --  >60  --  >60  PROT 7.4  --  7.3  --  7.1  ALBUMIN 3.3*  --  2.9*  --  3.8  AST 17  --  31  --  26  ALT 13  --  22  --  26  ALKPHOS 82  --  76  --  77  BILITOT 0.5  --  0.4  --  0.4   < > = values in this interval not displayed.    RADIOGRAPHIC STUDIES: I have personally reviewed the radiological images as listed and agreed with the findings in the report. CT CHEST W CONTRAST  Result Date: 12/16/2019 CLINICAL DATA:  64 year old male with history of lung cancer status post radiation therapy. Follow-up study. EXAM: CT CHEST WITH CONTRAST TECHNIQUE: Multidetector CT imaging of the chest was performed during intravenous contrast administration.  CONTRAST:  72mL OMNIPAQUE IOHEXOL 300 MG/ML  SOLN COMPARISON:  Chest CT 08/05/2019.  PET-CT 08/14/2019. FINDINGS: Cardiovascular: Heart size is normal. There is no significant pericardial fluid, thickening or pericardial calcification. There is aortic atherosclerosis, as well as atherosclerosis of the great vessels of the mediastinum and the coronary arteries, including calcified atherosclerotic plaque in the left circumflex coronary artery. Bioprosthetic mitral valve. Mediastinum/Nodes: No pathologically enlarged mediastinal or hilar lymph nodes. Esophagus is unremarkable in appearance. No axillary lymphadenopathy. Lungs/Pleura: Previously noted left upper lobe mass appears slightly smaller and more contracted when compared to the prior examination, estimated to measure approximately 5.6 x 2.9 x 2.9 cm on today's study (axial image 78 of series 3 and coronal image 68 of series 5). There are increasing areas of surrounding architectural distortion, small amount of ground-glass attenuation, and septal thickening surrounding the lesion, most compatible with evolving post radiation changes. These extend both in the left upper and left lower lobes. No other new suspicious appearing pulmonary nodules or masses are noted. No acute consolidative airspace disease. No pleural effusions. Upper Abdomen: 1.9 x 1.5 cm right adrenal nodule, unchanged. Low-attenuation lesions associated with both kidneys, compatible with cysts, incompletely imaged on today's examination. Musculoskeletal: Median sternotomy wires. There are no aggressive appearing lytic or blastic lesions noted in the visualized portions of the skeleton. IMPRESSION: 1. Today's study demonstrates a positive response to therapy  with slight regression of left upper lobe mass, with evolving postradiation changes surrounding lesion in the left lung, as above. 2. Stable 1.9 x 1.5 cm right adrenal nodule, similar to prior examinations, incompletely characterized on today's  examination, but favored to represent an adenoma. 3. Aortic atherosclerosis, in addition to left circumflex coronary artery disease. Please note that although the presence of coronary artery calcium documents the presence of coronary artery disease, the severity of this disease and any potential stenosis cannot be assessed on this non-gated CT examination. Assessment for potential risk factor modification, dietary therapy or pharmacologic therapy may be warranted, if clinically indicated. 4. Additional incidental findings, as above. Aortic Atherosclerosis (ICD10-I70.0). Electronically Signed   By: Vinnie Langton M.D.   On: 12/16/2019 10:24    ASSESSMENT & PLAN:   Primary cancer of left upper lobe of lung ([place]) #Stage III -lung cancer [no biopsy] clinical non-small cell lung cancer; currently s/p definitive radiation [June 21st, 2021].  August 2021 CT scan-radiation changes noted left upper lobe otherwise no progressive malignancy or lymphadenopathy noted.  Reviewed the imaging with the patient.  Patient is not a candidate for systemic therapy at this time.  We will continue surveillance and if progression noted would recommend biopsy/systemic therapy.  # hx of stroke/ right sided weakness chronic; mri brain-wnl.  Stable  # # Palliative care evaluation- s/p evaluauation today; Patient wants a DNR status.  Discussed with Josh.  # # I offered to call the patient's borther to give an update on clinical status; patient declines.   # DISPOSITION:  # follow up in 3 months- MD; labs-  Cbc/cmp; CT Chest prior--dr.B  # I reviewed the blood work- with the patient in detail; also reviewed the imaging independently [as summarized above]; and with the patient in detail.      All questions were answered. The patient knows to call the clinic with any problems, questions or concerns.    [name], MD 01/05/2020 7:04 PM

## 2019-12-18 NOTE — Progress Notes (Signed)
[facility]  Telephone:([phone] [phone]   Name: Dillon Garcia Date: 12/18/2019 MRN: [id number]  DOB: Dec 04, 1955  Patient Care Team: Alvester Morin, MD as PCP - General (Family Medicine) [name], RN as Oncology Nurse Navigator [name], [name], NP as Nurse Practitioner    REASON FOR CONSULTATION: Dillon Garcia is a 64 y.o. 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  Patient is a resident of an SNF.  He is wheelchair-bound at baseline.  Palliative care was consulted to help address goals.  SOCIAL HISTORY:     reports that he quit smoking about 15 months ago. He has never used smokeless tobacco. He reports previous alcohol use. He reports previous drug use.   He is not married.  He has no children.  He is a long-term resident at [facility].  Patient has a brother who is involved in his care.  ADVANCE DIRECTIVES:  Does not have  CODE STATUS: DNR/DNI (DNR form completed on 10/23/2019)  PAST MEDICAL HISTORY: Past Medical History:  Diagnosis Date   Cancer ([place])    GERD (gastroesophageal reflux disease)    Heart disease    HTN (hypertension)    Hyperlipidemia    Hypothyroid    Infectious endocarditis 2012   Mitral valve disease 2012    PAST SURGICAL HISTORY:  Past Surgical History:  Procedure Laterality Date   MEDIAN STERNOTOMY     CABG? valve replacement?   MITRAL VALVE REPLACEMENT  2012    HEMATOLOGY/ONCOLOGY HISTORY:  Oncology History Overview Note  #April 2021-CT/PET scan left lung mass approximately 4-5cm; 12 mediastinal lymph node [initially noted May 2020-incidental/hospital]; high risk for biopsy; Dr.Gonzalez; no biopsy.  Clinically stage III lung cancer ;may/June2021- Definitive  radiation  #Right sided stroke/weakness/ [wheelchair-bound/[facility] resident]; dysarthria; Hx of MVR    Primary cancer of left upper lobe of lung ([place])  09/01/2019 Initial Diagnosis   Primary cancer of left upper lobe of lung ([facility])     ALLERGIES:  has No Known Allergies.  MEDICATIONS:  Current Outpatient Medications  Medication Sig Dispense Refill   acetaminophen (TYLENOL) 325 MG tablet Take 2 tablets (650 mg total) by mouth every 4 (four) hours as needed for mild pain (or temp > 37.5 C (99.5 F)).     amLODipine (NORVASC) 10 MG tablet Take 10 mg by mouth daily.      ammonium lactate (LAC-HYDRIN) 12 % lotion Apply 1 application topically daily.     aspirin 325 MG tablet Take 1 tablet (325 mg total) by mouth daily. 30 tablet 11   atorvastatin (LIPITOR) 10 MG tablet Take 1 tablet (10 mg total) by mouth daily at 6 PM. 30 tablet 6   chlorhexidine (PERIDEX) 0.12 % solution 15 mLs by Mouth Rinse route 3 (three) times daily after meals.     citalopram (CELEXA) 10 MG tablet Take 10 mg by mouth daily.     famotidine (PEPCID) 20 MG tablet Take 1 tablet (20 mg total) by mouth 2 (two) times daily. (Patient taking differently: Take 20 mg by mouth daily. ) 60 tablet 6   gabapentin (NEURONTIN) 100 MG capsule Take 200 mg by mouth 2 (two) times daily.     levETIRAcetam (KEPPRA) 500 MG tablet Take 500 mg by mouth 2 (two) times daily.     Maltodextrin-Xanthan  Gum (RESOURCE THICKENUP CLEAR) POWD Thicken liquids to honey thick texture 1 Can 6   Multiple Vitamin (MULTIVITAMIN WITH MINERALS) TABS tablet Take 1 tablet by mouth daily.     multivitamin (PROSIGHT) TABS tablet Take 1 tablet by mouth daily. 30 each 0   senna-docusate (SENOKOT-S) 8.6-50 MG tablet Take 1 tablet by mouth at bedtime as needed for mild constipation. 30 tablet 2   No current facility-administered medications for this visit.    VITAL SIGNS: There were no vitals taken for this visit. There were no vitals filed for  this visit.  Estimated body mass index is 21.13 kg/m as calculated from the following:   Height as of an earlier encounter on 12/18/19: 5\' 8"  (1.727 m).   Weight as of an earlier encounter on 12/18/19: 139 lb (63 kg).  LABS: CBC:    Component Value Date/Time   WBC 7.0 12/18/2019 1358   HGB 14.2 12/18/2019 1358   HCT 43.2 12/18/2019 1358   PLT 170 12/18/2019 1358   MCV 82.3 12/18/2019 1358   NEUTROABS 4.3 12/18/2019 1358   LYMPHSABS 1.7 12/18/2019 1358   MONOABS 0.6 12/18/2019 1358   EOSABS 0.3 12/18/2019 1358   BASOSABS 0.0 12/18/2019 1358   Comprehensive Metabolic Panel:    Component Value Date/Time   NA 140 12/18/2019 1358   K 3.6 12/18/2019 1358   CL 104 12/18/2019 1358   CO2 27 12/18/2019 1358   BUN 14 12/18/2019 1358   CREATININE 0.87 12/18/2019 1358   GLUCOSE 143 (H) 12/18/2019 1358   CALCIUM 8.8 (L) 12/18/2019 1358   AST 26 12/18/2019 1358   ALT 26 12/18/2019 1358   ALKPHOS 77 12/18/2019 1358   BILITOT 0.4 12/18/2019 1358   PROT 7.1 12/18/2019 1358   ALBUMIN 3.8 12/18/2019 1358    RADIOGRAPHIC STUDIES: CT CHEST W CONTRAST  Result Date: 12/16/2019 CLINICAL DATA:  64 year old male with history of lung cancer status post radiation therapy. Follow-up study. EXAM: CT CHEST WITH CONTRAST TECHNIQUE: Multidetector CT imaging of the chest was performed during intravenous contrast administration. CONTRAST:  36mL OMNIPAQUE IOHEXOL 300 MG/ML  SOLN COMPARISON:  Chest CT 08/05/2019.  PET-CT 08/14/2019. FINDINGS: Cardiovascular: Heart size is normal. There is no significant pericardial fluid, thickening or pericardial calcification. There is aortic atherosclerosis, as well as atherosclerosis of the great vessels of the mediastinum and the coronary arteries, including calcified atherosclerotic plaque in the left circumflex coronary artery. Bioprosthetic mitral valve. Mediastinum/Nodes: No pathologically enlarged mediastinal or hilar lymph nodes. Esophagus is unremarkable in appearance.  No axillary lymphadenopathy. Lungs/Pleura: Previously noted left upper lobe mass appears slightly smaller and more contracted when compared to the prior examination, estimated to measure approximately 5.6 x 2.9 x 2.9 cm on today's study (axial image 78 of series 3 and coronal image 68 of series 5). There are increasing areas of surrounding architectural distortion, small amount of ground-glass attenuation, and septal thickening surrounding the lesion, most compatible with evolving post radiation changes. These extend both in the left upper and left lower lobes. No other new suspicious appearing pulmonary nodules or masses are noted. No acute consolidative airspace disease. No pleural effusions. Upper Abdomen: 1.9 x 1.5 cm right adrenal nodule, unchanged. Low-attenuation lesions associated with both kidneys, compatible with cysts, incompletely imaged on today's examination. Musculoskeletal: Median sternotomy wires. There are no aggressive appearing lytic or blastic lesions noted in the visualized portions of the skeleton. IMPRESSION: 1. Today's study demonstrates a positive response to therapy with slight regression of left upper lobe mass,  with evolving postradiation changes surrounding lesion in the left lung, as above. 2. Stable 1.9 x 1.5 cm right adrenal nodule, similar to prior examinations, incompletely characterized on today's examination, but favored to represent an adenoma. 3. Aortic atherosclerosis, in addition to left circumflex coronary artery disease. Please note that although the presence of coronary artery calcium documents the presence of coronary artery disease, the severity of this disease and any potential stenosis cannot be assessed on this non-gated CT examination. Assessment for potential risk factor modification, dietary therapy or pharmacologic therapy may be warranted, if clinically indicated. 4. Additional incidental findings, as above. Aortic Atherosclerosis (ICD10-I70.0). Electronically  Signed   By: Vinnie Langton M.D.   On: 12/16/2019 10:24    PERFORMANCE STATUS (ECOG) : 3 - Symptomatic, >50% confined to bed  Review of Systems Unless otherwise noted, a complete review of systems is negative.  Physical Exam General: NAD, wheelchair-bound Pulmonary: Unlabored Extremities: no edema, no joint deformities Skin: no rashes Neurological: Right-sided weakness, dysarthria, alert and oriented  IMPRESSION: Routine follow-up visit.  CT of the chest on 8/30 reveals interval improvement in left upper lobe mass.  Plan is for surveillance.  Patient reports that he is overall doing well.  He denies any significant changes or concerns today.  No symptomatic complaints at present.  Patient continues to reside at [facility].  I note that patient remains a full code on SNF paperwork despite previously sending him back to the facility with a DNR order.  I readdressed CODE STATUS today and patient again confirmed that he would not want to be resuscitated nor have his life prolonged artificially on machines.  He again stated that he wanted to be a DNR/DNI.  I signed another DNR order and sent back to the facility.  I sent a message to Ralene Bathe, NP at facility to help coordinate code status change.   PLAN: -Continue current scope of treatment -DNR/DNI -RTC in 1-2 months  Case and plan discussed with Dr. Rogue Bussing   Patient expressed understanding and was in agreement with this plan. He also understands that He can call the clinic at any time with any questions, concerns, or complaints.     Time Total: 15 minutes  Visit consisted of counseling and education dealing with the complex and emotionally intense issues of symptom management and palliative care in the setting of serious and potentially life-threatening illness.Greater than 50%  of this time was spent counseling and coordinating care related to the above assessment and plan.  Signed by: Altha Harm, PhD, NP-C

## 2019-12-20 ENCOUNTER — Non-Acute Institutional Stay: Payer: Medicare Other | Admitting: Primary Care

## 2019-12-20 ENCOUNTER — Other Ambulatory Visit: Payer: Self-pay

## 2019-12-20 DIAGNOSIS — Z515 Encounter for palliative care: Secondary | ICD-10-CM

## 2019-12-20 DIAGNOSIS — I69391 Dysphagia following cerebral infarction: Secondary | ICD-10-CM

## 2019-12-20 DIAGNOSIS — C3412 Malignant neoplasm of upper lobe, left bronchus or lung: Secondary | ICD-10-CM

## 2019-12-20 DIAGNOSIS — I639 Cerebral infarction, unspecified: Secondary | ICD-10-CM

## 2019-12-20 NOTE — Progress Notes (Signed)
[facility] Pall[facility] Consult Note Telephone: [phone]  Fax: [phone]  PATIENT NAME: Dillon Garcia [address] [place] [address] [phone] (home)  DOB: 12-09-1955 MRN: [id number]  PRIMARY CARE PROVIDER:    [name], MD,  [place] [address] [place]:   [name], [place],  [place] [address] [phone]  RESPONSIBLE PARTY:   Extended Emergency Contact Information Primary Emergency Contact: Kengo, [place] Mobile Phone: [phone] Relation: Brother  I met face to face with patient in the facility.  ASSESSMENT AND RECOMMENDATIONS:   1. Advance Care Planning/Goals of Care: Goals include to maximize quality of life and symptom management. Our advance care planning conversation included a discussion about:     The value and importance of advance care planning   Exploration of goals of care in the event of a sudden injury or illness   Identification of a healthcare agent   Review and updating of an  advance directive document .  Cancer center concerned DNR was in facility per pt's request. DNR is on file but staff states it must be signed by their medical team. Reviewed patient wishes with staff, and asked them to f/u ASAP with getting an acceptable version. Staff states they can activate a 72 hour DNR and f/u with new MD for signatures.  I discussed advance directive with patient who reiterates the DNR request.   2. Symptom Management:  He also states the SNF has gotten him a foot rest and a cushion is on order for his seat, for comfort measures.  3. Follow up Palliative Care Visit: Palliative care will continue to follow for goals of care clarification and symptom management. Return 4-6 weeks or prn.  4. Family /Caregiver/Community Supports: Brother is POA, pt is making own decisions now. Requests no call to brother at this time. Lives in [place].  5.  Cognitive / Functional decline: A and O x 3, has capacity to make decisions, w/c bound.  I spent 25 minutes providing this consultation,  from 0930 to 0955. More than 50% of the time in this consultation was spent coordinating communication.   CHIEF COMPLAINT:  HISTORY OF PRESENT ILLNESS:  Dillon Garcia is a 64 y.o. 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 Palliative Care was asked to follow this patient by consultation request of [name], MD to help address advance care planning and goals of care. This is a follow up visit.  CODE STATUS: DNR  PPS: 50%  HOSPICE ELIGIBILITY/DIAGNOSIS: TBD  PAST MEDICAL HISTORY:  Past Medical History:  Diagnosis Date  . Cancer ([facility])   . GERD (gastroesophageal reflux disease)   . Heart disease   . HTN (hypertension)   . Hyperlipidemia   . Hypothyroid   . Infectious endocarditis 2012  . Mitral valve disease 2012    SOCIAL HX:  Social History   Tobacco Use  . Smoking status: Former Smoker    Quit date: 08/21/2018    Years since quitting: 1.3  . Smokeless tobacco: Never Used  Substance Use Topics  . Alcohol use: Not Currently   FAMILY HX: No family history on file.  ALLERGIES: No Known Allergies   PERTINENT MEDICATIONS:  Outpatient Encounter Medications as of 12/20/2019  Medication Sig  . acetaminophen (TYLENOL) 325 MG tablet Take 2 tablets (650 mg total) by mouth every 4 (four) hours as  needed for mild pain (or temp > 37.5 C (99.5 F)).  Marland Kitchen amLODipine (NORVASC) 10 MG tablet Take 10 mg by mouth daily.   Marland Kitchen ammonium lactate (LAC-HYDRIN) 12 % lotion Apply 1 application topically daily.  Marland Kitchen aspirin 325 MG tablet Take 1 tablet (325 mg total) by mouth daily.  Marland Kitchen atorvastatin (LIPITOR) 10 MG tablet Take 1 tablet (10 mg total) by mouth daily at 6 PM.  . chlorhexidine (PERIDEX) 0.12 % solution 15 mLs by Mouth Rinse route 3 (three) times daily after meals.  .  citalopram (CELEXA) 10 MG tablet Take 10 mg by mouth daily.  . famotidine (PEPCID) 20 MG tablet Take 1 tablet (20 mg total) by mouth 2 (two) times daily. (Patient taking differently: Take 20 mg by mouth daily. )  . gabapentin (NEURONTIN) 100 MG capsule Take 200 mg by mouth 2 (two) times daily.  Marland Kitchen levETIRAcetam (KEPPRA) 500 MG tablet Take 500 mg by mouth 2 (two) times daily.  . Maltodextrin-Xanthan Gum (RESOURCE THICKENUP CLEAR) POWD Thicken liquids to honey thick texture  . Multiple Vitamin (MULTIVITAMIN WITH MINERALS) TABS tablet Take 1 tablet by mouth daily.  . multivitamin (PROSIGHT) TABS tablet Take 1 tablet by mouth daily.  Marland Kitchen senna-docusate (SENOKOT-S) 8.6-50 MG tablet Take 1 tablet by mouth at bedtime as needed for mild constipation.   No facility-administered encounter medications on file as of 12/20/2019.    PHYSICAL EXAM / ROS:   Deferred  Jason Coop, NP , DNP, MPH, [facility]  COVID-19 PATIENT SCREENING TOOL  Person answering questions: ____________staff______ _____   1.  Is the patient or any family member in the home showing any signs or symptoms regarding respiratory infection?               Person with Symptom- __________NA_________________  a. Fever                                                                          Yes___ No___          ___________________  b. Shortness of breath                                                    Yes___ No___          ___________________ c. Cough/congestion                                       Yes___  No___         ___________________ d. Body aches/pains                                                         Yes___ No___        ____________________ e. Gastrointestinal symptoms (diarrhea, nausea)           Yes___  No___        ____________________  2. Within the past 14 days, has anyone living in the home had any contact with someone with or under investigation for COVID-19?    Yes___ No_X_   Person __________________

## 2020-01-24 ENCOUNTER — Non-Acute Institutional Stay: Payer: Medicare Other | Admitting: Primary Care

## 2020-01-24 ENCOUNTER — Other Ambulatory Visit: Payer: Self-pay

## 2020-01-24 DIAGNOSIS — C3412 Malignant neoplasm of upper lobe, left bronchus or lung: Secondary | ICD-10-CM

## 2020-01-24 DIAGNOSIS — I69391 Dysphagia following cerebral infarction: Secondary | ICD-10-CM

## 2020-01-24 DIAGNOSIS — I639 Cerebral infarction, unspecified: Secondary | ICD-10-CM

## 2020-01-24 DIAGNOSIS — Z515 Encounter for palliative care: Secondary | ICD-10-CM

## 2020-01-24 NOTE — Progress Notes (Signed)
° ° °[facility] °Community Palliative Care Consult Note °Telephone: [phone]  °Fax: [phone] ° °PATIENT NAME: Dillon Garcia °[address] °[place] [address] °[phone] (home)  °DOB: 12/16/1955 °MRN: [id number] ° °PRIMARY CARE PROVIDER:    °Ladak, Shenif, MD,  °[address] °[place] [address] °[phone] ° °REFERRING PROVIDER:   °Ladak, Shenif, MD °[address] °[place],  [place] [address] °[phone] ° °RESPONSIBLE PARTY:   Extended Emergency Contact Information °Primary Emergency Contact: Crissman, Danny °Mobile Phone: [phone] °Relation: Brother ° °I met face to face with patient in facility. ° ° °ASSESSMENT AND RECOMMENDATIONS:  ° °1. Advance Care Planning/Goals of Care: Goals include to maximize quality of life and symptom management.DNR on file.  ° °2. Symptom Management:  ° °I visited with Dillon Garcia in his nursing home room. He’s up in his chair and is dressed for the day. ° °Nutrition:  He states that his food it’s OK but that his taste is off. He has extensive stomatitis which is being treated with several preparations, chlorhexidine and magic mouthwash. I would recommend possible trial of PPI and probiotics. He endorses that he has dysgeusia and his gums are uncomfortable. His weight is  stable in the past few months but his baseline a year  ago was 20 pounds more. I would recommend dietary supplements such as my shakes med pass etc. to magic cup to address this weight declines. ° °Pain: Denies pain,  Has prn acetaminophen.He reports that he feels good no pain and he has been working on therapy specifically speech therapy for speaking and swallowing ° °Covid 19: Needs 3rd injection, will advocate for it soon. Education provided for flu injection as well.  ° °Mobility: Can mobilize for therapy to the PT room. Likes to go to courtyard as well in good weather. ° °3. Follow up Palliative Care Visit: Palliative care will continue to follow for goals of care  clarification and symptom management. Return 4 weeks or prn. ° °4. Family /Caregiver/Community Supports: Has brother who is poa but making own decisions at this time. Lives in LTC. ° °5. Cognitive / Functional decline: A and O x 3, needs help with adls, mobility d/t CVA. ° °I spent 35 minutes providing this consultation,  from 1000 to 1035. More than 50% of the time in this consultation was spent coordinating communication.  ° °CHIEF COMPLAINT: dysgeusia ° ° °HISTORY OF PRESENT ILLNESS:  Dillon Garcia is a 64 y.o. year old male with cancer, old cva with deficits, dysgeusia. This is impacting his ability to enjoy food and therefore gain weight. He lost 20 lbs in a year. He is currently using chlorhexidine and magic mouthwash with limited benefit. He is currently participating in ST.We are asked to consult around expected decline from new cancer dx.   ° °Palliative Care was asked to follow this patient by consultation request of Ladak, Shenif, MD to help address advance care planning and goals of care. This is a follow up visit. ° °CODE STATUS: DNR ° °PPS: 40% ° °HOSPICE ELIGIBILITY/DIAGNOSIS: TBD ° °ROS ° °General: NAD °EYES: denies vision changes °ENMT: denies dysphagia, endorses dysgeusia °Cardiovascular: denies chest pain °Pulmonary: denies  cough, denies increased SOB °Abdomen: endorses appetite fair, denies constipation, endorses continence of bowel °GU: denies dysuria, endorses continence of urine °MSK:  endorses ROM limitations, no falls reported °Skin: denies rashes or wounds °Neurological: endorses weakness, denies pain, denies insomnia °Psych: Endorses positive mood ° °Physical Exam: °Current and past weights:149.4 lbs, 20   lb loss in 1 year ( 12%) °Constitutional: NAD °General :frail appearing, thin °EYES: anicteric sclera,EOMI, lids intact, no discharge  °ENMT: intact hearing,oral mucous membranes moist, dentition intact °CV:  no LE edema °Pulmonary no increased work of breathing, no cough, no audible  wheezes, room air °Abdomen: normoactive BS +  4 quadrants, soft and non tender, no ascites °GU: deferred °MSK: mild sacropenia, decreased ROM in all extremities, Right hemiplegia, non ambulatory °Skin: warm and dry, no rashes or wounds on visible skin °Neuro: Weakness, grossly R hemiplegia from old CVA °Psych: non anxious affective, A and O x 3 ° °CURRENT PROBLEM LIST:  °Patient Active Problem List  ° Diagnosis Date Noted  °• Primary cancer of left upper lobe of lung (HCC) 09/01/2019  °• Goals of care, counseling/discussion 09/01/2019  °• Mass of upper lobe of left lung 08/09/2019  °• GERD (gastroesophageal reflux disease) 09/12/2018  °• Hyperlipidemia 09/12/2018  °• 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 09/12/2018  °• Acute respiratory failure with hypoxia (HCC) 09/12/2018  °• COVID-19 virus infection 09/12/2018  °• Acute respiratory disease due to COVID-19 virus 09/12/2018  °• Aspiration into airway   °• Benign essential HTN   °• Tobacco abuse   °• Seizures (HCC)   °• Tachypnea   °• Bradycardia   °• Diastolic dysfunction   °• Hypernatremia   °• Malnutrition of moderate degree 09/07/2017  °• Encounter for orogastric (OG) tube placement   °• Cerebral edema (HCC) 09/05/2017  °• Stroke (cerebrum) (HCC) 09/04/2017  °• Acute ischemic stroke (HCC) 09/04/2017  ° °PAST MEDICAL HISTORY:  °Past Medical History:  °Diagnosis Date  °• Cancer (HCC)   °• GERD (gastroesophageal reflux disease)   °• Heart disease   °• HTN (hypertension)   °• Hyperlipidemia   °• Hypothyroid   °• Infectious endocarditis 2012  °• Mitral valve disease 2012  °  °SOCIAL HX:  °Social History  ° °Tobacco Use  °• Smoking status: Former Smoker  °  Quit date: 08/21/2018  °  Years since quitting: 1.4  °• Smokeless tobacco: Never Used  °Substance Use Topics  °• Alcohol use: Not Currently  ° °FAMILY HX: No family history on file. ° °ALLERGIES: No Known Allergies   °PERTINENT MEDICATIONS:  °Outpatient Encounter Medications as of 01/24/2020  °Medication Sig  °• acetaminophen  (TYLENOL) 325 MG tablet Take 2 tablets (650 mg total) by mouth every 4 (four) hours as needed for mild pain (or temp > 37.5 C (99.5 F)). (Patient taking differently: Take 650 mg by mouth every 6 (six) hours as needed for mild pain (or temp > 37.5 C (99.5 F)). )  °• amLODipine (NORVASC) 10 MG tablet Take 10 mg by mouth daily.   °• ammonium lactate (LAC-HYDRIN) 12 % lotion Apply 1 application topically daily.  °• aspirin 325 MG tablet Take 1 tablet (325 mg total) by mouth daily.  °• atorvastatin (LIPITOR) 10 MG tablet Take 1 tablet (10 mg total) by mouth daily at 6 PM.  °• chlorhexidine (PERIDEX) 0.12 % solution 15 mLs by Mouth Rinse route 3 (three) times daily after meals.  °• citalopram (CELEXA) 10 MG tablet Take 10 mg by mouth daily.  °• docusate sodium (COLACE) 100 MG capsule Take 100 mg by mouth daily.  °• famotidine (PEPCID) 20 MG tablet Take 1 tablet (20 mg total) by mouth 2 (two) times daily.  °• gabapentin (NEURONTIN) 100 MG capsule Take 200 mg by mouth 2 (two) times daily.  °• KETOCONAZOLE, TOPICAL, (NIZORAL A-D) 1 % SHAM   Apply 1 application topically 2 (two) times a week.  °• levETIRAcetam (KEPPRA) 500 MG tablet Take 500 mg by mouth 2 (two) times daily.  °• magic mouthwash SOLN Take 5 mLs by mouth 3 (three) times daily. Before meals  °• multivitamin (PROSIGHT) TABS tablet Take 1 tablet by mouth daily.  °• Vitamins A & D (VITAMIN A & D) ointment Apply 1 application topically 2 (two) times daily as needed for dry skin.  °• zinc oxide 20 % ointment Apply 1 application topically 2 (two) times daily as needed for irritation.  °• Maltodextrin-Xanthan Gum (RESOURCE THICKENUP CLEAR) POWD Thicken liquids to honey thick texture  °• [DISCONTINUED] Multiple Vitamin (MULTIVITAMIN WITH MINERALS) TABS tablet Take 1 tablet by mouth daily.  °• [DISCONTINUED] senna-docusate (SENOKOT-S) 8.6-50 MG tablet Take 1 tablet by mouth at bedtime as needed for mild constipation.  ° °No facility-administered encounter medications on  file as of 01/24/2020.  ° ° ° °Kathryn McKelvey Smith, NP , DNP, MPH, AGPCNP-BC, ACHPN ° °COVID-19 PATIENT SCREENING TOOL ° °Person answering questions: ____________staff_____ _____ °  °1.  Is the patient or any family member in the home showing any signs or symptoms regarding respiratory infection? ° °             Person with Symptom- __________NA_________________ ° °a. Fever                                                                          Yes___ No___          ___________________  °b. Shortness of breath                                                    Yes___ No___          ___________________ °c. Cough/congestion                                       Yes___  No___         ___________________ °d. Body aches/pains                                                         Yes___ No___        ____________________ °e. Gastrointestinal symptoms (diarrhea, nausea)           Yes___ No___        ____________________ ° °2. Within the past 14 days, has anyone living in the home had any contact with someone with or under investigation for COVID-19?    °Yes___ No_X_   Person __________________ ° ° °

## 2020-03-20 ENCOUNTER — Ambulatory Visit
Admission: RE | Admit: 2020-03-20 | Discharge: 2020-03-20 | Disposition: A | Discharge status: Home / Self Care | Payer: Medicare Other | Source: Ambulatory Visit | Attending: Internal Medicine | Admitting: Internal Medicine

## 2020-03-20 ENCOUNTER — Other Ambulatory Visit: Payer: Self-pay

## 2020-03-20 DIAGNOSIS — C3412 Malignant neoplasm of upper lobe, left bronchus or lung: Secondary | ICD-10-CM | POA: Diagnosis not present

## 2020-03-20 LAB — POCT I-STAT CREATININE: Creatinine, Ser: 1 mg/dL (ref 0.61–1.24)

## 2020-03-20 MED ORDER — IOHEXOL 300 MG/ML  SOLN
75.0000 mL | Freq: Once | INTRAMUSCULAR | Status: AC | PRN
  Administered 2020-03-20: 75 mL via INTRAVENOUS

## 2020-03-23 ENCOUNTER — Other Ambulatory Visit: Payer: Self-pay

## 2020-03-23 ENCOUNTER — Inpatient Hospital Stay: Payer: Medicare Other | Attending: Internal Medicine

## 2020-03-23 ENCOUNTER — Encounter: Payer: Self-pay | Admitting: Internal Medicine

## 2020-03-23 ENCOUNTER — Inpatient Hospital Stay (HOSPITAL_BASED_OUTPATIENT_CLINIC_OR_DEPARTMENT_OTHER): Payer: Medicare Other | Admitting: Internal Medicine

## 2020-03-23 ENCOUNTER — Encounter (INDEPENDENT_AMBULATORY_CARE_PROVIDER_SITE_OTHER): Payer: Self-pay

## 2020-03-23 ENCOUNTER — Inpatient Hospital Stay (HOSPITAL_BASED_OUTPATIENT_CLINIC_OR_DEPARTMENT_OTHER): Payer: Medicare Other | Admitting: Hospice and Palliative Medicine

## 2020-03-23 VITALS — BP 122/82 | HR 87 | Temp 97.6°F | Resp 18 | Wt 157.8 lb

## 2020-03-23 DIAGNOSIS — Z79899 Other long term (current) drug therapy: Secondary | ICD-10-CM | POA: Insufficient documentation

## 2020-03-23 DIAGNOSIS — Z7982 Long term (current) use of aspirin: Secondary | ICD-10-CM | POA: Diagnosis not present

## 2020-03-23 DIAGNOSIS — C3412 Malignant neoplasm of upper lobe, left bronchus or lung: Secondary | ICD-10-CM

## 2020-03-23 DIAGNOSIS — J439 Emphysema, unspecified: Secondary | ICD-10-CM | POA: Diagnosis not present

## 2020-03-23 DIAGNOSIS — E785 Hyperlipidemia, unspecified: Secondary | ICD-10-CM | POA: Diagnosis not present

## 2020-03-23 DIAGNOSIS — Z87891 Personal history of nicotine dependence: Secondary | ICD-10-CM | POA: Diagnosis not present

## 2020-03-23 DIAGNOSIS — I10 Essential (primary) hypertension: Secondary | ICD-10-CM | POA: Insufficient documentation

## 2020-03-23 DIAGNOSIS — Z66 Do not resuscitate: Secondary | ICD-10-CM | POA: Diagnosis not present

## 2020-03-23 DIAGNOSIS — E039 Hypothyroidism, unspecified: Secondary | ICD-10-CM | POA: Diagnosis not present

## 2020-03-23 DIAGNOSIS — K219 Gastro-esophageal reflux disease without esophagitis: Secondary | ICD-10-CM | POA: Insufficient documentation

## 2020-03-23 DIAGNOSIS — Z8673 Personal history of transient ischemic attack (TIA), and cerebral infarction without residual deficits: Secondary | ICD-10-CM | POA: Insufficient documentation

## 2020-03-23 DIAGNOSIS — R531 Weakness: Secondary | ICD-10-CM | POA: Diagnosis not present

## 2020-03-23 DIAGNOSIS — Z515 Encounter for palliative care: Secondary | ICD-10-CM

## 2020-03-23 LAB — COMPREHENSIVE METABOLIC PANEL
ALT: 25 U/L (ref 0–44)
AST: 23 U/L (ref 15–41)
Albumin: 3.8 g/dL (ref 3.5–5.0)
Alkaline Phosphatase: 77 U/L (ref 38–126)
Anion gap: 11 (ref 5–15)
BUN: 19 mg/dL (ref 8–23)
CO2: 29 mmol/L (ref 22–32)
Calcium: 9.2 mg/dL (ref 8.9–10.3)
Chloride: 101 mmol/L (ref 98–111)
Creatinine, Ser: 0.91 mg/dL (ref 0.61–1.24)
GFR, Estimated: >60 mL/min (ref >60)
Glucose, Bld: 140 mg/dL — ABNORMAL HIGH (ref 70–99)
Potassium: 3.5 mmol/L (ref 3.5–5.1)
Sodium: 141 mmol/L (ref 135–145)
Total Bilirubin: 0.5 mg/dL (ref 0.3–1.2)
Total Protein: 6.9 g/dL (ref 6.5–8.1)

## 2020-03-23 LAB — CBC WITH DIFFERENTIAL/PLATELET
Abs Immature Granulocytes: 0.02 10*3/uL (ref 0.00–0.07)
Basophils Absolute: 0 10*3/uL (ref 0.0–0.1)
Basophils Relative: 1 %
Eosinophils Absolute: 0.4 10*3/uL (ref 0.0–0.5)
Eosinophils Relative: 5 %
HCT: 48.1 % (ref 39.0–52.0)
Hemoglobin: 16.1 g/dL (ref 13.0–17.0)
Immature Granulocytes: 0 %
Lymphocytes Relative: 25 %
Lymphs Abs: 2 10*3/uL (ref 0.7–4.0)
MCH: 28.1 pg (ref 26.0–34.0)
MCHC: 33.5 g/dL (ref 30.0–36.0)
MCV: 83.9 fL (ref 80.0–100.0)
Monocytes Absolute: 0.7 10*3/uL (ref 0.1–1.0)
Monocytes Relative: 9 %
Neutro Abs: 4.8 10*3/uL (ref 1.7–7.7)
Neutrophils Relative %: 60 %
Platelets: 85 10*3/uL — ABNORMAL LOW (ref 150–400)
RBC: 5.73 MIL/uL (ref 4.22–5.81)
RDW: 14 % (ref 11.5–15.5)
WBC: 8 10*3/uL (ref 4.0–10.5)
nRBC: 0 % (ref 0.0–0.2)

## 2020-03-23 NOTE — Addendum Note (Signed)
Addended by: Delice Bison E on: 03/23/2020 02:56 PM   Modules accepted: Orders

## 2020-03-23 NOTE — Assessment & Plan Note (Addendum)
#  Stage III -lung cancer [no biopsy] clinical non-small cell lung cancer; currently s/p definitive radiation [June 21st, 2021]. NOV 30th, 2021-CT chest-stable left lung mass [likely radiation changes]; no evidence of any progressive or new disease; stable right adrenal nodule.  #Recommend continued surveillance with imaging every 3 months.  Patient is high risk of progression; at which a repeat biopsy could be recommended.  Hold off any systemic therapy at this time.  # Platelets- 85- ?  Asymptomatic.  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 Monitor for now.   # hx of stroke/ right sided weakness chronic; mri brain-wnl.  STABLE.   # DNR/DNI-  # DISPOSITION:  # follow up in 3 months- MD; labs-  Cbc/cmp; CT Chest prior--dr.B  # I reviewed the blood work- with the patient in detail; also reviewed the imaging independently [as summarized above]; and with the patient in detail.

## 2020-03-23 NOTE — Progress Notes (Signed)
[place]  [phone] [phone]   Name: Dillon Garcia Date: 03/23/2020 MRN: [id number]  DOB: 1955-08-31  Patient Care Team: [name], MD as PCP - General ([place]) [name], RN as Oncology Nurse Navigator [name], [name], NP as Nurse Practitioner    REASON FOR CONSULTATION: Dillon Garcia is a 64 y.o. 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  Patient is a resident of an SNF.  He is wheelchair-bound at baseline.  Palliative care was consulted to help address goals.  SOCIAL HISTORY:     reports that he quit smoking about 19 months ago. He has never used smokeless tobacco. He reports previous alcohol use. He reports previous drug use.   He is not married.  He has no children.  He is a long-term resident at [facility].  Patient has a brother who is involved in his care.  ADVANCE DIRECTIVES:  Does not have  CODE STATUS: DNR/DNI (DNR form completed on 10/23/2019)  PAST MEDICAL HISTORY: Past Medical History:  Diagnosis Date  . Cancer ([place])   . GERD (gastroesophageal reflux disease)   . Heart disease   . HTN (hypertension)   . Hyperlipidemia   . Hypothyroid   . Infectious endocarditis 2012  . Mitral valve disease 2012    PAST SURGICAL HISTORY:  Past Surgical History:  Procedure Laterality Date  . MEDIAN STERNOTOMY     CABG? valve replacement?  Marland Kitchen MITRAL VALVE REPLACEMENT  2012    HEMATOLOGY/ONCOLOGY HISTORY:  Oncology History Overview Note  #April 2021-CT/PET scan left lung mass approximately 4-5cm; 12 mediastinal lymph node [initially noted May 2020-incidental/hospital]; high risk for biopsy; Dr.Gonzalez; no biopsy.  Clinically stage III lung cancer ;may/June2021- Definitive radiation  #Right sided  stroke/weakness/ [wheelchair-bound/[facility] resident]; dysarthria; Hx of MVR    Primary cancer of left upper lobe of lung ([place])  09/01/2019 Initial Diagnosis   Primary cancer of left upper lobe of lung ([facility])     ALLERGIES:  has No Known Allergies.  MEDICATIONS:  Current Outpatient Medications  Medication Sig Dispense Refill  . acetaminophen (TYLENOL) 325 MG tablet Take 2 tablets (650 mg total) by mouth every 4 (four) hours as needed for mild pain (or temp > 37.5 C (99.5 F)). (Patient taking differently: Take 650 mg by mouth every 6 (six) hours as needed for mild pain (or temp > 37.5 C (99.5 F)). )    . amLODipine (NORVASC) 10 MG tablet Take 10 mg by mouth daily.     Marland Kitchen aspirin 325 MG tablet Take 1 tablet (325 mg total) by mouth daily. 30 tablet 11  . atorvastatin (LIPITOR) 10 MG tablet Take 1 tablet (10 mg total) by mouth daily at 6 PM. 30 tablet 6  . chlorhexidine (PERIDEX) 0.12 % solution 15 mLs by Mouth Rinse route 3 (three) times daily after meals.    . citalopram (CELEXA) 10 MG tablet Take 10 mg by mouth daily.    Marland Kitchen docusate sodium (COLACE) 100 MG capsule Take 100 mg by mouth daily.    . famotidine (PEPCID) 20 MG tablet Take 1 tablet (20 mg total) by mouth 2 (two) times daily. 60 tablet 6  . gabapentin (NEURONTIN) 100 MG capsule Take 200 mg by mouth 2 (two) times daily.    Marland Kitchen KETOCONAZOLE, TOPICAL, (NIZORAL  A-D) 1 % SHAM Apply 1 application topically 2 (two) times a week.    . levETIRAcetam (KEPPRA) 500 MG tablet Take 500 mg by mouth 2 (two) times daily.    . magic mouthwash SOLN Take 5 mLs by mouth 3 (three) times daily. Before meals    . Maltodextrin-Xanthan Gum (RESOURCE THICKENUP CLEAR) POWD Thicken liquids to honey thick texture 1 Can 6  . multivitamin (PROSIGHT) TABS tablet Take 1 tablet by mouth daily. 30 each 0  . Vitamins A & D (VITAMIN A & D) ointment Apply 1 application topically 2 (two) times daily as needed for dry skin.    Marland Kitchen zinc oxide 20 % ointment Apply 1  application topically 2 (two) times daily as needed for irritation.     No current facility-administered medications for this visit.    VITAL SIGNS: There were no vitals taken for this visit. There were no vitals filed for this visit.  Estimated body mass index is 23.99 kg/m as calculated from the following:   Height as of 12/18/19: 5\' 8"  (1.727 m).   Weight as of an earlier encounter on 03/23/20: 157 lb 12.8 oz (71.6 kg).  LABS: CBC:    Component Value Date/Time   WBC 8.0 03/23/2020 1308   HGB 16.1 03/23/2020 1308   HCT 48.1 03/23/2020 1308   PLT 85 (L) 03/23/2020 1308   MCV 83.9 03/23/2020 1308   NEUTROABS 4.8 03/23/2020 1308   LYMPHSABS 2.0 03/23/2020 1308   MONOABS 0.7 03/23/2020 1308   EOSABS 0.4 03/23/2020 1308   BASOSABS 0.0 03/23/2020 1308   Comprehensive Metabolic Panel:    Component Value Date/Time   NA 141 03/23/2020 1308   K 3.5 03/23/2020 1308   CL 101 03/23/2020 1308   CO2 29 03/23/2020 1308   BUN 19 03/23/2020 1308   CREATININE 0.91 03/23/2020 1308   GLUCOSE 140 (H) 03/23/2020 1308   CALCIUM 9.2 03/23/2020 1308   AST 23 03/23/2020 1308   ALT 25 03/23/2020 1308   ALKPHOS 77 03/23/2020 1308   BILITOT 0.5 03/23/2020 1308   PROT 6.9 03/23/2020 1308   ALBUMIN 3.8 03/23/2020 1308    RADIOGRAPHIC STUDIES: CT CHEST W CONTRAST  Result Date: 03/21/2020 CLINICAL DATA:  Non-small-cell lung cancer.  Restaging. EXAM: CT CHEST WITH CONTRAST TECHNIQUE: Multidetector CT imaging of the chest was performed during intravenous contrast administration. CONTRAST:  68mL OMNIPAQUE IOHEXOL 300 MG/ML  SOLN COMPARISON:  12/16/2019. FINDINGS: Cardiovascular: The heart size is normal. No substantial pericardial effusion. Coronary artery calcification is evident. Status post CABG. Atherosclerotic calcification is noted in the wall of the thoracic aorta. Mediastinum/Nodes: No mediastinal lymphadenopathy. There is no hilar lymphadenopathy. The esophagus has normal imaging features. There  is no axillary lymphadenopathy. Lungs/Pleura: Centrilobular emphsyema noted. Left parahilar radiation scarring again noted. Masslike area of consolidation measured previously at 5.6 x 2.9 cm now measures 4.1 x 2.2 cm. Similar appearance of scarring in the anterior left lower lobe. No new suspicious pulmonary nodule or mass. Tiny left pleural effusion is more conspicuous than on the prior. Upper Abdomen: 1.9 cm right adrenal nodule is stable. Dominant exophytic cyst upper pole left kidney is not substantially changed. Cystic lesion upper pole right kidney is also stable. Musculoskeletal: No worrisome lytic or sclerotic osseous abnormality. IMPRESSION: 1. Stable exam.  No new or progressive interval findings. 2. Stable right adrenal nodule. Continued attention on follow-up recommended. 3. Aortic Atherosclerosis (ICD10-I70.0) and Emphysema (ICD10-J43.9). Electronically Signed   By: Misty Stanley M.D.   On:  03/21/2020 13:31    PERFORMANCE STATUS (ECOG) : 3 - Symptomatic, >50% confined to bed  Review of Systems Unless otherwise noted, a complete review of systems is negative.  Physical Exam General: NAD, wheelchair-bound Pulmonary: Unlabored Extremities: no edema, no joint deformities Skin: no rashes Neurological: Right-sided weakness, dysarthria, alert and oriented  IMPRESSION: Routine follow-up visit.  CT of the chest on 04/16/2020 revealed overall stable appearance with no new or progressive interval findings.  Patient reports he is doing reasonably well.  He remains at facility and is getting rehab.  He denies any significant changes or concerns.  No symptomatic complaints at present.  He is followed by palliative care at the facility.  Appreciate their support.  PLAN: -Continue disease surveillance -DNR/DNI -RTC in 3 months   Patient expressed understanding and was in agreement with this plan. He also understands that He can call the clinic at any time with any questions, concerns, or  complaints.     Time Total: 15 minutes  Visit consisted of counseling and education dealing with the complex and emotionally intense issues of symptom management and palliative care in the setting of serious and potentially life-threatening illness.Greater than 50%  of this time was spent counseling and coordinating care related to the above assessment and plan.  Signed by: Altha Harm, PhD, NP-C

## 2020-03-23 NOTE — Progress Notes (Signed)
Pt resident at [facility], brought into clinic per representative along with facility paperwork.Pt denies any concerns or pain today.

## 2020-03-23 NOTE — Progress Notes (Signed)
[place] NOTE  Patient Care Team: [name], MD as PCP - General ([place]) [name], RN as Oncology Nurse Navigator [name], [name], NP as Nurse Practitioner  CHIEF COMPLAINTS/PURPOSE OF CONSULTATION: LEFT LUNG cancer.     Oncology History Overview Note  #April 2021-CT/PET scan left lung mass approximately 4-5cm; 12 mediastinal lymph node [initially noted May 2020-incidental/hospital]; high risk for biopsy; Dr.Gonzalez; no biopsy.  Clinically stage III lung cancer ;may/June2021- Definitive radiation  #Right sided stroke/weakness/ [wheelchair-bound/[facility] resident]; dysarthria; Hx of MVR    Primary cancer of left upper lobe of lung ([place])  09/01/2019 Initial Diagnosis   Primary cancer of left upper lobe of lung ([place])      HISTORY OF PRESENTING ILLNESS: Patient is minimally verbal because of dysarthria.  Dillon Garcia 64 y.o.  male history of smoking-clinical stage III non-small cell lung cancer [no biopsy] currently s/p radiation is here for follow-up/review results of the CT scan.  Patient denies any new onset of shortness of breath or cough but appetite is fair.  No weight loss.  No headaches.  Chronic right-sided weakness.   Review of Systems  Unable to perform ROS: Patient nonverbal     MEDICAL HISTORY:  Past Medical History:  Diagnosis Date  . Cancer ([place])   . GERD (gastroesophageal reflux disease)   . Heart disease   . HTN (hypertension)   . Hyperlipidemia   . Hypothyroid   . Infectious endocarditis 2012  . Mitral valve disease 2012    SURGICAL HISTORY: Past Surgical History:  Procedure Laterality Date  . MEDIAN STERNOTOMY     CABG? valve replacement?  Marland Kitchen MITRAL VALVE REPLACEMENT  2012    SOCIAL HISTORY: Social History   Socioeconomic History  . Marital status: Single    Spouse name: Not on file  . Number of children: Not on file  . Years of education: Not on file  . Highest education level:  Not on file  Occupational History  . Not on file  Tobacco Use  . Smoking status: Former Smoker    Quit date: 08/21/2018    Years since quitting: 1.5  . Smokeless tobacco: Never Used  Vaping Use  . Vaping Use: Never used  Substance and Sexual Activity  . Alcohol use: Not Currently  . Drug use: Not Currently  . Sexual activity: Not Currently  Other Topics Concern  . Not on file  Social History Narrative   White [employer]; used to work in [employer]; ; never married; used to smoke; no alochol.    Social Determinants of Health   Financial Resource Strain:   . Difficulty of Paying Living Expenses: Not on file  Food Insecurity:   . Worried About Charity fundraiser in the Last Year: Not on file  . Ran Out of Food in the Last Year: Not on file  Transportation Needs:   . Lack of Transportation (Medical): Not on file  . Lack of Transportation (Non-Medical): Not on file  Physical Activity:   . Days of Exercise per Week: Not on file  . Minutes of Exercise per Session: Not on file  Stress:   . Feeling of Stress : Not on file  Social Connections:   . Frequency of Communication with Friends and Family: Not on file  . Frequency of Social Gatherings with Friends and Family: Not on file  . Attends Religious Services: Not on file  . Active Member of Clubs or Organizations: Not on  file  . Attends Archivist Meetings: Not on file  . Marital Status: Not on file  Intimate Partner Violence:   . Fear of Current or Ex-Partner: Not on file  . Emotionally Abused: Not on file  . Physically Abused: Not on file  . Sexually Abused: Not on file    FAMILY HISTORY: History reviewed. No pertinent family history.  ALLERGIES:  has No Known Allergies.  MEDICATIONS:  Current Outpatient Medications  Medication Sig Dispense Refill  . acetaminophen (TYLENOL) 325 MG tablet Take 2 tablets (650 mg total) by mouth every 4 (four) hours as needed for mild pain (or temp > 37.5 C (99.5 F)). (Patient  taking differently: Take 650 mg by mouth every 6 (six) hours as needed for mild pain (or temp > 37.5 C (99.5 F)). )    . amLODipine (NORVASC) 10 MG tablet Take 10 mg by mouth daily.     Marland Kitchen aspirin 325 MG tablet Take 1 tablet (325 mg total) by mouth daily. 30 tablet 11  . atorvastatin (LIPITOR) 10 MG tablet Take 1 tablet (10 mg total) by mouth daily at 6 PM. 30 tablet 6  . chlorhexidine (PERIDEX) 0.12 % solution 15 mLs by Mouth Rinse route 3 (three) times daily after meals.    . citalopram (CELEXA) 10 MG tablet Take 10 mg by mouth daily.    Marland Kitchen docusate sodium (COLACE) 100 MG capsule Take 100 mg by mouth daily.    . famotidine (PEPCID) 20 MG tablet Take 1 tablet (20 mg total) by mouth 2 (two) times daily. 60 tablet 6  . gabapentin (NEURONTIN) 100 MG capsule Take 200 mg by mouth 2 (two) times daily.    Marland Kitchen KETOCONAZOLE, TOPICAL, (NIZORAL A-D) 1 % SHAM Apply 1 application topically 2 (two) times a week.    . levETIRAcetam (KEPPRA) 500 MG tablet Take 500 mg by mouth 2 (two) times daily.    . magic mouthwash SOLN Take 5 mLs by mouth 3 (three) times daily. Before meals    . Maltodextrin-Xanthan Gum (RESOURCE THICKENUP CLEAR) POWD Thicken liquids to honey thick texture 1 Can 6  . multivitamin (PROSIGHT) TABS tablet Take 1 tablet by mouth daily. 30 each 0  . Vitamins A & D (VITAMIN A & D) ointment Apply 1 application topically 2 (two) times daily as needed for dry skin.    Marland Kitchen zinc oxide 20 % ointment Apply 1 application topically 2 (two) times daily as needed for irritation.     No current facility-administered medications for this visit.       Marland Kitchen  PHYSICAL EXAMINATION: ECOG PERFORMANCE STATUS: 0 - Asymptomatic  Vitals:   03/23/20 1335  BP: 122/82  Pulse: 87  Resp: 18  Temp: 97.6 F (36.4 C)  SpO2: 99%   Filed Weights   03/23/20 1335  Weight: 157 lb 12.8 oz (71.6 kg)    Physical Exam Constitutional:      Comments: Patient is wheelchair-bound.  Alone.  HENT:     Head: Normocephalic and  atraumatic.     Mouth/Throat:     Pharynx: No oropharyngeal exudate.  Eyes:     Pupils: Pupils are equal, round, and reactive to light.  Cardiovascular:     Rate and Rhythm: Normal rate and regular rhythm.  Pulmonary:     Effort: No respiratory distress.     Breath sounds: No wheezing.  Abdominal:     General: Bowel sounds are normal. There is no distension.     Palpations: Abdomen is  soft. There is no mass.     Tenderness: There is no abdominal tenderness. There is no guarding or rebound.  Musculoskeletal:        General: No tenderness. Normal range of motion.     Cervical back: Normal range of motion and neck supple.  Skin:    General: Skin is warm.  Neurological:     Mental Status: He is alert and oriented to person, place, and time.     Comments: Chronic weakness of the right upper and lower extremity.  Psychiatric:        Mood and Affect: Affect normal.     LABORATORY DATA:  I have reviewed the data as listed Lab Results  Component Value Date   WBC 8.0 03/23/2020   HGB 16.1 03/23/2020   HCT 48.1 03/23/2020   MCV 83.9 03/23/2020   PLT 85 (L) 03/23/2020   Recent Labs    09/25/19 0844 09/25/19 0844 10/23/19 0859 12/16/19 0956 12/18/19 1358 03/20/20 1009 03/23/20 1308  NA 143   < > 142  --  140  --  141  K 3.3*   < > 3.3*  --  3.6  --  3.5  CL 106   < > 102  --  104  --  101  CO2 27   < > 29  --  27  --  29  GLUCOSE 93   < > 135*  --  143*  --  140*  BUN 10   < > 10  --  14  --  19  CREATININE 0.89   < > 0.84   < > 0.87 1.00 0.91  CALCIUM 9.1   < > 9.1  --  8.8*  --  9.2  GFRNONAA >60   < > >60  --  >60  --  >60  GFRAA >60  --  >60  --  >60  --   --   PROT 7.4   < > 7.3  --  7.1  --  6.9  ALBUMIN 3.3*   < > 2.9*  --  3.8  --  3.8  AST 17   < > 31  --  26  --  23  ALT 13   < > 22  --  26  --  25  ALKPHOS 82   < > 76  --  77  --  77  BILITOT 0.5   < > 0.4  --  0.4  --  0.5   < > = values in this interval not displayed.    RADIOGRAPHIC STUDIES: I have  personally reviewed the radiological images as listed and agreed with the findings in the report. CT CHEST W CONTRAST  Result Date: 03/21/2020 CLINICAL DATA:  Non-small-cell lung cancer.  Restaging. EXAM: CT CHEST WITH CONTRAST TECHNIQUE: Multidetector CT imaging of the chest was performed during intravenous contrast administration. CONTRAST:  61mL OMNIPAQUE IOHEXOL 300 MG/ML  SOLN COMPARISON:  12/16/2019. FINDINGS: Cardiovascular: The heart size is normal. No substantial pericardial effusion. Coronary artery calcification is evident. Status post CABG. Atherosclerotic calcification is noted in the wall of the thoracic aorta. Mediastinum/Nodes: No mediastinal lymphadenopathy. There is no hilar lymphadenopathy. The esophagus has normal imaging features. There is no axillary lymphadenopathy. Lungs/Pleura: Centrilobular emphsyema noted. Left parahilar radiation scarring again noted. Masslike area of consolidation measured previously at 5.6 x 2.9 cm now measures 4.1 x 2.2 cm. Similar appearance of scarring in the anterior left lower lobe. No new suspicious pulmonary nodule  or mass. Tiny left pleural effusion is more conspicuous than on the prior. Upper Abdomen: 1.9 cm right adrenal nodule is stable. Dominant exophytic cyst upper pole left kidney is not substantially changed. Cystic lesion upper pole right kidney is also stable. Musculoskeletal: No worrisome lytic or sclerotic osseous abnormality. IMPRESSION: 1. Stable exam.  No new or progressive interval findings. 2. Stable right adrenal nodule. Continued attention on follow-up recommended. 3. Aortic Atherosclerosis (ICD10-I70.0) and Emphysema (ICD10-J43.9). Electronically Signed   By: Misty Stanley M.D.   On: 03/21/2020 13:31    ASSESSMENT & PLAN:   Primary cancer of left upper lobe of lung ([place]) #Stage III -lung cancer [no biopsy] clinical non-small cell lung cancer; currently s/p definitive radiation [June 21st, 2021]. NOV 30th, 2021-CT chest-stable left  lung mass [likely radiation changes]; no evidence of any progressive or new disease; stable right adrenal nodule.  #Recommend continued surveillance with imaging every 3 months.  Patient is high risk of progression; at which a repeat biopsy could be recommended.  Hold off any systemic therapy at this time.  # Platelets- 85- ?  Asymptomatic.  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 Monitor for now.   # hx of stroke/ right sided weakness chronic; mri brain-wnl.  STABLE.   # DNR/DNI-  # DISPOSITION:  # follow up in 3 months- MD; labs-  Cbc/cmp; CT Chest prior--dr.B  # I reviewed the blood work- with the patient in detail; also reviewed the imaging independently [as summarized above]; and with the patient in detail.   All questions were answered. The patient knows to call the clinic with any problems, questions or concerns.    [name], MD 03/23/2020 2:14 PM

## 2020-05-08 ENCOUNTER — Other Ambulatory Visit: Payer: Self-pay

## 2020-05-08 ENCOUNTER — Non-Acute Institutional Stay: Payer: Medicare Other | Admitting: Primary Care

## 2020-05-08 DIAGNOSIS — I69391 Dysphagia following cerebral infarction: Secondary | ICD-10-CM

## 2020-05-08 DIAGNOSIS — C3412 Malignant neoplasm of upper lobe, left bronchus or lung: Secondary | ICD-10-CM

## 2020-05-08 DIAGNOSIS — I639 Cerebral infarction, unspecified: Secondary | ICD-10-CM

## 2020-05-08 DIAGNOSIS — Z515 Encounter for palliative care: Secondary | ICD-10-CM

## 2020-05-08 NOTE — Progress Notes (Signed)
[facility] Palliative Care Consult Note Telephone: [phone]  Fax: [phone]     Date of encounter: 05/08/20 PATIENT NAME: Dillon Garcia [place] [id number] [phone] (home)  DOB: 12/23/1955 MRN: [id number]  PRIMARY CARE PROVIDER:    [name], MD,  [place] [address] [place]:   [name], [address] [place],  [place] [address] [phone]  RESPONSIBLE PARTY:   Extended Emergency Contact Information Primary Emergency Contact: [place], [place] Mobile Phone: [phone] Relation: Brother  I met face to face with patient in facility. Palliative Care was asked to follow this patient by consultation request of [name], MD to help address advance care planning and goals of care. This is a follow up   visit.   ASSESSMENT AND RECOMMENDATIONS:   1. Advance Care Planning/Goals of Care: Goals include to maximize quality of life and symptom management. DNR on file  2. Symptom Management:    I met with Dillon Garcia in the nursing home. He was up in a wheelchair and able to mobilize himself in the halls. He stated he is feeling good and denies pain; however he does seem to be processing information a little more slowly and answers questions more slowly than on previous visits.   Edema: He also has increased edema in his lower extremities and may have some increased in his hands and face. His increase in weight may be due to fluid.   Mood:  He states he saw his brother over the holiday season which was enjoyable. He does participate in activities at the nursing home but states the time goes by slowly I will continue to follow to monitor for palliative management and overall disease process monitoring.   3. Follow up Palliative Care Visit: Palliative care will continue to follow for goals of care clarification and symptom management. Return 6 weeks or prn.  I spent 15  minutes providing this consultation,  from 1300 to 1315. More than 50% of the time in this consultation was spent coordinating communication.   CODE STATUS:DNR  PPS: 40%  HOSPICE ELIGIBILITY/DIAGNOSIS: TBD  Subjective:  CHIEF COMPLAINT: debility  HISTORY OF PRESENT ILLNESS:  Dillon Garcia is a 65 y.o. year old male  with h/o lung cancer, cva, dysphagia. He is living in LTC due to care needs. He is able to mobilize in w/c and perform some adls but has R hemiplegia.   We are asked to consult around advance care planning and complex medical decision making.    History obtained from review of EMR, discussion with primary team, and  interview with family, caregiver  and/or Dillon Garcia. Records reviewed and summarized above.   CURRENT PROBLEM LIST:  Patient Active Problem List   Diagnosis Date Noted  . Primary cancer of left upper lobe of lung ([place]) 09/01/2019  . Goals of care, counseling/discussion 09/01/2019  . Mass of upper lobe of left lung 08/09/2019  . GERD (gastroesophageal reflux disease) 09/12/2018  . Hyperlipidemia 09/12/2018  . 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 Acute respiratory failure with hypoxia ([place]) 09/12/2018  . COVID-19 virus infection 09/12/2018  . Acute respiratory disease due to COVID-19 virus 09/12/2018  . Aspiration into airway   . Benign essential HTN   . Tobacco abuse   . Seizures ([place])   . Tachypnea   . Bradycardia   . Diastolic dysfunction   . Hypernatremia   . Malnutrition of moderate degree  09/07/2017  . Encounter for orogastric (OG) tube placement   . Cerebral edema ([place]) 09/05/2017  . Stroke (cerebrum) ([place]) 09/04/2017  . Acute ischemic stroke ([place]) 09/04/2017   PAST MEDICAL HISTORY:  Active Ambulatory Problems    Diagnosis Date Noted  . Stroke (cerebrum) ([place]) 09/04/2017  . Acute ischemic stroke ([place]) 09/04/2017  . Cerebral edema ([place]) 09/05/2017  . Encounter for orogastric (OG) tube placement   . Malnutrition of moderate degree  09/07/2017  . Aspiration into airway   . Benign essential HTN   . Tobacco abuse   . Seizures ([place])   . Tachypnea   . Bradycardia   . Diastolic dysfunction   . Hypernatremia   . GERD (gastroesophageal reflux disease) 09/12/2018  . Hyperlipidemia 09/12/2018  . 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 Acute respiratory failure with hypoxia ([place]) 09/12/2018  . COVID-19 virus infection 09/12/2018  . Acute respiratory disease due to COVID-19 virus 09/12/2018  . Mass of upper lobe of left lung 08/09/2019  . Primary cancer of left upper lobe of lung ([place]) 09/01/2019  . Goals of care, counseling/discussion 09/01/2019   Resolved Ambulatory Problems    Diagnosis Date Noted  . No Resolved Ambulatory Problems   Past Medical History:  Diagnosis Date  . Cancer ([place])   . Heart disease   . HTN (hypertension)   . Hypothyroid   . Infectious endocarditis 2012  . Mitral valve disease 2012   SOCIAL HX:  Social History   Tobacco Use  . Smoking status: Former Smoker    Quit date: 08/21/2018    Years since quitting: 1.7  . Smokeless tobacco: Never Used  Substance Use Topics  . Alcohol use: Not Currently   FAMILY HX: No family history on file.    ALLERGIES: No Known Allergies   PERTINENT MEDICATIONS:  Outpatient Encounter Medications as of 05/08/2020  Medication Sig  . amLODipine (NORVASC) 10 MG tablet Take 10 mg by mouth daily.  Marland Kitchen aspirin 325 MG tablet Take 1 tablet (325 mg total) by mouth daily.  Marland Kitchen atorvastatin (LIPITOR) 10 MG tablet Take 1 tablet (10 mg total) by mouth daily at 6 PM.  . chlorhexidine (PERIDEX) 0.12 % solution 15 mLs by Mouth Rinse route 3 (three) times daily after meals.  . citalopram (CELEXA) 10 MG tablet Take 10 mg by mouth daily.  Marland Kitchen docusate sodium (COLACE) 100 MG capsule Take 100 mg by mouth daily.  . famotidine (PEPCID) 20 MG tablet Take 1 tablet (20 mg total) by mouth 2 (two) times daily.  Marland Kitchen gabapentin (NEURONTIN) 100 MG capsule Take 200 mg by mouth 2 (two) times  daily.  Marland Kitchen KETOCONAZOLE, TOPICAL, (NIZORAL A-D) 1 % SHAM Apply 1 application topically 2 (two) times a week.  . levETIRAcetam (KEPPRA) 500 MG tablet Take 500 mg by mouth 2 (two) times daily.  . magic mouthwash SOLN Take 5 mLs by mouth 3 (three) times daily. Before meals  . Maltodextrin-Xanthan Gum (RESOURCE THICKENUP CLEAR) POWD Thicken liquids to honey thick texture  . multivitamin (PROSIGHT) TABS tablet Take 1 tablet by mouth daily.  . Vitamins A & D (VITAMIN A & D) ointment Apply 1 application topically 2 (two) times daily as needed for dry skin.  Marland Kitchen zinc oxide 20 % ointment Apply 1 application topically 2 (two) times daily as needed for irritation.  Marland Kitchen acetaminophen (TYLENOL) 325 MG tablet Take 2 tablets (650 mg total) by mouth every 4 (four) hours as needed for mild pain (or temp > 37.5 C (  99.5 F)). (Patient not taking: Reported on 05/08/2020)   No facility-administered encounter medications on file as of 05/08/2020.    Objective: ROS General: NAD EYES: denies vision changes, wears glasses ENMT: endorses dysphagia Cardiovascular: denies chest pain Pulmonary: denies cough, denies increased SOB Abdomen: endorses good appetite, denies  constipation, endorses continence of bowel GU: denies dysuria, endorses continence of urine MSK:  endorses ROM limitations, no falls reported, R side weakness Skin: denies rashes or wounds Neurological: endorses weakness, denies pain, denies insomnia Psych: Endorses positive mood Heme/lymph/immuno: denies bruises, abnormal bleeding  Physical Exam: Current and past weights:Oct 2021= 149.4, current 157.8, increase of 8 lbs. Constitutional:  NAD General: frail appearing, WNWD EYES: anicteric sclera,lids intact, no discharge  ENMT: intact hearing,oral mucous membranes moist CV:  2-3+ LE edema Pulmonary: no increased work of breathing, no cough, no audible wheezes, room air Abdomen: intake 75-100%, no ascites GU: deferred MSK: mild sarcopenia, decreased  ROM in all extremities, R hemiplegia, non ambulatory Skin: warm and dry, no rashes or wounds on visible skin Neuro: Generalized weakness, R hemiplegia, no cognitive impairment, dysarthria d/t cva Psych: non-anxious affect, A and O x 3 Hem/lymph/immuno: no widespread bruising   Thank you for the opportunity to participate in the care of Dillon Garcia.  The palliative care team will continue to follow. Please call our office at [phone] if we can be of additional assistance.  Jason Coop, NP , DNP, MPH, AGPCNP-BC, ACHPN  COVID-19 PATIENT SCREENING TOOL  Person answering questions: ____________staff______ _____   1.  Is the patient or any family member in the home showing any signs or symptoms regarding respiratory infection?               Person with Symptom- __________NA_________________  a. Fever                                                                          Yes___ No___          ___________________  b. Shortness of breath                                                    Yes___ No___          ___________________ c. Cough/congestion                                       Yes___  No___         ___________________ d. Body aches/pains                                                         Yes___ No___        ____________________ e. Gastrointestinal symptoms (diarrhea, nausea)           Yes___ No___  ____________________  2. Within the past 14 days, has anyone living in the home had any contact with someone with or under investigation for COVID-19?    Yes___ No_X_   Person __________________

## 2020-06-08 ENCOUNTER — Other Ambulatory Visit
Admission: RE | Admit: 2020-06-08 | Discharge: 2020-06-08 | Disposition: A | Discharge status: Home / Self Care | Payer: Medicare Other | Source: Ambulatory Visit | Attending: General Practice | Admitting: General Practice

## 2020-06-08 DIAGNOSIS — K921 Melena: Secondary | ICD-10-CM | POA: Insufficient documentation

## 2020-06-08 LAB — CBC WITH DIFFERENTIAL/PLATELET
Abs Immature Granulocytes: 0.03 10*3/uL (ref 0.00–0.07)
Basophils Absolute: 0 10*3/uL (ref 0.0–0.1)
Basophils Relative: 1 %
Eosinophils Absolute: 0.3 10*3/uL (ref 0.0–0.5)
Eosinophils Relative: 4 %
HCT: 44.3 % (ref 39.0–52.0)
Hemoglobin: 14.2 g/dL (ref 13.0–17.0)
Immature Granulocytes: 0 %
Lymphocytes Relative: 13 %
Lymphs Abs: 1.1 10*3/uL (ref 0.7–4.0)
MCH: 27.2 pg (ref 26.0–34.0)
MCHC: 32.1 g/dL (ref 30.0–36.0)
MCV: 84.9 fL (ref 80.0–100.0)
Monocytes Absolute: 0.7 10*3/uL (ref 0.1–1.0)
Monocytes Relative: 8 %
Neutro Abs: 6.5 10*3/uL (ref 1.7–7.7)
Neutrophils Relative %: 74 %
Platelets: 127 10*3/uL — ABNORMAL LOW (ref 150–400)
RBC: 5.22 MIL/uL (ref 4.22–5.81)
RDW: 14.6 % (ref 11.5–15.5)
WBC: 8.8 10*3/uL (ref 4.0–10.5)
nRBC: 0 % (ref 0.0–0.2)

## 2020-06-22 ENCOUNTER — Other Ambulatory Visit: Payer: Self-pay

## 2020-06-22 ENCOUNTER — Ambulatory Visit
Admission: RE | Admit: 2020-06-22 | Discharge: 2020-06-22 | Disposition: A | Discharge status: Home / Self Care | Payer: Medicare Other | Source: Ambulatory Visit | Attending: Internal Medicine | Admitting: Internal Medicine

## 2020-06-22 DIAGNOSIS — C3412 Malignant neoplasm of upper lobe, left bronchus or lung: Secondary | ICD-10-CM | POA: Insufficient documentation

## 2020-06-22 LAB — POCT I-STAT CREATININE: Creatinine, Ser: 1 mg/dL (ref 0.61–1.24)

## 2020-06-22 MED ORDER — IOHEXOL 300 MG/ML  SOLN
75.0000 mL | Freq: Once | INTRAMUSCULAR | Status: AC | PRN
  Administered 2020-06-22: 75 mL via INTRAVENOUS

## 2020-06-23 ENCOUNTER — Inpatient Hospital Stay: Payer: Medicare Other | Attending: Internal Medicine

## 2020-06-23 ENCOUNTER — Inpatient Hospital Stay (HOSPITAL_BASED_OUTPATIENT_CLINIC_OR_DEPARTMENT_OTHER): Payer: Medicare Other | Admitting: Internal Medicine

## 2020-06-23 ENCOUNTER — Encounter: Payer: Self-pay | Admitting: Internal Medicine

## 2020-06-23 ENCOUNTER — Inpatient Hospital Stay (HOSPITAL_BASED_OUTPATIENT_CLINIC_OR_DEPARTMENT_OTHER): Payer: Medicare Other | Admitting: Hospice and Palliative Medicine

## 2020-06-23 DIAGNOSIS — Z515 Encounter for palliative care: Secondary | ICD-10-CM

## 2020-06-23 DIAGNOSIS — Z7982 Long term (current) use of aspirin: Secondary | ICD-10-CM | POA: Insufficient documentation

## 2020-06-23 DIAGNOSIS — Z923 Personal history of irradiation: Secondary | ICD-10-CM | POA: Insufficient documentation

## 2020-06-23 DIAGNOSIS — I10 Essential (primary) hypertension: Secondary | ICD-10-CM | POA: Insufficient documentation

## 2020-06-23 DIAGNOSIS — Z8673 Personal history of transient ischemic attack (TIA), and cerebral infarction without residual deficits: Secondary | ICD-10-CM | POA: Diagnosis not present

## 2020-06-23 DIAGNOSIS — K219 Gastro-esophageal reflux disease without esophagitis: Secondary | ICD-10-CM | POA: Diagnosis not present

## 2020-06-23 DIAGNOSIS — C3412 Malignant neoplasm of upper lobe, left bronchus or lung: Secondary | ICD-10-CM | POA: Insufficient documentation

## 2020-06-23 DIAGNOSIS — E039 Hypothyroidism, unspecified: Secondary | ICD-10-CM | POA: Insufficient documentation

## 2020-06-23 DIAGNOSIS — Z79899 Other long term (current) drug therapy: Secondary | ICD-10-CM | POA: Insufficient documentation

## 2020-06-23 DIAGNOSIS — Z87891 Personal history of nicotine dependence: Secondary | ICD-10-CM | POA: Diagnosis not present

## 2020-06-23 DIAGNOSIS — J432 Centrilobular emphysema: Secondary | ICD-10-CM | POA: Insufficient documentation

## 2020-06-23 DIAGNOSIS — E785 Hyperlipidemia, unspecified: Secondary | ICD-10-CM | POA: Diagnosis not present

## 2020-06-23 DIAGNOSIS — I700 Atherosclerosis of aorta: Secondary | ICD-10-CM | POA: Insufficient documentation

## 2020-06-23 DIAGNOSIS — K449 Diaphragmatic hernia without obstruction or gangrene: Secondary | ICD-10-CM | POA: Diagnosis not present

## 2020-06-23 LAB — CBC WITH DIFFERENTIAL/PLATELET
Abs Immature Granulocytes: 0.03 10*3/uL (ref 0.00–0.07)
Basophils Absolute: 0.1 10*3/uL (ref 0.0–0.1)
Basophils Relative: 1 %
Eosinophils Absolute: 0.4 10*3/uL (ref 0.0–0.5)
Eosinophils Relative: 5 %
HCT: 47 % (ref 39.0–52.0)
Hemoglobin: 14.8 g/dL (ref 13.0–17.0)
Immature Granulocytes: 0 %
Lymphocytes Relative: 24 %
Lymphs Abs: 1.9 10*3/uL (ref 0.7–4.0)
MCH: 27.1 pg (ref 26.0–34.0)
MCHC: 31.5 g/dL (ref 30.0–36.0)
MCV: 86.1 fL (ref 80.0–100.0)
Monocytes Absolute: 0.8 10*3/uL (ref 0.1–1.0)
Monocytes Relative: 10 %
Neutro Abs: 4.8 10*3/uL (ref 1.7–7.7)
Neutrophils Relative %: 60 %
Platelets: 131 10*3/uL — ABNORMAL LOW (ref 150–400)
RBC: 5.46 MIL/uL (ref 4.22–5.81)
RDW: 14.3 % (ref 11.5–15.5)
WBC: 7.8 10*3/uL (ref 4.0–10.5)
nRBC: 0 % (ref 0.0–0.2)

## 2020-06-23 LAB — COMPREHENSIVE METABOLIC PANEL
ALT: 24 U/L (ref 0–44)
AST: 23 U/L (ref 15–41)
Albumin: 3.6 g/dL (ref 3.5–5.0)
Alkaline Phosphatase: 86 U/L (ref 38–126)
Anion gap: 11 (ref 5–15)
BUN: 17 mg/dL (ref 8–23)
CO2: 26 mmol/L (ref 22–32)
Calcium: 9.3 mg/dL (ref 8.9–10.3)
Chloride: 105 mmol/L (ref 98–111)
Creatinine, Ser: 1.03 mg/dL (ref 0.61–1.24)
GFR, Estimated: >60 mL/min (ref >60)
Glucose, Bld: 109 mg/dL — ABNORMAL HIGH (ref 70–99)
Potassium: 3.6 mmol/L (ref 3.5–5.1)
Sodium: 142 mmol/L (ref 135–145)
Total Bilirubin: 0.6 mg/dL (ref 0.3–1.2)
Total Protein: 7.4 g/dL (ref 6.5–8.1)

## 2020-06-23 NOTE — Assessment & Plan Note (Addendum)
#  Stage III -lung cancer [no biopsy] clinical non-small cell lung cancer; currently s/p definitive radiation [June 21st, 2021]. [facility] 7th CT chest-stable left lung mass [likely radiation changes]; no evidence of any progressive or new disease; stable right adrenal nodule.  #Recommend continued surveillance at this time.  Recommend repeat imaging in 3 months.  # Platelets-131?  Asymptomatic.  Likely ITP.  Continue surveillance without any treatment  # hx of stroke/ right sided weakness chronic; mri brain-wnl.  Stable  # DNR/DNI-  # DISPOSITION:  # follow up in 3 months- MD; labs-  Cbc/cmp; CT Chest prior--dr.B  # I reviewed the blood work- with the patient in detail; also reviewed the imaging independently [as summarized above]; and with the patient in detail.

## 2020-06-23 NOTE — Progress Notes (Signed)
[facility]  [phone] [phone]   Name: Dillon Garcia Date: 06/23/2020 MRN: [id number]  DOB: [birth year]  Patient Care Team: [name], MD as PCP - General ([place]) [name], RN as Oncology Nurse Navigator [name], [name], NP as Nurse Practitioner [name], MD as Consulting Physician (Hematology and Oncology)    REASON FOR CONSULTATION: Dillon Garcia is a 65 y.o. 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  Patient is a resident of an SNF.  He is wheelchair-bound at baseline.  Palliative care was consulted to help address goals.  SOCIAL HISTORY:     reports that he quit smoking about 22 months ago. He has never used smokeless tobacco. He reports previous alcohol use. He reports previous drug use.   He is not married.  He has no children.  He is a long-term resident at [facility].  Patient has a brother who is involved in his care.  ADVANCE DIRECTIVES:  Does not have  CODE STATUS: DNR/DNI (DNR form completed on 10/23/2019)  PAST MEDICAL HISTORY: Past Medical History:  Diagnosis Date   Cancer ([place])    GERD (gastroesophageal reflux disease)    Heart disease    HTN (hypertension)    Hyperlipidemia    Hypothyroid    Infectious endocarditis 2012   Mitral valve disease 2012    PAST SURGICAL HISTORY:  Past Surgical History:  Procedure Laterality Date   MEDIAN STERNOTOMY     CABG? valve replacement?   MITRAL VALVE REPLACEMENT  2012    HEMATOLOGY/ONCOLOGY HISTORY:  Oncology History Overview Note  #April 2021-CT/PET scan left lung mass approximately 4-5cm; 12 mediastinal lymph node [initially noted May 2020-incidental/hospital]; high risk for biopsy; Dr.Gonzalez; no biopsy.   Clinically stage III lung cancer ;may/June2021- Definitive radiation  #Right sided stroke/weakness/ [wheelchair-bound/[facility] resident]; dysarthria; Hx of MVR    Primary cancer of left upper lobe of lung ([place])  09/01/2019 Initial Diagnosis   Primary cancer of left upper lobe of lung ([facility])     ALLERGIES:  has No Known Allergies.  MEDICATIONS:  Current Outpatient Medications  Medication Sig Dispense Refill   acetaminophen (TYLENOL) 325 MG tablet Take 2 tablets (650 mg total) by mouth every 4 (four) hours as needed for mild pain (or temp > 37.5 C (99.5 F)). (Patient not taking: No sig reported)     amLODipine (NORVASC) 10 MG tablet Take 10 mg by mouth daily.     aspirin 325 MG tablet Take 1 tablet (325 mg total) by mouth daily. 30 tablet 11   atorvastatin (LIPITOR) 10 MG tablet Take 1 tablet (10 mg total) by mouth daily at 6 PM. 30 tablet 6   chlorhexidine (PERIDEX) 0.12 % solution 15 mLs by Mouth Rinse route 3 (three) times daily after meals.     citalopram (CELEXA) 10 MG tablet Take 10 mg by mouth daily.     docusate sodium (COLACE) 100 MG capsule Take 100 mg by mouth daily.     famotidine (PEPCID) 20 MG tablet Take 1 tablet (20 mg total) by mouth 2 (two) times daily. 60 tablet 6   gabapentin (NEURONTIN) 100 MG capsule Take 200 mg by mouth 2 (two) times daily.     KETOCONAZOLE, TOPICAL, (NIZORAL A-D) 1 % SHAM Apply 1 application topically 2 (two)  times a week.     levETIRAcetam (KEPPRA) 500 MG tablet Take 500 mg by mouth 2 (two) times daily.     magic mouthwash SOLN Take 5 mLs by mouth 3 (three) times daily. Before meals     Maltodextrin-Xanthan Gum (RESOURCE THICKENUP CLEAR) POWD Thicken liquids to honey thick texture 1 Can 6   multivitamin (PROSIGHT) TABS tablet Take 1 tablet by mouth daily. 30 each 0   Vitamins A & D (VITAMIN A & D) ointment Apply 1 application topically 2 (two) times daily as needed for dry skin.     zinc oxide 20 % ointment Apply 1 application  topically 2 (two) times daily as needed for irritation.     No current facility-administered medications for this visit.    VITAL SIGNS: There were no vitals taken for this visit. There were no vitals filed for this visit.  Estimated body mass index is 23.87 kg/m as calculated from the following:   Height as of an earlier encounter on 06/23/20: 5\' 8"  (1.727 m).   Weight as of an earlier encounter on 06/23/20: 157 lb (71.2 kg).  LABS: CBC:    Component Value Date/Time   WBC 7.8 06/23/2020 1251   HGB 14.8 06/23/2020 1251   HCT 47.0 06/23/2020 1251   PLT 131 (L) 06/23/2020 1251   MCV 86.1 06/23/2020 1251   NEUTROABS 4.8 06/23/2020 1251   LYMPHSABS 1.9 06/23/2020 1251   MONOABS 0.8 06/23/2020 1251   EOSABS 0.4 06/23/2020 1251   BASOSABS 0.1 06/23/2020 1251   Comprehensive Metabolic Panel:    Component Value Date/Time   NA 142 06/23/2020 1251   K 3.6 06/23/2020 1251   CL 105 06/23/2020 1251   CO2 26 06/23/2020 1251   BUN 17 06/23/2020 1251   CREATININE 1.03 06/23/2020 1251   GLUCOSE 109 (H) 06/23/2020 1251   CALCIUM 9.3 06/23/2020 1251   AST 23 06/23/2020 1251   ALT 24 06/23/2020 1251   ALKPHOS 86 06/23/2020 1251   BILITOT 0.6 06/23/2020 1251   PROT 7.4 06/23/2020 1251   ALBUMIN 3.6 06/23/2020 1251    RADIOGRAPHIC STUDIES: CT Chest W Contrast  Result Date: 06/22/2020 CLINICAL DATA:  Metastatic non-small cell lung cancer. EXAM: CT CHEST WITH CONTRAST TECHNIQUE: Multidetector CT imaging of the chest was performed during intravenous contrast administration. CONTRAST:  50mL OMNIPAQUE IOHEXOL 300 MG/ML  SOLN COMPARISON:  03/20/2020. FINDINGS: Cardiovascular: Atherosclerotic calcification of the aorta. Heart is enlarged. No pericardial effusion. Mediastinum/Nodes: No pathologically enlarged mediastinal, hilar or axillary lymph nodes. Esophagus is grossly unremarkable. Lungs/Pleura: Centrilobular emphysema. Image quality in the lung bases is degraded by respiratory motion. Post  treatment pulmonary parenchymal retraction and architectural distortion in the lingula with additional scarring in the anterior left lower lobe. No pleural fluid. There is narrowing of the lingular bronchus, unchanged. Airway is otherwise unremarkable. Upper Abdomen: Visualized portions of the liver and gallbladder are unremarkable. Hyperdense right adrenal nodule measures 1.9 cm, stable. Visualized portion of the left adrenal gland is unremarkable. Low-attenuation lesion in the left renal fossa measures 8.0 cm and presumably arises from the left kidney, incompletely visualized. Visualized portions of the spleen, pancreas, stomach and bowel are unremarkable with the exception of a small hiatal hernia. No upper abdominal adenopathy. Musculoskeletal: Degenerative changes in the spine. Vague rounded sclerotic lesion in the T9 vertebral body is unchanged. IMPRESSION: 1. Post treatment architectural distortion in the left upper lobe, stable. No evidence of metastatic disease. 2. Stable right adrenal nodule, likely an adenoma. 3.  Aortic atherosclerosis (ICD10-I70.0). 4.  Emphysema (ICD10-J43.9). Electronically Signed   By: Lorin Picket M.D.   On: 06/22/2020 14:46    PERFORMANCE STATUS (ECOG) : 3 - Symptomatic, >50% confined to bed  Review of Systems Unless otherwise noted, a complete review of systems is negative.  Physical Exam General: NAD, wheelchair-bound Pulmonary: Unlabored Extremities: no edema, no joint deformities Skin: no rashes Neurological: Right-sided weakness, dysarthria, alert and oriented  IMPRESSION: Routine follow-up visit.  CT of the chest on 06/22/2020 revealed overall stable appearance with no new or progressive interval findings.  Patient reports he is doing reasonably well.  He denies any significant changes or concerns.  No symptomatic complaints at present.  No issues with medications today.  Patient continues to reside at [facility]. He is followed by palliative care at  [facility].  PLAN: -Continued surveillance -RTC in 3 months   Patient expressed understanding and was in agreement with this plan. He also understands that He can call the clinic at any time with any questions, concerns, or complaints.     Time Total: 15 minutes  Visit consisted of counseling and education dealing with the complex and emotionally intense issues of symptom management and palliative care in the setting of serious and potentially life-threatening illness.Greater than 50%  of this time was spent counseling and coordinating care related to the above assessment and plan.  Signed by: Altha Harm, PhD, NP-C

## 2020-06-23 NOTE — Progress Notes (Signed)
[place] NOTE  Patient Care Team: [name], MD as PCP - General ([place]) [name], RN as Oncology Nurse Navigator [name], [name], NP as Nurse Practitioner [name], MD as Consulting Physician (Hematology and Oncology)  CHIEF COMPLAINTS/PURPOSE OF CONSULTATION: LEFT LUNG cancer.     Oncology History Overview Note  #April 2021-CT/PET scan left lung mass approximately 4-5cm; 12 mediastinal lymph node [initially noted May 2020-incidental/hospital]; high risk for biopsy; Dr.Gonzalez; no biopsy.  Clinically stage III lung cancer ;may/June2021- Definitive radiation  #Right sided stroke/weakness/ [wheelchair-bound/[facility] resident]; dysarthria; Hx of MVR    Primary cancer of left upper lobe of lung ([place])  09/01/2019 Initial Diagnosis   Primary cancer of left upper lobe of lung ([place])      HISTORY OF PRESENTING ILLNESS: Patient is minimally verbal because of dysarthria.  Dillon Garcia 65 y.o.  male history of smoking-clinical stage III non-small cell lung cancer [no biopsy] currently s/p radiation is here for follow-up/review results of the CT scan.  Patient denies any worsening shortness of breath or cough he denies any fevers or chills.  Denies any headaches.  Chronic right-sided weakness denies any new onset of weakness.   Review of Systems  Unable to perform ROS: Patient nonverbal     MEDICAL HISTORY:  Past Medical History:  Diagnosis Date  . Cancer ([place])   . GERD (gastroesophageal reflux disease)   . Heart disease   . HTN (hypertension)   . Hyperlipidemia   . Hypothyroid   . Infectious endocarditis 2012  . Mitral valve disease 2012    SURGICAL HISTORY: Past Surgical History:  Procedure Laterality Date  . MEDIAN STERNOTOMY     CABG? valve replacement?  Marland Kitchen MITRAL VALVE REPLACEMENT  2012    SOCIAL HISTORY: Social History   Socioeconomic History  . Marital status: Single    Spouse name: Not  on file  . Number of children: Not on file  . Years of education: Not on file  . Highest education level: Not on file  Occupational History  . Not on file  Tobacco Use  . Smoking status: Former Smoker    Quit date: 08/21/2018    Years since quitting: 1.8  . Smokeless tobacco: Never Used  Vaping Use  . Vaping Use: Never used  Substance and Sexual Activity  . Alcohol use: Not Currently  . Drug use: Not Currently  . Sexual activity: Not Currently  Other Topics Concern  . Not on file  Social History Narrative   [facility] [employer]; used to work in [employer]; ; never married; used to smoke; no alochol.    Social Determinants of Health   Financial Resource Strain: Not on file  Food Insecurity: Not on file  Transportation Needs: Not on file  Physical Activity: Not on file  Stress: Not on file  Social Connections: Not on file  Intimate Partner Violence: Not on file    FAMILY HISTORY: No family history on file.  ALLERGIES:  has No Known Allergies.  MEDICATIONS:  Current Outpatient Medications  Medication Sig Dispense Refill  . amLODipine (NORVASC) 10 MG tablet Take 10 mg by mouth daily.    Marland Kitchen aspirin 325 MG tablet Take 1 tablet (325 mg total) by mouth daily. 30 tablet 11  . atorvastatin (LIPITOR) 10 MG tablet Take 1 tablet (10 mg total) by mouth daily at 6 PM. 30 tablet 6  . chlorhexidine (PERIDEX) 0.12 % solution 15 mLs by Mouth Rinse route  3 (three) times daily after meals.    . citalopram (CELEXA) 10 MG tablet Take 10 mg by mouth daily.    Marland Kitchen docusate sodium (COLACE) 100 MG capsule Take 100 mg by mouth daily.    . famotidine (PEPCID) 20 MG tablet Take 1 tablet (20 mg total) by mouth 2 (two) times daily. 60 tablet 6  . gabapentin (NEURONTIN) 100 MG capsule Take 200 mg by mouth 2 (two) times daily.    Marland Kitchen KETOCONAZOLE, TOPICAL, (NIZORAL A-D) 1 % SHAM Apply 1 application topically 2 (two) times a week.    . levETIRAcetam (KEPPRA) 500 MG tablet Take 500 mg by mouth 2 (two) times  daily.    . magic mouthwash SOLN Take 5 mLs by mouth 3 (three) times daily. Before meals    . Maltodextrin-Xanthan Gum (RESOURCE THICKENUP CLEAR) POWD Thicken liquids to honey thick texture 1 Can 6  . multivitamin (PROSIGHT) TABS tablet Take 1 tablet by mouth daily. 30 each 0  . Vitamins A & D (VITAMIN A & D) ointment Apply 1 application topically 2 (two) times daily as needed for dry skin.    Marland Kitchen zinc oxide 20 % ointment Apply 1 application topically 2 (two) times daily as needed for irritation.    Marland Kitchen acetaminophen (TYLENOL) 325 MG tablet Take 2 tablets (650 mg total) by mouth every 4 (four) hours as needed for mild pain (or temp > 37.5 C (99.5 F)). (Patient not taking: No sig reported)     No current facility-administered medications for this visit.       Marland Kitchen  PHYSICAL EXAMINATION: ECOG PERFORMANCE STATUS: 0 - Asymptomatic  Vitals:   06/23/20 1322  BP: 118/84  Pulse: 84  Resp: 16  Temp: (!) 96.8 F (36 C)  SpO2: 99%   Filed Weights   06/23/20 1322  Weight: 157 lb (71.2 kg)    Physical Exam Constitutional:      Comments: Patient is wheelchair-bound.  Alone.  HENT:     Head: Normocephalic and atraumatic.     Mouth/Throat:     Pharynx: No oropharyngeal exudate.  Eyes:     Pupils: Pupils are equal, round, and reactive to light.  Cardiovascular:     Rate and Rhythm: Normal rate and regular rhythm.  Pulmonary:     Effort: No respiratory distress.     Breath sounds: No wheezing.  Abdominal:     General: Bowel sounds are normal. There is no distension.     Palpations: Abdomen is soft. There is no mass.     Tenderness: There is no abdominal tenderness. There is no guarding or rebound.  Musculoskeletal:        General: No tenderness. Normal range of motion.     Cervical back: Normal range of motion and neck supple.  Skin:    General: Skin is warm.  Neurological:     Mental Status: He is alert and oriented to person, place, and time.     Comments: Chronic weakness of the  right upper and lower extremity.  Psychiatric:        Mood and Affect: Affect normal.     LABORATORY DATA:  I have reviewed the data as listed Lab Results  Component Value Date   WBC 7.8 06/23/2020   HGB 14.8 06/23/2020   HCT 47.0 06/23/2020   MCV 86.1 06/23/2020   PLT 131 (L) 06/23/2020   Recent Labs    09/25/19 0844 10/23/19 0859 12/16/19 0956 12/18/19 1358 03/20/20 1009 03/23/20 1308 06/22/20 1132  06/23/20 1251  NA 143 142  --  140  --  141  --  142  K 3.3* 3.3*  --  3.6  --  3.5  --  3.6  CL 106 102  --  104  --  101  --  105  CO2 27 29  --  27  --  29  --  26  GLUCOSE 93 135*  --  143*  --  140*  --  109*  BUN 10 10  --  14  --  19  --  17  CREATININE 0.89 0.84   < > 0.87   < > 0.91 1.00 1.03  CALCIUM 9.1 9.1  --  8.8*  --  9.2  --  9.3  GFRNONAA >60 >60  --  >60  --  >60  --  >60  GFRAA >60 >60  --  >60  --   --   --   --   PROT 7.4 7.3  --  7.1  --  6.9  --  7.4  ALBUMIN 3.3* 2.9*  --  3.8  --  3.8  --  3.6  AST 17 31  --  26  --  23  --  23  ALT 13 22  --  26  --  25  --  24  ALKPHOS 82 76  --  77  --  77  --  86  BILITOT 0.5 0.4  --  0.4  --  0.5  --  0.6   < > = values in this interval not displayed.    RADIOGRAPHIC STUDIES: I have personally reviewed the radiological images as listed and agreed with the findings in the report. CT Chest W Contrast  Result Date: 06/22/2020 CLINICAL DATA:  Metastatic non-small cell lung cancer. EXAM: CT CHEST WITH CONTRAST TECHNIQUE: Multidetector CT imaging of the chest was performed during intravenous contrast administration. CONTRAST:  51mL OMNIPAQUE IOHEXOL 300 MG/ML  SOLN COMPARISON:  03/20/2020. FINDINGS: Cardiovascular: Atherosclerotic calcification of the aorta. Heart is enlarged. No pericardial effusion. Mediastinum/Nodes: No pathologically enlarged mediastinal, hilar or axillary lymph nodes. Esophagus is grossly unremarkable. Lungs/Pleura: Centrilobular emphysema. Image quality in the lung bases is degraded by  respiratory motion. Post treatment pulmonary parenchymal retraction and architectural distortion in the lingula with additional scarring in the anterior left lower lobe. No pleural fluid. There is narrowing of the lingular bronchus, unchanged. Airway is otherwise unremarkable. Upper Abdomen: Visualized portions of the liver and gallbladder are unremarkable. Hyperdense right adrenal nodule measures 1.9 cm, stable. Visualized portion of the left adrenal gland is unremarkable. Low-attenuation lesion in the left renal fossa measures 8.0 cm and presumably arises from the left kidney, incompletely visualized. Visualized portions of the spleen, pancreas, stomach and bowel are unremarkable with the exception of a small hiatal hernia. No upper abdominal adenopathy. Musculoskeletal: Degenerative changes in the spine. Vague rounded sclerotic lesion in the T9 vertebral body is unchanged. IMPRESSION: 1. Post treatment architectural distortion in the left upper lobe, stable. No evidence of metastatic disease. 2. Stable right adrenal nodule, likely an adenoma. 3.  Aortic atherosclerosis (ICD10-I70.0). 4.  Emphysema (ICD10-J43.9). Electronically Signed   By: Lorin Picket M.D.   On: 06/22/2020 14:46    ASSESSMENT & PLAN:   Primary cancer of left upper lobe of lung ([place]) #Stage III -lung cancer [no biopsy] clinical non-small cell lung cancer; currently s/p definitive radiation [June 21st, 2021]. MARCH 7th CT chest-stable left lung mass [likely radiation changes]; no evidence  of any progressive or new disease; stable right adrenal nodule.  #Recommend continued surveillance at this time.  Recommend repeat imaging in 3 months.  # Platelets-131?  Asymptomatic.  Likely ITP.  Continue surveillance without any treatment  # hx of stroke/ right sided weakness chronic; mri brain-wnl.  Stable  # DNR/DNI-  # DISPOSITION:  # follow up in 3 months- MD; labs-  Cbc/cmp; CT Chest prior--dr.B  # I reviewed the blood work- with  the patient in detail; also reviewed the imaging independently [as summarized above]; and with the patient in detail.     All questions were answered. The patient knows to call the clinic with any problems, questions or concerns.    [name], MD 06/23/2020 3:36 PM

## 2020-08-06 ENCOUNTER — Emergency Department
Admission: EM | Admit: 2020-08-06 | Discharge: 2020-08-06 | Disposition: A | Discharge status: Home / Self Care | Payer: Medicare Other | Attending: Emergency Medicine | Admitting: Emergency Medicine

## 2020-08-06 ENCOUNTER — Other Ambulatory Visit: Payer: Self-pay

## 2020-08-06 ENCOUNTER — Emergency Department: Payer: Medicare Other

## 2020-08-06 DIAGNOSIS — E039 Hypothyroidism, unspecified: Secondary | ICD-10-CM | POA: Insufficient documentation

## 2020-08-06 DIAGNOSIS — C3490 Malignant neoplasm of unspecified part of unspecified bronchus or lung: Secondary | ICD-10-CM | POA: Diagnosis not present

## 2020-08-06 DIAGNOSIS — Z20822 Contact with and (suspected) exposure to covid-19: Secondary | ICD-10-CM | POA: Insufficient documentation

## 2020-08-06 DIAGNOSIS — Z8616 Personal history of COVID-19: Secondary | ICD-10-CM | POA: Diagnosis not present

## 2020-08-06 DIAGNOSIS — C7931 Secondary malignant neoplasm of brain: Secondary | ICD-10-CM | POA: Insufficient documentation

## 2020-08-06 DIAGNOSIS — Z951 Presence of aortocoronary bypass graft: Secondary | ICD-10-CM | POA: Diagnosis not present

## 2020-08-06 DIAGNOSIS — I5030 Unspecified diastolic (congestive) heart failure: Secondary | ICD-10-CM | POA: Diagnosis not present

## 2020-08-06 DIAGNOSIS — R4701 Aphasia: Secondary | ICD-10-CM | POA: Diagnosis not present

## 2020-08-06 DIAGNOSIS — I110 Hypertensive heart disease with heart failure: Secondary | ICD-10-CM | POA: Insufficient documentation

## 2020-08-06 DIAGNOSIS — Z87891 Personal history of nicotine dependence: Secondary | ICD-10-CM | POA: Diagnosis not present

## 2020-08-06 DIAGNOSIS — R531 Weakness: Secondary | ICD-10-CM | POA: Diagnosis not present

## 2020-08-06 LAB — COMPREHENSIVE METABOLIC PANEL
ALT: 33 U/L (ref 0–44)
AST: 32 U/L (ref 15–41)
Albumin: 3.6 g/dL (ref 3.5–5.0)
Alkaline Phosphatase: 89 U/L (ref 38–126)
Anion gap: 7 (ref 5–15)
BUN: 12 mg/dL (ref 8–23)
CO2: 30 mmol/L (ref 22–32)
Calcium: 9.3 mg/dL (ref 8.9–10.3)
Chloride: 106 mmol/L (ref 98–111)
Creatinine, Ser: 1.01 mg/dL (ref 0.61–1.24)
GFR, Estimated: >60 mL/min (ref >60)
Glucose, Bld: 100 mg/dL — ABNORMAL HIGH (ref 70–99)
Potassium: 3.8 mmol/L (ref 3.5–5.1)
Sodium: 143 mmol/L (ref 135–145)
Total Bilirubin: 0.5 mg/dL (ref 0.3–1.2)
Total Protein: 7.2 g/dL (ref 6.5–8.1)

## 2020-08-06 LAB — CBC WITH DIFFERENTIAL/PLATELET
Abs Immature Granulocytes: 0.02 10*3/uL (ref 0.00–0.07)
Basophils Absolute: 0 10*3/uL (ref 0.0–0.1)
Basophils Relative: 1 %
Eosinophils Absolute: 0.3 10*3/uL (ref 0.0–0.5)
Eosinophils Relative: 4 %
HCT: 48.4 % (ref 39.0–52.0)
Hemoglobin: 15.5 g/dL (ref 13.0–17.0)
Immature Granulocytes: 0 %
Lymphocytes Relative: 24 %
Lymphs Abs: 1.6 10*3/uL (ref 0.7–4.0)
MCH: 27 pg (ref 26.0–34.0)
MCHC: 32 g/dL (ref 30.0–36.0)
MCV: 84.3 fL (ref 80.0–100.0)
Monocytes Absolute: 0.9 10*3/uL (ref 0.1–1.0)
Monocytes Relative: 13 %
Neutro Abs: 4.1 10*3/uL (ref 1.7–7.7)
Neutrophils Relative %: 58 %
Platelets: 115 10*3/uL — ABNORMAL LOW (ref 150–400)
RBC: 5.74 MIL/uL (ref 4.22–5.81)
RDW: 14.3 % (ref 11.5–15.5)
WBC: 6.9 10*3/uL (ref 4.0–10.5)
nRBC: 0 % (ref 0.0–0.2)

## 2020-08-06 LAB — URINALYSIS, COMPLETE (UACMP) WITH MICROSCOPIC
Bacteria, UA: NONE SEEN
Bilirubin Urine: NEGATIVE
Glucose, UA: NEGATIVE mg/dL
Hgb urine dipstick: NEGATIVE
Ketones, ur: NEGATIVE mg/dL
Leukocytes,Ua: NEGATIVE
Nitrite: NEGATIVE
Protein, ur: NEGATIVE mg/dL
Specific Gravity, Urine: 1.011 (ref 1.005–1.030)
Squamous Epithelial / HPF: NONE SEEN (ref 0–5)
pH: 9 — ABNORMAL HIGH (ref 5.0–8.0)

## 2020-08-06 LAB — RESP PANEL BY RT-PCR (FLU A&B, COVID) ARPGX2
Influenza A by PCR: NEGATIVE
Influenza B by PCR: NEGATIVE
SARS Coronavirus 2 by RT PCR: NEGATIVE

## 2020-08-06 LAB — TROPONIN I (HIGH SENSITIVITY): Troponin I (High Sensitivity): 6 ng/L (ref <18)

## 2020-08-06 MED ORDER — DEXAMETHASONE SODIUM PHOSPHATE 10 MG/ML IJ SOLN
10.0000 mg | Freq: Once | INTRAMUSCULAR | Status: AC
  Administered 2020-08-06: 10 mg via INTRAVENOUS
  Filled 2020-08-06: qty 1

## 2020-08-06 MED ORDER — LEVETIRACETAM IN NACL 500 MG/100ML IV SOLN
500.0000 mg | Freq: Once | INTRAVENOUS | Status: AC
  Administered 2020-08-06: 500 mg via INTRAVENOUS
  Filled 2020-08-06 (×2): qty 100

## 2020-08-06 MED ORDER — DEXAMETHASONE 2 MG PO TABS: ORAL_TABLET | ORAL | 0 refills | Status: AC

## 2020-08-06 NOTE — ED Triage Notes (Signed)
Pt to ED via [facility] from [facility] Staff called EMS because pt usually walks and is active but has not been getting ou tof bed for past 3d Pt has R sided deficits from previous stroke 30yrs ago and also expressive dysphagia Pt had UTI 2wk ago, finished levoquin 1wk ago EDP at bedside CBG was 133, T was 99.6 oral with EMS Pt has DNR papers with him

## 2020-08-06 NOTE — ED Provider Notes (Signed)
[facility] Emergency Department Provider Note  ____________________________________________   Event Date/Time   First MD Initiated Contact with Patient 08/06/20 [phone]     (approximate)  I have reviewed the triage vital signs and the nursing notes.   HISTORY  Chief Complaint Weakness   HPI Dillon Garcia is a 65 y.o. male with non-small cell lung cancer, prior CVA with right-sided weakness and aphasia, who comes in from [facility] due to worsening weakness.  Patient reportedly has not been acting his normal self for 2 to 3 days.  They mostly note worsening weakness mostly on the right side, constant, nothing makes it better, nothing makes it worse.  The report from EMS is that he typically is able to ambulate but secondary to the weakness has not been able to for the past 2 to 3 days.  They also report that patient had a recent UTI 2 weeks ago and finished Levaquin 1 week ago.  Patient has some low-grade temperatures of 99.6 with EMS.  Patient does have DNR paperwork.  Patient is difficult to understand due to baseline aphasia but is able to nod no to being in any pain.  Unable to get full HPI due to patient's baseline aphasia secondary to prior stroke          Past Medical History:  Diagnosis Date  . Cancer ([place])   . GERD (gastroesophageal reflux disease)   . Heart disease   . HTN (hypertension)   . Hyperlipidemia   . Hypothyroid   . Infectious endocarditis 2012  . Mitral valve disease 2012    Patient Active Problem List   Diagnosis Date Noted  . Primary cancer of left upper lobe of lung ([place]) 09/01/2019  . Goals of care, counseling/discussion 09/01/2019  . Mass of upper lobe of left lung 08/09/2019  . GERD (gastroesophageal reflux disease) 09/12/2018  . Hyperlipidemia 09/12/2018  . 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 Acute respiratory failure with hypoxia ([place]) 09/12/2018  . COVID-19 virus infection 09/12/2018  . Acute respiratory  disease due to COVID-19 virus 09/12/2018  . Aspiration into airway   . Benign essential HTN   . Tobacco abuse   . Seizures ([place])   . Tachypnea   . Bradycardia   . Diastolic dysfunction   . Hypernatremia   . Malnutrition of moderate degree 09/07/2017  . Encounter for orogastric (OG) tube placement   . Cerebral edema ([place]) 09/05/2017  . Stroke (cerebrum) ([place]) 09/04/2017  . Acute ischemic stroke ([place]) 09/04/2017    Past Surgical History:  Procedure Laterality Date  . MEDIAN STERNOTOMY     CABG? valve replacement?  Marland Kitchen MITRAL VALVE REPLACEMENT  2012    Prior to Admission medications   Medication Sig Start Date End Date Taking? Authorizing Provider  acetaminophen (TYLENOL) 325 MG tablet Take 2 tablets (650 mg total) by mouth every 4 (four) hours as needed for mild pain (or temp > 37.5 C (99.5 F)). Patient not taking: No sig reported 09/15/17   Rinehuls, David L, PA-C  amLODipine (NORVASC) 10 MG tablet Take 10 mg by mouth daily.    [provider]  aspirin 325 MG tablet Take 1 tablet (325 mg total) by mouth daily. 09/16/17   Rinehuls, Early Chars, PA-C  atorvastatin (LIPITOR) 10 MG tablet Take 1 tablet (10 mg total) by mouth daily at 6 PM. 09/15/17   Rinehuls, Early Chars, PA-C  chlorhexidine (PERIDEX) 0.12 % solution 15 mLs by Mouth Rinse route  3 (three) times daily after meals. 09/19/19   [provider]  citalopram (CELEXA) 10 MG tablet Take 10 mg by mouth daily. 08/08/19   [provider]  docusate sodium (COLACE) 100 MG capsule Take 100 mg by mouth daily.    [provider]  famotidine (PEPCID) 20 MG tablet Take 1 tablet (20 mg total) by mouth 2 (two) times daily. 09/15/17   Rinehuls, Early Chars, PA-C  gabapentin (NEURONTIN) 100 MG capsule Take 200 mg by mouth 2 (two) times daily.    [provider]  KETOCONAZOLE, TOPICAL, (NIZORAL A-D) 1 % SHAM Apply 1 application topically 2 (two) times a week.    [provider]  levETIRAcetam (KEPPRA) 500 MG  tablet Take 500 mg by mouth 2 (two) times daily.    [provider]  magic mouthwash SOLN Take 5 mLs by mouth 3 (three) times daily. Before meals    [provider]  Maltodextrin-Xanthan Gum (RESOURCE THICKENUP CLEAR) POWD Thicken liquids to honey thick texture 09/15/17   Rinehuls, Early Chars, PA-C  multivitamin (PROSIGHT) TABS tablet Take 1 tablet by mouth daily. 09/16/17   Rinehuls, Early Chars, PA-C  Vitamins A & D (VITAMIN A & D) ointment Apply 1 application topically 2 (two) times daily as needed for dry skin.    [provider]  zinc oxide 20 % ointment Apply 1 application topically 2 (two) times daily as needed for irritation.    [provider]    Allergies Patient has no known allergies.  No family history on file.  Social History Social History   Tobacco Use  . Smoking status: Former Smoker    Quit date: 08/21/2018    Years since quitting: 1.9  . Smokeless tobacco: Never Used  Vaping Use  . Vaping Use: Never used  Substance Use Topics  . Alcohol use: Not Currently  . Drug use: Not Currently      Review of Systems Unable to get full review of systems from patient due to patient's baseline aphasia from prior stroke ____________________________________________   PHYSICAL EXAM:  VITAL SIGNS: Blood pressure 129/85, pulse 84, temperature 97.8 F (36.6 C), temperature source Axillary, resp. rate 14, height 5\' 4"  (1.626 m), weight 70.3 kg, SpO2 99 %.   Constitutional: Alert.  No acute distress Eyes: Conjunctivae are normal. EOMI. Head: Atraumatic. Nose: No congestion/rhinnorhea. Mouth/Throat: Mucous membranes are moist.   Neck: No stridor. Trachea Midline. FROM Cardiovascular: Normal rate, regular rhythm. Grossly normal heart sounds.  Good peripheral circulation. Respiratory: Normal respiratory effort.  No retractions. Lungs CTAB. Gastrointestinal: Soft and nontender. No distention. No abdominal bruits.  Musculoskeletal: No lower extremity  tenderness nor edema.  No joint effusions. Neurologic: Aphasia and right-sided weakness at baseline may be slightly worse than prior Skin:  Skin is warm, dry and intact. No rash noted. Psychiatric: Mood and affect are normal.  GU: Deferred   ____________________________________________   LABS (all labs ordered are listed, but only abnormal results are displayed)  Labs Reviewed  CBC WITH DIFFERENTIAL/PLATELET - Abnormal; Notable for the following components:      Result Value   Platelets 115 (*)    All other components within normal limits  COMPREHENSIVE METABOLIC PANEL - Abnormal; Notable for the following components:   Glucose, Bld 100 (*)    All other components within normal limits  URINALYSIS, COMPLETE (UACMP) WITH MICROSCOPIC - Abnormal; Notable for the following components:   Color, Urine YELLOW (*)    APPearance CLOUDY (*)  pH 9.0 (*)    All other components within normal limits  RESP PANEL BY RT-PCR (FLU A&B, COVID) ARPGX2  TROPONIN I (HIGH SENSITIVITY)  TROPONIN I (HIGH SENSITIVITY)   ____________________________________________   ED ECG REPORT I, Vanessa Clarion, the attending physician, personally viewed and interpreted this ECG.  Sinus rate of 87, no ST elevation, no T wave versions, normal intervals although some artifact ____________________________________________  RADIOLOGY Robert Bellow, personally viewed and evaluated these images (plain radiographs) as part of my medical decision making, as well as reviewing the written report by the radiologist.  ED MD interpretation:   I personally reviewed CT imaging that shows frontal mass with edema and shift  Official radiology report(s): CT Head Wo Contrast  Result Date: 08/06/2020 CLINICAL DATA:  Mental status change with unknown cause EXAM: CT HEAD WITHOUT CONTRAST TECHNIQUE: Contiguous axial images were obtained from the base of the skull through the vertex without intravenous contrast. COMPARISON:   08/29/2019 FINDINGS: Brain: Heterogeneous, solid and cystic appearing mass in the inferior and anterior left frontal lobe with peripheral hemorrhagic components. The isodense component alone measures up to 4 cm. Vasogenic edema with local mass effect and midline shift measuring 1 cm. No entrapment or ACA territory infarct. Rounded low-density area newly seen in the vermis measuring 2 cm. Remote left cerebellar infarct with wallerian degeneration seen into the pons. Vascular: Negative Skull: No visible bony metastasis Sinuses/Orbits: Negative Other: Critical Value/emergent results were called by telephone at the time of interpretation on 08/06/2020 at 9:02 am to provider San[facility] [place] , who verbally acknowledged these results. IMPRESSION: 1. Large, heterogeneous and hemorrhagic left inferior frontal mass with vasogenic edema and 1 cm of midline shift. 2. Ovoid low-density mass in the vermis measuring 2 cm. 3. Above findings are consistent with metastatic disease in this patient with recently treated lung cancer. 4. Remote left cerebellar and brainstem infarct. Electronically Signed   By: Monte Fantasia M.D.   On: 08/06/2020 09:05    ____________________________________________   PROCEDURES  Procedure(s) performed (including Critical Care):  .1-3 Lead EKG Interpretation Performed by: Vanessa Basalt, MD Authorized by: Vanessa Valdez, MD     Interpretation: normal     ECG rate:  80s    ECG rate assessment: normal     Rhythm: sinus rhythm     Ectopy: none     Conduction: normal       ____________________________________________   INITIAL IMPRESSION / ASSESSMENT AND PLAN / ED COURSE  Dillon Garcia was evaluated in Emergency Department on 08/06/2020 for the symptoms described in the history of present illness. He was evaluated in the context of the global COVID-19 pandemic, which necessitated consideration that the patient might be at risk for infection with the SARS-CoV-2 virus that causes COVID-19.  Institutional protocols and algorithms that pertain to the evaluation of patients at risk for COVID-19 are in a state of rapid change based on information released by regulatory bodies including the CDC and federal and state organizations. These policies and algorithms were followed during the patient's care in the ED.    Patient comes in with worsening weakness and difficulties ambulating.  This been going on for 2 to 3 days.  Therefore not in the window for tPA and stroke code was not called.  Will get CT head to evaluate for intercranial hemorrhage, mass, other acute pathology.  Will get labs to evaluate for left abnormalities, AKI, UTI.  We will keep patient on cardiac monitor to  evaluate for cardiorespiratory compromise.  9:03 AM CT scan does show a mass in the left frontal lobe with some hemorrhagic conversion and midline shift of 1 cm.  I did update patient on results of CT.   D/w dr. Izora Ribas from neurosurg.  Given the extent of the mass and patient's comorbidities and functional status at baseline patient would not be a good surgical candidate but could offer patient radiation.  After much discussion with patient and brother who is his POA at bedside and family have elected to hold off on radiation.  They would like to pursue hospice.  They are not interested in MRI.  I did call patient's living facility and they state that they can take care of patient.  I also consulted social work who came down to see patient and to get hospice set up at [facility].  I also did discuss with Dr. Rogue Bussing so they were aware from oncology standpoint.  Per neurosurgery Dr. Cari Caraway recommended  patient on 2 days of oral steroids and wean down to 2 mg twice daily and then can have this adjusted by hospice or oncology.  Also started him on Keppra to prevent seizures.  Family has been updated throughout and they feel comfortable with this plan.  On review of records patient is already on [place] at 500 twice a day and  I was able to review his paperwork for the facility and states that he last got this 4/20 therefore I will not write a prescription for this.  I discussed the provisional nature of ED diagnosis, the treatment so far, the ongoing plan of care, follow up appointments and return precautions with the patient and any family or support people present. They expressed understanding and agreed with the plan, discharged home.       ________________________________________   FINAL CLINICAL IMPRESSION(S) / ED DIAGNOSES   Final diagnoses:  Brain metastasis ([place])  Weakness  Malignant neoplasm of lung, unspecified laterality, unspecified part of lung ([place])      MEDICATIONS GIVEN DURING THIS VISIT:  Medications  dexamethasone (DECADRON) injection 10 mg (10 mg Intravenous Given 08/06/20 1008)  levETIRAcetam (KEPPRA) IVPB 500 mg/100 mL premix (500 mg Intravenous New Bag/Given 08/06/20 1009)     ED Discharge Orders         Ordered    dexamethasone (DECADRON) 2 MG tablet        08/06/20 1201           Note:  This document was prepared using Dragon voice recognition software and may include unintentional dictation errors.   Vanessa [place], MD 08/06/20 [phone]

## 2020-08-06 NOTE — Consult Note (Signed)
On 4/21- As per Dr.Funke- pt /brother declines any treatment options including radiation given his poor baseline given poor quality of life issues [hx of stroke wit residual deficits]. Hospice is recommended/ at [facility] [employer]. Continue steroids/ taper per clinical status per hospice. Recommend social work consult.   GB

## 2020-08-06 NOTE — TOC Initial Note (Addendum)
Transition of Care [facility]) - Initial/Assessment Note    Patient Details  Name: BJ MORLOCK MRN: [id number] Date of Birth: [birth year]  Transition of Care [facility]) CM/SW Contact:    Ova Freshwater Phone Number: [phone] 08/06/2020, 11:01 AM  Clinical Narrative:                  Patient presents to [facility] ED from [facility] due to increased weakness and lethargy the past 3 days. Patient has requested to return to [facility] with hospice care.  CSW contacted Dawn from [facility], who stated she would meet the patient this morning.  CSW contacted and left voicemail for Hilda Blades at [facility] requesting room and report numbers.    Barriers to Discharge: No Barriers Identified   Patient Goals and CMS Choice Patient states their goals for this hospitalization and ongoing recovery are:: Patient will return to [facility] with hospice      Expected Discharge Plan and Services                                                Prior Living Arrangements/Services                       Activities of Daily Living      Permission Sought/Granted                  Emotional Assessment              Admission diagnosis:  ams Patient Active Problem List   Diagnosis Date Noted  . Primary cancer of left upper lobe of lung ([place]) 09/01/2019  . Goals of care, counseling/discussion 09/01/2019  . Mass of upper lobe of left lung 08/09/2019  . GERD (gastroesophageal reflux disease) 09/12/2018  . Hyperlipidemia 09/12/2018  . 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 Acute respiratory failure with hypoxia ([place]) 09/12/2018  . COVID-19 virus infection 09/12/2018  . Acute respiratory disease due to COVID-19 virus 09/12/2018  . Aspiration into airway   . Benign essential HTN   . Tobacco abuse   . Seizures ([place])   . Tachypnea   . Bradycardia   . Diastolic dysfunction   . Hypernatremia   . Malnutrition of moderate degree 09/07/2017  .  Encounter for orogastric (OG) tube placement   . Cerebral edema ([place]) 09/05/2017  . Stroke (cerebrum) ([place]) 09/04/2017  . Acute ischemic stroke ([place]) 09/04/2017   PCP:  [name], MD Pharmacy:  No Pharmacies Listed    Social Determinants of Health (SDOH) Interventions    Readmission Risk Interventions No flowsheet data found.

## 2020-08-06 NOTE — TOC Transition Note (Signed)
Transition of Care [facility]) - CM/SW Discharge Note   Patient Details  Name: Dillon Garcia MRN: [id number] Date of Birth: 1955-04-20  Transition of Care [facility]) CM/SW Contact:  Ova Freshwater Phone Number:  [phone] 08/06/2020, 11:17 AM   Clinical Narrative:     Patient will d/c back to [facility], room 124A, report# [phone].  EDP/ED staff updated.     Barriers to Discharge: No Barriers Identified   Patient Goals and CMS Choice Patient states their goals for this hospitalization and ongoing recovery are:: Patient will return to [facility] with hospice      Discharge Placement                       Discharge Plan and Services                                     Social Determinants of Health (SDOH) Interventions     Readmission Risk Interventions No flowsheet data found.

## 2020-08-06 NOTE — ED Notes (Signed)
Called [facility] for transport to [place]

## 2020-08-06 NOTE — Discharge Instructions (Addendum)
  Take the Decadron to help with swelling in the brain.  Note that the dosage is different after 2 days.  See if this help improves his symptoms and then discuss with the hospice team whether or not they should continue the steroids.   He will need to be followed up with either oncology or the hospice doctor in 7 days to decide if the steroids need to be continued  Neurosurgery also recommended Keppra 500 twice daily but it looks like patient was already on that at your facility.  Make sure to continue this.  Patient has elected not to want further work-up or radiation for this   1. Large, heterogeneous and hemorrhagic left inferior frontal mass with vasogenic edema and 1 cm of midline shift. 2. Ovoid low-density mass in the vermis measuring 2 cm. 3. Above findings are consistent with metastatic disease in this patient with recently treated lung cancer. 4. Remote left cerebellar and brainstem infarct.

## 2020-08-06 NOTE — ED Notes (Signed)
Pt to CT

## 2020-08-07 ENCOUNTER — Non-Acute Institutional Stay: Payer: Medicare Other | Admitting: Primary Care

## 2020-08-07 DIAGNOSIS — Z515 Encounter for palliative care: Secondary | ICD-10-CM

## 2020-08-07 DIAGNOSIS — C3412 Malignant neoplasm of upper lobe, left bronchus or lung: Secondary | ICD-10-CM

## 2020-08-07 NOTE — Progress Notes (Addendum)
    [employer] Palliative Care Consult Note Telephone: [phone]  Fax: [phone]    Date of encounter: 08/07/20 PATIENT NAME: Dillon Garcia [address] [place] [address]   [phone] (home)  DOB: 1955-11-26 MRN: [id number] PRIMARY CARE PROVIDER:    [name], MD,  [place] [address]:   [name], [place],  [place] [address] [phone]  RESPONSIBLE PARTY:    Contact Information    Name Relation Home Work [address]   [place], [place] Brother   [phone]       I met face to face with patient in [facility] facility. Palliative Care was asked to follow this patient by consultation request of  Mliss Fritz MD to address advance care planning and complex medical decision making. This is a follow up visit.                                   ASSESSMENT AND PLAN / RECOMMENDATIONS:    Advance Care Planning/Goals of Care: Goals include to maximize quality of life and symptom management.   CODE STATUS: DNR  Spoke with POA Danny Mey, re my role as PC NP and recommendation for hospice. Have also informed outpatient oncology team of upcoming plan.  Symptom Management/Plan:  Patient sitting in chair, new deficits due to brain mets dx at ED yesterday. Non verbal now where as at baseline he could speak.  This PC pt has been followed for monitoring of cancer advancement.  Staff informs of hospice admission tomorrow with [facility] hospice. Will d/c from [place] palliative care with hospice admission.   Follow up Palliative Care Visit: d/c for hospice admission.  I spent 15 minutes providing this consultation. More than 50% of the time in this consultation was spent in counseling and care coordination.  PPS: 30%  HOSPICE ELIGIBILITY/DIAGNOSIS: TBD  Chief Complaint: seizures  HISTORY OF PRESENT ILLNESS:  Dillon Garcia is a 65 y.o. year old male  with recent seizures,  was found to have brain mets at ED. Has been referred for hospice admission. Today he is alert, and aware, interactive but non verbal.  No pain on PAINAD scale  History obtained from review of EMR, discussion with primary team, and interview with family, facility staff/caregiver and/or Mr. Amores.  I reviewed available labs, medications, imaging, studies and related documents from the EMR. Records reviewed and summarized above.    Thank you for the opportunity to participate in the care of Mr. Mathews.  The palliative care team will continue to follow. Please call our office at [phone] if we can be of additional assistance.   Jason Coop, NP , DNP, MPH, AGPCNP-BC, ACHPN  COVID-19 PATIENT SCREENING TOOL Asked and negative response unless otherwise noted:   Have you had symptoms of covid, tested positive or been in contact with someone with symptoms/positive test in the past 5-10 days?

## 2020-09-16 DEATH — deceased

## 2020-09-23 ENCOUNTER — Ambulatory Visit: Payer: Medicare Other

## 2020-09-24 ENCOUNTER — Telehealth: Payer: Self-pay | Admitting: Primary Care

## 2020-09-24 NOTE — Telephone Encounter (Signed)
Patient deceased 08/20/2020.

## 2020-09-25 ENCOUNTER — Ambulatory Visit: Payer: Medicare Other | Admitting: Internal Medicine

## 2020-09-25 ENCOUNTER — Encounter: Payer: Medicare Other | Admitting: Hospice and Palliative Medicine

## 2020-09-25 ENCOUNTER — Other Ambulatory Visit: Payer: Medicare Other

## 2021-07-11 IMAGING — CT CT HEAD W/O CM
3 series · 15 of 46 positions shown, 18 images · non-contrast
Comparison: 08/29/2019

CLINICAL DATA: Mental status change with unknown cause

EXAM:
CT HEAD WITHOUT CONTRAST
TECHNIQUE: Contiguous axial images were obtained from the base of the skull
through the vertex without intravenous contrast.

[Series 2: head wo · axial · 0.42mm/px · z∈[+1538,+1658]mm · 9 of 29 slices shown, 12 images]
[im 3/29  brain]
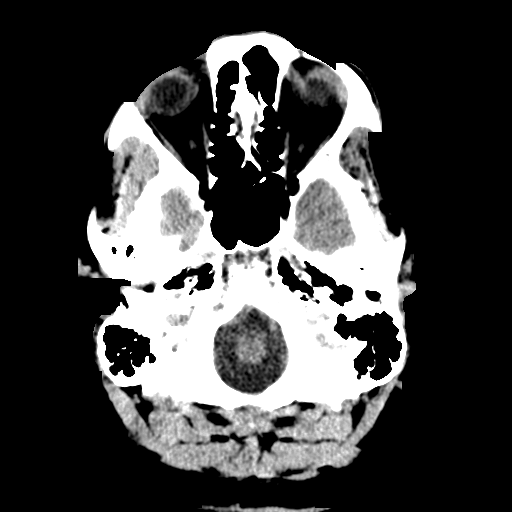
[im 3/29  bone]
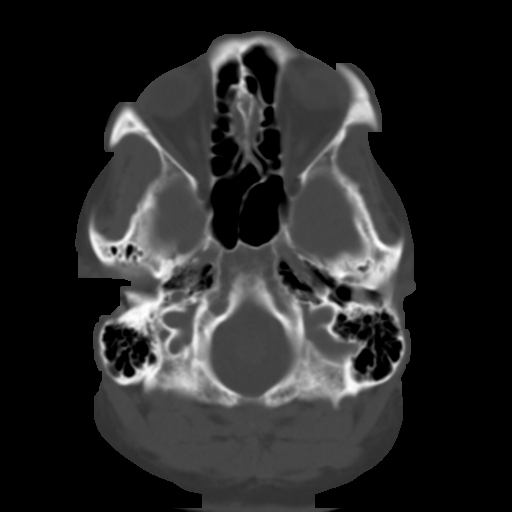
[im 6/29  brain]
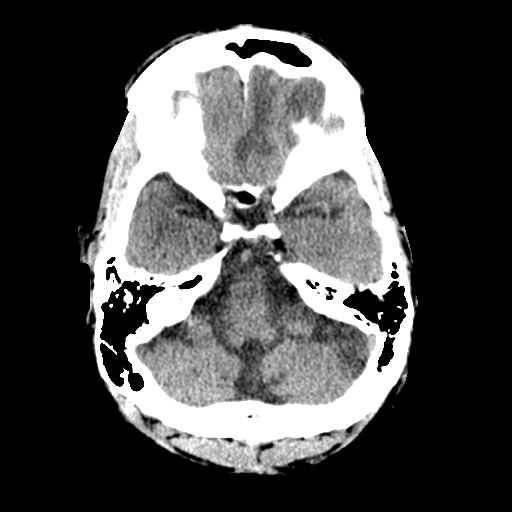
[im 9/29  brain]
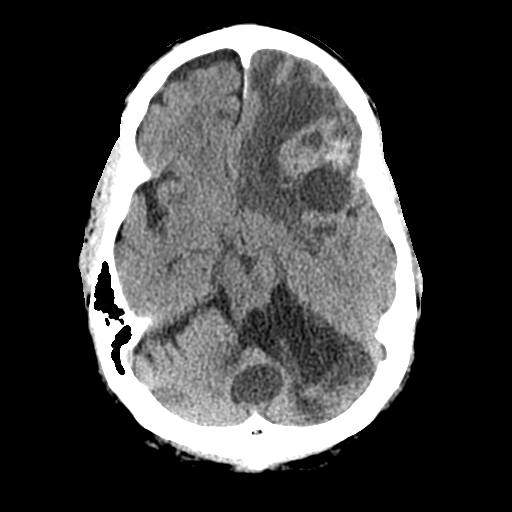
[im 12/29  brain]
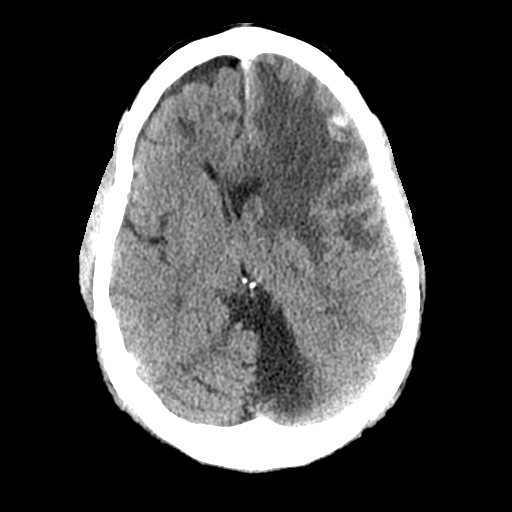
[im 15/29  brain]
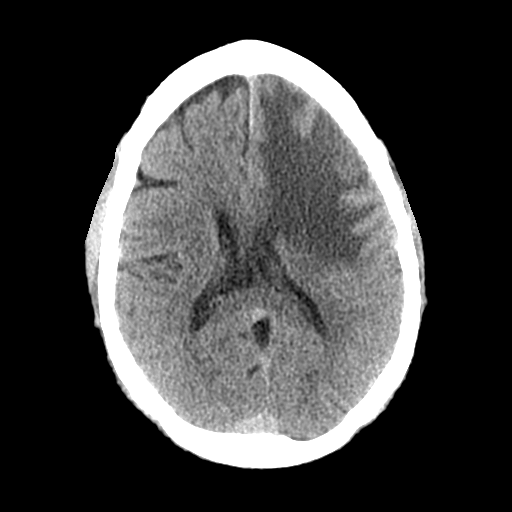
[im 15/29  bone]
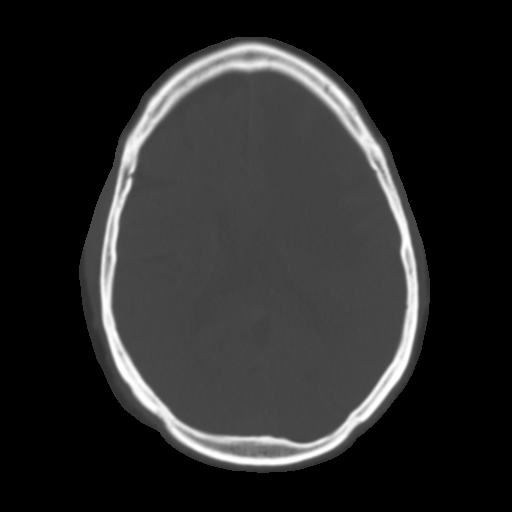
[im 18/29  brain]
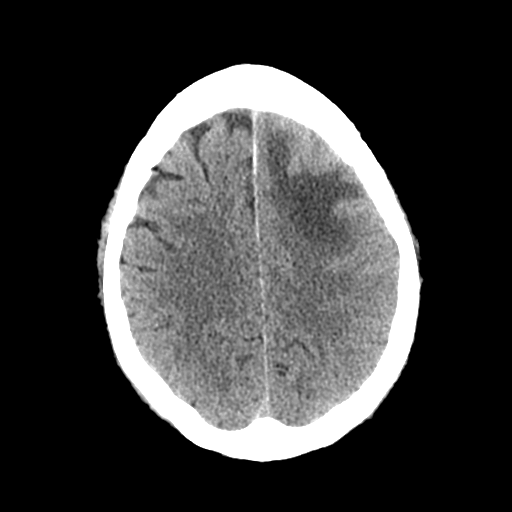
[im 21/29  brain]
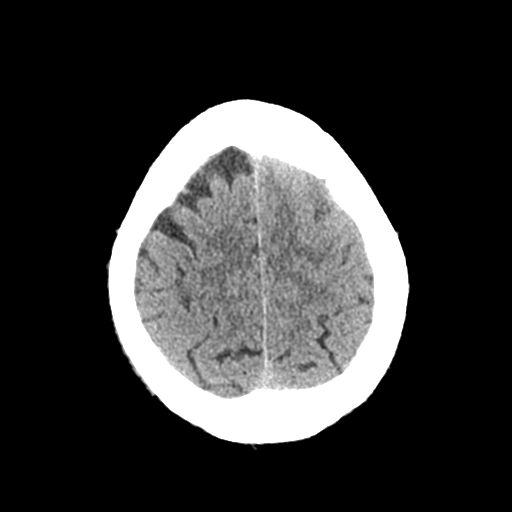
[im 24/29  brain]
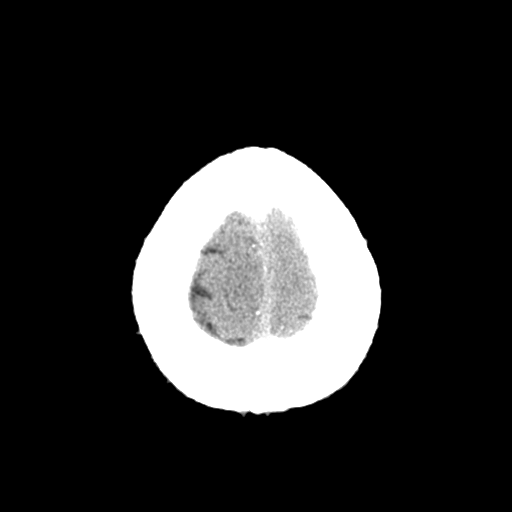
[im 27/29  brain]
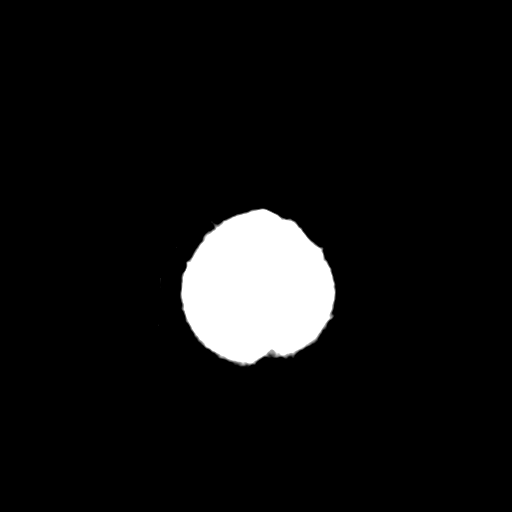
[im 27/29  bone]
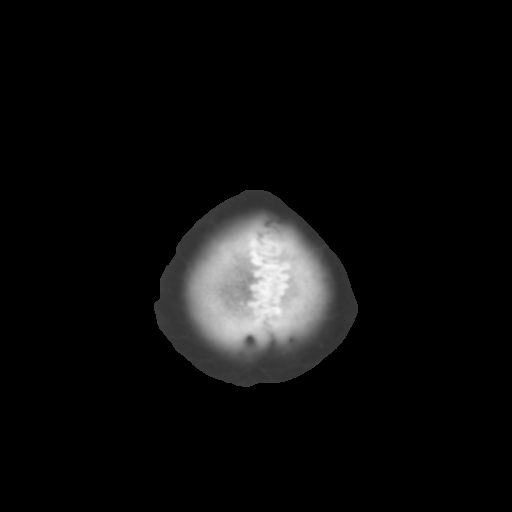

[Series 4: coronal soft tissue · coronal · 0.30mm/px · 3 of 68 slices shown]
[im 23/68  brain]
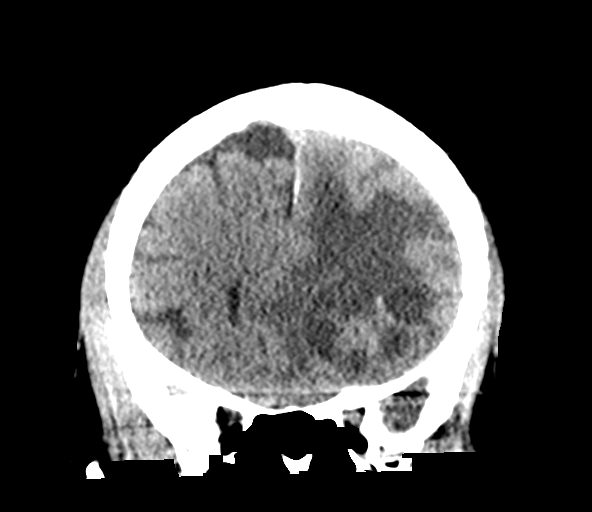
[im 30/68  brain]
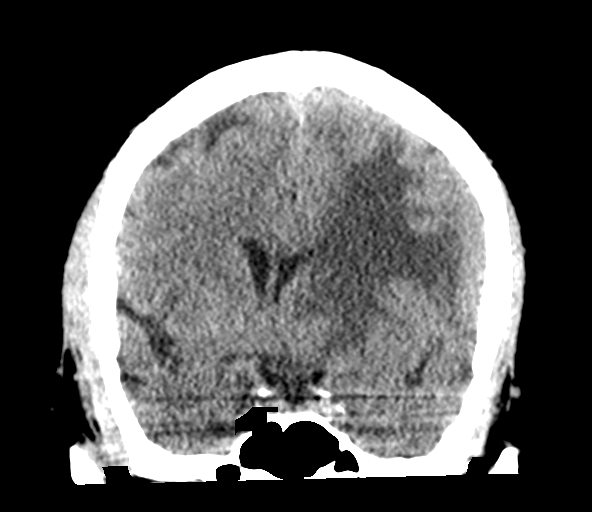
[im 38/68  brain]
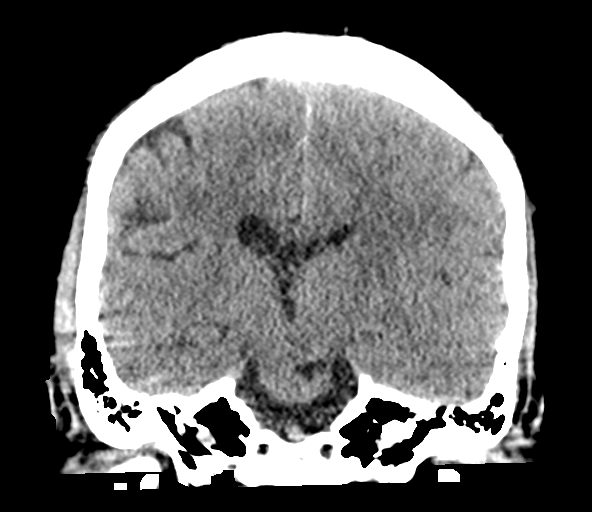

[Series 5: sagittal soft tissue · sagittal · 0.30mm/px · 3 of 58 slices shown]
[im 20/58  brain]
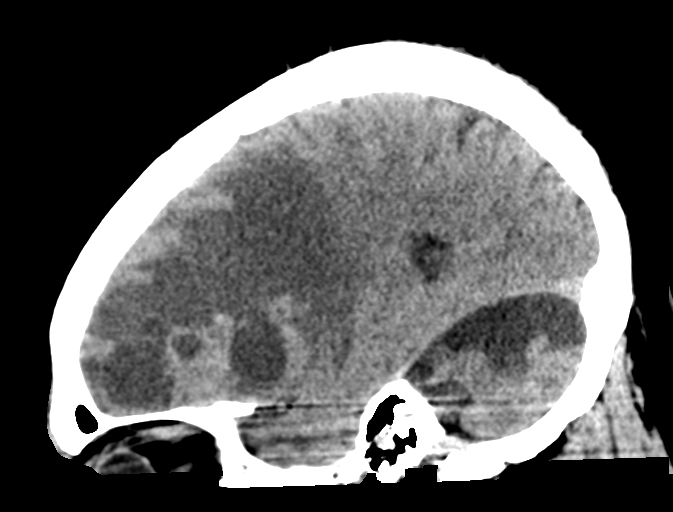
[im 29/58  brain]
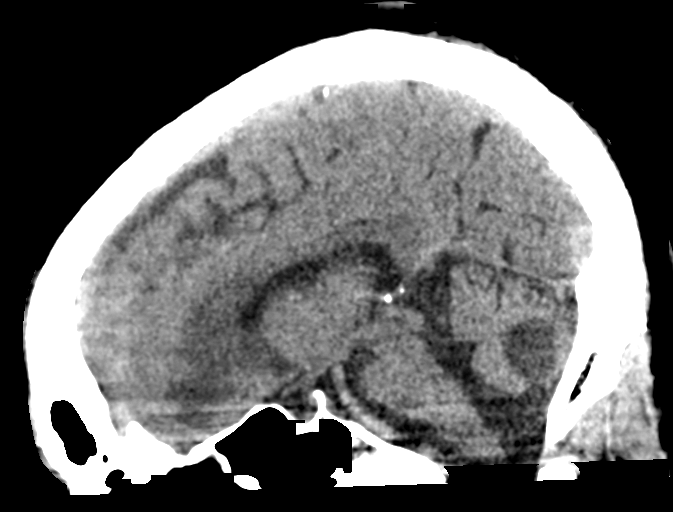
[im 39/58  brain]
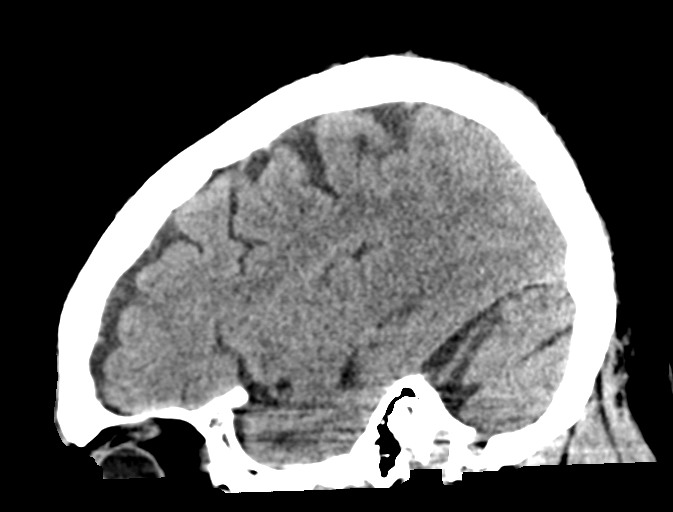

[15 of 46 positions shown; findings below may reference images not displayed]

FINDINGS: Brain: Heterogeneous, solid and cystic appearing mass in the
inferior and anterior left frontal lobe with peripheral hemorrhagic
components. The isodense component alone measures up to 4 cm.
Vasogenic edema with local mass effect and midline shift measuring 1
cm. No entrapment or ACA territory infarct.

Rounded low-density area newly seen in the vermis measuring 2 cm.

Remote left cerebellar infarct with wallerian degeneration seen into
the pons.

Vascular: Negative

Skull: No visible bony metastasis

Sinuses/Orbits: Negative

Other: Critical Value/emergent results were called by telephone at
the time of interpretation on 08/06/2020 at [DATE] to provider JEANRENOLD
AUAD , who verbally acknowledged these results.
IMPRESSION: 1. Large, heterogeneous and hemorrhagic left inferior frontal mass
with vasogenic edema and 1 cm of midline shift.
2. Ovoid low-density mass in the vermis measuring 2 cm.
3. Above findings are consistent with metastatic disease in this
patient with recently treated lung cancer.
4. Remote left cerebellar and brainstem infarct.

## 2022-08-31 ENCOUNTER — Other Ambulatory Visit: Payer: Self-pay
